# Patient Record
Sex: Male | Born: 1965 | Race: Black or African American | Hispanic: No | Marital: Married | Smoking: Former smoker
Health system: Southern US, Community
[De-identification: ages and names within clinical notes are randomized; demographics above are authoritative.]

## PROBLEM LIST (undated history)

## (undated) DIAGNOSIS — G4700 Insomnia, unspecified: Secondary | ICD-10-CM

## (undated) DIAGNOSIS — R011 Cardiac murmur, unspecified: Secondary | ICD-10-CM

## (undated) DIAGNOSIS — G4733 Obstructive sleep apnea (adult) (pediatric): Secondary | ICD-10-CM

## (undated) DIAGNOSIS — I2089 Other forms of angina pectoris: Secondary | ICD-10-CM

## (undated) DIAGNOSIS — F419 Anxiety disorder, unspecified: Secondary | ICD-10-CM

## (undated) DIAGNOSIS — I25118 Atherosclerotic heart disease of native coronary artery with other forms of angina pectoris: Secondary | ICD-10-CM

## (undated) DIAGNOSIS — F1290 Cannabis use, unspecified, uncomplicated: Secondary | ICD-10-CM

## (undated) DIAGNOSIS — Z7289 Other problems related to lifestyle: Secondary | ICD-10-CM

## (undated) DIAGNOSIS — Z7901 Long term (current) use of anticoagulants: Secondary | ICD-10-CM

## (undated) DIAGNOSIS — E119 Type 2 diabetes mellitus without complications: Secondary | ICD-10-CM

## (undated) DIAGNOSIS — J45909 Unspecified asthma, uncomplicated: Secondary | ICD-10-CM

## (undated) DIAGNOSIS — M1990 Unspecified osteoarthritis, unspecified site: Secondary | ICD-10-CM

## (undated) DIAGNOSIS — G4730 Sleep apnea, unspecified: Secondary | ICD-10-CM

## (undated) DIAGNOSIS — I700 Atherosclerosis of aorta: Secondary | ICD-10-CM

## (undated) DIAGNOSIS — M109 Gout, unspecified: Secondary | ICD-10-CM

## (undated) DIAGNOSIS — T7840XA Allergy, unspecified, initial encounter: Secondary | ICD-10-CM

## (undated) DIAGNOSIS — J439 Emphysema, unspecified: Secondary | ICD-10-CM

## (undated) DIAGNOSIS — M5030 Other cervical disc degeneration, unspecified cervical region: Secondary | ICD-10-CM

## (undated) DIAGNOSIS — I70213 Atherosclerosis of native arteries of extremities with intermittent claudication, bilateral legs: Secondary | ICD-10-CM

## (undated) DIAGNOSIS — Z7902 Long term (current) use of antithrombotics/antiplatelets: Secondary | ICD-10-CM

## (undated) DIAGNOSIS — J302 Other seasonal allergic rhinitis: Secondary | ICD-10-CM

## (undated) DIAGNOSIS — H9319 Tinnitus, unspecified ear: Secondary | ICD-10-CM

## (undated) DIAGNOSIS — Z789 Other specified health status: Secondary | ICD-10-CM

## (undated) DIAGNOSIS — E785 Hyperlipidemia, unspecified: Secondary | ICD-10-CM

## (undated) DIAGNOSIS — I10 Essential (primary) hypertension: Secondary | ICD-10-CM

## (undated) DIAGNOSIS — K219 Gastro-esophageal reflux disease without esophagitis: Secondary | ICD-10-CM

## (undated) HISTORY — DX: Sleep apnea, unspecified: G47.30

## (undated) HISTORY — PX: KNEE SURGERY: SHX244

## (undated) HISTORY — PX: WISDOM TOOTH EXTRACTION: SHX21

## (undated) HISTORY — PX: BACK SURGERY: SHX140

## (undated) HISTORY — PX: CERVICAL DISC SURGERY: SHX588

## (undated) HISTORY — PX: ROTATOR CUFF REPAIR: SHX139

## (undated) HISTORY — DX: Allergy, unspecified, initial encounter: T78.40XA

## (undated) HISTORY — DX: Unspecified asthma, uncomplicated: J45.909

---

## 2007-03-31 DIAGNOSIS — M109 Gout, unspecified: Secondary | ICD-10-CM | POA: Insufficient documentation

## 2007-03-31 DIAGNOSIS — F32A Depression, unspecified: Secondary | ICD-10-CM | POA: Insufficient documentation

## 2008-11-27 DIAGNOSIS — Z9889 Other specified postprocedural states: Secondary | ICD-10-CM

## 2008-11-27 HISTORY — DX: Other specified postprocedural states: Z98.890

## 2008-11-27 HISTORY — PX: LEFT HEART CATH AND CORONARY ANGIOGRAPHY: CATH118249

## 2009-07-30 DIAGNOSIS — J309 Allergic rhinitis, unspecified: Secondary | ICD-10-CM | POA: Insufficient documentation

## 2009-12-17 DIAGNOSIS — F411 Generalized anxiety disorder: Secondary | ICD-10-CM | POA: Insufficient documentation

## 2009-12-23 ENCOUNTER — Encounter: Admission: RE | Admit: 2009-12-23 | Discharge: 2009-12-23 | Payer: Self-pay | Admitting: Sports Medicine

## 2010-01-22 ENCOUNTER — Ambulatory Visit (HOSPITAL_COMMUNITY): Admission: RE | Admit: 2010-01-22 | Discharge: 2010-01-23 | Payer: Self-pay | Admitting: Orthopedic Surgery

## 2010-04-15 DIAGNOSIS — L309 Dermatitis, unspecified: Secondary | ICD-10-CM | POA: Insufficient documentation

## 2010-05-03 ENCOUNTER — Encounter: Admission: RE | Admit: 2010-05-03 | Discharge: 2010-05-03 | Payer: Self-pay | Admitting: Sports Medicine

## 2010-11-05 ENCOUNTER — Encounter: Payer: Self-pay | Admitting: Neurology

## 2010-12-23 LAB — BASIC METABOLIC PANEL
BUN: 6 mg/dL (ref 6–23)
Calcium: 8.9 mg/dL (ref 8.4–10.5)
Creatinine, Ser: 0.87 mg/dL (ref 0.4–1.5)
GFR calc non Af Amer: >60 mL/min (ref >60)
Glucose, Bld: 170 mg/dL — ABNORMAL HIGH (ref 70–99)

## 2010-12-23 LAB — CBC
HCT: 41 % (ref 39.0–52.0)
MCV: 83.7 fL (ref 78.0–100.0)
Platelets: 221 10*3/uL (ref 150–400)
Platelets: 248 10*3/uL (ref 150–400)
RBC: 4.78 MIL/uL (ref 4.22–5.81)
RBC: 4.9 MIL/uL (ref 4.22–5.81)
WBC: 8.4 10*3/uL (ref 4.0–10.5)

## 2010-12-23 LAB — COMPREHENSIVE METABOLIC PANEL
AST: 30 U/L (ref 0–37)
Albumin: 4.3 g/dL (ref 3.5–5.2)
Alkaline Phosphatase: 62 U/L (ref 39–117)
CO2: 26 mEq/L (ref 19–32)
Calcium: 10 mg/dL (ref 8.4–10.5)
Chloride: 107 mEq/L (ref 96–112)
GFR calc Af Amer: >60 mL/min (ref >60)
GFR calc non Af Amer: >60 mL/min (ref >60)
Glucose, Bld: 124 mg/dL — ABNORMAL HIGH (ref 70–99)
Potassium: 5.1 mEq/L (ref 3.5–5.1)
Sodium: 138 mEq/L (ref 135–145)

## 2010-12-23 LAB — GLUCOSE, CAPILLARY
Glucose-Capillary: 142 mg/dL — ABNORMAL HIGH (ref 70–99)
Glucose-Capillary: 170 mg/dL — ABNORMAL HIGH (ref 70–99)
Glucose-Capillary: 172 mg/dL — ABNORMAL HIGH (ref 70–99)

## 2010-12-23 LAB — PROTIME-INR: INR: 0.98 (ref 0.00–1.49)

## 2011-01-22 DIAGNOSIS — E1169 Type 2 diabetes mellitus with other specified complication: Secondary | ICD-10-CM | POA: Insufficient documentation

## 2012-10-05 HISTORY — PX: ANTERIOR CERVICAL DECOMP/DISCECTOMY FUSION: SHX1161

## 2013-03-16 ENCOUNTER — Encounter: Payer: Self-pay | Admitting: *Deleted

## 2013-03-16 ENCOUNTER — Emergency Department (INDEPENDENT_AMBULATORY_CARE_PROVIDER_SITE_OTHER)
Admission: EM | Admit: 2013-03-16 | Discharge: 2013-03-16 | Disposition: A | Discharge status: Home / Self Care | Payer: BC Managed Care – PPO | Source: Home / Self Care | Attending: Family Medicine | Admitting: Family Medicine

## 2013-03-16 DIAGNOSIS — Z716 Tobacco abuse counseling: Secondary | ICD-10-CM

## 2013-03-16 DIAGNOSIS — F17200 Nicotine dependence, unspecified, uncomplicated: Secondary | ICD-10-CM

## 2013-03-16 DIAGNOSIS — J069 Acute upper respiratory infection, unspecified: Secondary | ICD-10-CM

## 2013-03-16 DIAGNOSIS — R062 Wheezing: Secondary | ICD-10-CM

## 2013-03-16 HISTORY — DX: Hyperlipidemia, unspecified: E78.5

## 2013-03-16 HISTORY — DX: Type 2 diabetes mellitus without complications: E11.9

## 2013-03-16 HISTORY — DX: Essential (primary) hypertension: I10

## 2013-03-16 MED ORDER — AZITHROMYCIN 250 MG PO TABS: ORAL_TABLET | ORAL | Status: DC

## 2013-03-16 MED ORDER — METHYLPREDNISOLONE ACETATE 80 MG/ML IJ SUSP
80.0000 mg | Freq: Once | INTRAMUSCULAR | Status: AC
  Administered 2013-03-16: 80 mg via INTRAMUSCULAR

## 2013-03-16 NOTE — ED Notes (Signed)
Sophia reports cough and congestion with HA x last night. Denies fever. Taken generic sinus and tussin OTC.

## 2013-03-16 NOTE — ED Provider Notes (Addendum)
History     CSN: [id number]  Arrival date & time 03/16/13  1728   First MD Initiated Contact with Patient 03/16/13 1729      Chief Complaint  Patient presents with  . URI   HPI  URI Symptoms Onset: 2 days  Description: rhinorrhea, nasal congestion, cough, wheezing  Modifying factors:  1/2 PPD smoker. Baseline diabetic, overall well controlled.   Symptoms Nasal discharge: yes Fever: no Sore throat: yes Cough: yes Wheezing: yes Ear pain: no GI symptoms: no Sick contacts: yes  Red Flags  Stiff neck: no Dyspnea: minimal  Rash: no Swallowing difficulty: no  Sinusitis Risk Factors Headache/face pain: no Double sickening: no tooth pain: no  Allergy Risk Factors Sneezing: no Itchy scratchy throat: no Seasonal symptoms: no  Flu Risk Factors Headache: no muscle aches: no severe fatigue: no   Past Medical History  Diagnosis Date  . Diabetes mellitus without complication   . Hypertension   . Hyperlipidemia     Past Surgical History  Procedure Laterality Date  . Rotator cuff repair    . Back surgery    . Knee surgery Right     Family History  Problem Relation Age of Onset  . Hypertension Mother   . Heart attack Father     History  Substance Use Topics  . Smoking status: Current Every Day Smoker -- 0.50 packs/day    Types: Cigarettes  . Smokeless tobacco: Never Used  . Alcohol Use: Yes     Comment: 6/week      Review of Systems  All other systems reviewed and are negative.    Allergies  Review of patient's allergies indicates no known allergies.  Home Medications   Current Outpatient Rx  Name  Route  Sig  Dispense  Refill  . albuterol (PROVENTIL) (2.5 MG/3ML) 0.083% nebulizer solution   Nebulization   Take 2.5 mg by nebulization every 6 (six) hours as needed for wheezing.         Marland Kitchen ALPRAZolam (XANAX) 0.25 MG tablet   Oral   Take 0.25 mg by mouth at bedtime as needed for sleep.         Marland Kitchen aspirin 81 MG tablet   Oral   Take 81  mg by mouth daily.         . cetirizine (ZYRTEC) 10 MG tablet   Oral   Take 10 mg by mouth daily.         Marland Kitchen glipiZIDE (GLUCOTROL XL) 10 MG 24 hr tablet   Oral   Take 10 mg by mouth daily.         . hydrochlorothiazide (HYDRODIURIL) 25 MG tablet   Oral   Take 25 mg by mouth daily.         Marland Kitchen lisinopril (PRINIVIL,ZESTRIL) 40 MG tablet   Oral   Take 40 mg by mouth daily.         . metFORMIN (GLUCOPHAGE) 500 MG tablet   Oral   Take 500 mg by mouth 2 (two) times daily with a meal.         . rosuvastatin (CRESTOR) 40 MG tablet   Oral   Take 40 mg by mouth daily.         . vitamin C (ASCORBIC ACID) 500 MG tablet   Oral   Take 500 mg by mouth daily.           BP 117/74  Pulse 100  Temp(Src) 99.2 F (37.3 C) (Oral)  Resp 16  Ht 5\' 10"  (1.778 m)  Wt 242 lb (109.77 kg)  BMI 34.72 kg/m2  SpO2 99%  Physical Exam  Constitutional:  Obese    HENT:  Head: Normocephalic and atraumatic.  Right Ear: External ear normal.  Left Ear: External ear normal.  +nasal erythema, rhinorrhea bilaterally, + post oropharyngeal erythema    Eyes: Conjunctivae are normal. Pupils are equal, round, and reactive to light.  Neck: Normal range of motion.  Cardiovascular: Normal rate and regular rhythm.   Pulmonary/Chest: Effort normal. He has wheezes.  Abdominal: Soft.  Musculoskeletal: Normal range of motion.  Neurological: He is alert.  Skin: Skin is warm.    ED Course  Procedures (including critical care time)  Labs Reviewed - No data to display No results found.   1. URI (upper respiratory infection)   2. Wheezing   3. Tobacco abuse counseling       MDM  Suspect undelrying viral illness with ? Secondary obstructive lung disease exacerbation.  Depomedrol 80mg  IM x1 given wheezing. Watch sugars with this medication.  Discussed infectious and resp red flags. Also discussed smoking cessation.  Zpak if sxs worsen as pt is going to [place] in next 2-3 days.  Follow  up as needed.      The patient and/or caregiver has been counseled thoroughly with regard to treatment plan and/or medications prescribed including dosage, schedule, interactions, rationale for use, and possible side effects and they verbalize understanding. Diagnoses and expected course of recovery discussed and will return if not improved as expected or if the condition worsens. Patient and/or caregiver verbalized understanding.             Doree Albee, MD 03/16/13 1753  Doree Albee, MD 03/16/13 1754

## 2014-07-17 DIAGNOSIS — R0683 Snoring: Secondary | ICD-10-CM | POA: Insufficient documentation

## 2014-07-17 DIAGNOSIS — G4710 Hypersomnia, unspecified: Secondary | ICD-10-CM

## 2014-07-17 DIAGNOSIS — G4733 Obstructive sleep apnea (adult) (pediatric): Secondary | ICD-10-CM | POA: Insufficient documentation

## 2014-07-17 HISTORY — DX: Hypersomnia, unspecified: G47.10

## 2016-07-17 ENCOUNTER — Encounter: Payer: Self-pay | Admitting: Nurse Practitioner

## 2017-11-24 DIAGNOSIS — M1A00X0 Idiopathic chronic gout, unspecified site, without tophus (tophi): Secondary | ICD-10-CM | POA: Insufficient documentation

## 2018-02-21 DIAGNOSIS — Z7689 Persons encountering health services in other specified circumstances: Secondary | ICD-10-CM | POA: Insufficient documentation

## 2018-06-17 ENCOUNTER — Other Ambulatory Visit: Payer: Self-pay

## 2018-06-17 ENCOUNTER — Encounter: Payer: Self-pay | Admitting: Emergency Medicine

## 2018-06-17 ENCOUNTER — Ambulatory Visit
Admission: EM | Admit: 2018-06-17 | Discharge: 2018-06-17 | Disposition: A | Discharge status: Home / Self Care | Payer: 59 | Attending: Family Medicine | Admitting: Family Medicine

## 2018-06-17 DIAGNOSIS — Z79899 Other long term (current) drug therapy: Secondary | ICD-10-CM | POA: Diagnosis not present

## 2018-06-17 DIAGNOSIS — R142 Eructation: Secondary | ICD-10-CM | POA: Diagnosis not present

## 2018-06-17 DIAGNOSIS — Z87891 Personal history of nicotine dependence: Secondary | ICD-10-CM | POA: Insufficient documentation

## 2018-06-17 DIAGNOSIS — I10 Essential (primary) hypertension: Secondary | ICD-10-CM | POA: Diagnosis not present

## 2018-06-17 DIAGNOSIS — E119 Type 2 diabetes mellitus without complications: Secondary | ICD-10-CM | POA: Insufficient documentation

## 2018-06-17 DIAGNOSIS — E785 Hyperlipidemia, unspecified: Secondary | ICD-10-CM | POA: Diagnosis not present

## 2018-06-17 DIAGNOSIS — Z8249 Family history of ischemic heart disease and other diseases of the circulatory system: Secondary | ICD-10-CM | POA: Insufficient documentation

## 2018-06-17 DIAGNOSIS — Z7984 Long term (current) use of oral hypoglycemic drugs: Secondary | ICD-10-CM | POA: Diagnosis not present

## 2018-06-17 DIAGNOSIS — Z7982 Long term (current) use of aspirin: Secondary | ICD-10-CM | POA: Insufficient documentation

## 2018-06-17 DIAGNOSIS — K219 Gastro-esophageal reflux disease without esophagitis: Secondary | ICD-10-CM | POA: Insufficient documentation

## 2018-06-17 DIAGNOSIS — R0789 Other chest pain: Secondary | ICD-10-CM | POA: Diagnosis not present

## 2018-06-17 HISTORY — DX: Cardiac murmur, unspecified: R01.1

## 2018-06-17 LAB — CBC WITH DIFFERENTIAL/PLATELET
Basophils Absolute: 0.1 10*3/uL (ref 0–0.1)
Basophils Relative: 1 %
EOS ABS: 0.3 10*3/uL (ref 0–0.7)
EOS PCT: 4 %
HCT: 45.4 % (ref 40.0–52.0)
Hemoglobin: 14.9 g/dL (ref 13.0–18.0)
LYMPHS ABS: 3.9 10*3/uL — AB (ref 1.0–3.6)
LYMPHS PCT: 55 %
MCH: 28.2 pg (ref 26.0–34.0)
MCHC: 32.8 g/dL (ref 32.0–36.0)
MCV: 86 fL (ref 80.0–100.0)
Monocytes Absolute: 0.5 10*3/uL (ref 0.2–1.0)
Monocytes Relative: 7 %
Neutro Abs: 2.5 10*3/uL (ref 1.4–6.5)
Neutrophils Relative %: 35 %
PLATELETS: 251 10*3/uL (ref 150–440)
RBC: 5.27 MIL/uL (ref 4.40–5.90)
RDW: 13.4 % (ref 11.5–14.5)
WBC: 7.2 10*3/uL (ref 3.8–10.6)

## 2018-06-17 LAB — COMPREHENSIVE METABOLIC PANEL
ALK PHOS: 59 U/L (ref 38–126)
ALT: 38 U/L (ref 0–44)
ANION GAP: 12 (ref 5–15)
AST: 31 U/L (ref 15–41)
Albumin: 4.2 g/dL (ref 3.5–5.0)
BUN: 14 mg/dL (ref 6–20)
CHLORIDE: 106 mmol/L (ref 98–111)
CO2: 21 mmol/L — AB (ref 22–32)
Calcium: 9.4 mg/dL (ref 8.9–10.3)
Creatinine, Ser: 0.92 mg/dL (ref 0.61–1.24)
GFR calc Af Amer: >60 mL/min (ref >60)
Glucose, Bld: 144 mg/dL — ABNORMAL HIGH (ref 70–99)
Potassium: 4.3 mmol/L (ref 3.5–5.1)
SODIUM: 139 mmol/L (ref 135–145)
TOTAL PROTEIN: 7.1 g/dL (ref 6.5–8.1)
Total Bilirubin: 0.6 mg/dL (ref 0.3–1.2)

## 2018-06-17 LAB — TROPONIN I

## 2018-06-17 MED ORDER — PANTOPRAZOLE SODIUM 40 MG PO TBEC: 40.0000 mg | DELAYED_RELEASE_TABLET | Freq: Every day | ORAL | 1 refills | Status: DC

## 2018-06-17 NOTE — ED Triage Notes (Signed)
Patient in today c/o indigestion, heavy type of feeling in his chest x 1 week. Patient states it is worse in the evenings and night when he lies down to sleep. Patient states it does get better after passing gas. Patient has used Catering managerAlka Seltzer and he does feel better. Patient's wife gave him a GI Cocktail which patient states worked [employer]. He celebrated his birthday yesterday and drank beer and ate after 8pm and symptoms were worse last night.

## 2018-06-17 NOTE — Discharge Instructions (Signed)
Medication as prescribed.  If persists, consider seeing cardiology for stress test.  Take care  Dr. Adriana Simasook

## 2018-06-17 NOTE — ED Provider Notes (Signed)
[facility]    CSN: [id number] Arrival date & time: 06/17/18  1354  History   Chief Complaint Chief Complaint  Patient presents with  . Gastroesophageal Reflux   HPI  52 year old male presents with the above complaint.  1 week history of heartburn, belching.  Weight worse in the evenings.  Has been worse at night.  He has been using Alka-Seltzer with improvement but no resolution.  He has also used GI cocktail.  Heartburn is more severe than usual.  He has had some associated diaphoresis.  No shortness of breath.  No exertional component.  He is recently started smoking.  Additionally, he has recently started a new medication (Trulicity).  Pain described as a burning sensation.  Located in the center of the sternum.  No radiation.  No other associated symptoms.  No other complaints.   PMH, Surgical Hx, Family Hx, Social History reviewed and updated as below.  Past Medical History:  Diagnosis Date  . Diabetes mellitus without complication (HCC)   . Heart murmur   . Hyperlipidemia   . Hypertension    Past Surgical History:  Procedure Laterality Date  . BACK SURGERY     cervical  . KNEE SURGERY Right   . ROTATOR CUFF REPAIR    . WISDOM TOOTH EXTRACTION     Home Medications    Prior to Admission medications   Medication Sig Start Date End Date Taking? Authorizing Provider  ALPRAZolam Prudy Feeler(XANAX) 0.25 MG tablet Take 0.25 mg by mouth at bedtime as needed for sleep.   Yes [provider]  aspirin 81 MG tablet Take 81 mg by mouth daily.   Yes [provider]  Canagliflozin-metFORMIN HCl (INVOKAMET PO) Take 2 capsules by mouth daily.   Yes [provider]  cetirizine (ZYRTEC) 10 MG tablet Take 10 mg by mouth daily.   Yes [provider]  glipiZIDE (GLUCOTROL XL) 10 MG 24 hr tablet Take 10 mg by mouth daily.   Yes [provider]  hydrochlorothiazide (HYDRODIURIL) 25 MG tablet Take 25 mg by mouth daily.   Yes [provider]  lisinopril (PRINIVIL,ZESTRIL) 40 MG tablet Take 40 mg by mouth daily.   Yes [provider]  Multiple Vitamin (MULTIVITAMIN) tablet Take 1 tablet by mouth daily.   Yes [provider]  rosuvastatin (CRESTOR) 40 MG tablet Take 40 mg by mouth daily.   Yes [provider]  pantoprazole (PROTONIX) 40 MG tablet Take 1 tablet (40 mg total) by mouth daily. 06/17/18   Tommie Samsook, Damontre Millea G, DO   Family History Family History  Problem Relation Age of Onset  . Heart attack Father   . Hypertension Mother    Social History Social History   Tobacco Use  . Smoking status: Former Smoker    Packs/day: 0.50    Types: Cigarettes    Last attempt to quit: 06/11/2018    Years since quitting: 0.0  . Smokeless tobacco: Never Used  Substance Use Topics  . Alcohol use: Yes    Comment: 6/week  . Drug use: No   Allergies   Patient has no known allergies.  Review of Systems Review of Systems  Constitutional: Negative.   Cardiovascular: Positive for chest pain.  Gastrointestinal:       GERD.   Physical Exam Triage Vital Signs ED Triage Vitals  Enc Vitals Group     BP 06/17/18 1414 99/80     Pulse Rate 06/17/18 1414 71     Resp 06/17/18 1414  16     Temp 06/17/18 1414 97.7 F (36.5 C)     Temp Source 06/17/18 1414 Oral     SpO2 06/17/18 1414 100 %     Weight 06/17/18 1415 240 lb (108.9 kg)     Height 06/17/18 1415 5\' 10"  (1.778 m)     Head Circumference --      Peak Flow --      Pain Score 06/17/18 1414 0     Pain Loc --      Pain Edu? --      Excl. in GC? --    Updated Vital Signs BP 99/80 (BP Location: Left Arm)   Pulse 71   Temp 97.7 F (36.5 C) (Oral)   Resp 16   Ht 5\' 10"  (1.778 m)   Wt 108.9 kg   SpO2 100%   BMI 34.44 kg/m   Visual Acuity Right Eye Distance:   Left Eye Distance:   Bilateral Distance:    Right Eye Near:   Left Eye Near:    Bilateral Near:     Physical Exam  Constitutional: He is oriented to person, place, and time. He appears  well-developed. No distress.  Cardiovascular: Normal rate and regular rhythm.  Pulmonary/Chest: Effort normal and breath sounds normal. He has no wheezes. He has no rales.  Abdominal: Soft. He exhibits no distension. There is no tenderness.  Neurological: He is alert and oriented to person, place, and time.  Psychiatric: He has a normal mood and affect. His behavior is normal.  Nursing note and vitals reviewed.  UC Treatments / Results  Labs (all labs ordered are listed, but only abnormal results are displayed) Labs Reviewed  CBC WITH DIFFERENTIAL/PLATELET - Abnormal; Notable for the following components:      Result Value   Lymphs Abs 3.9 (*)    All other components within normal limits  COMPREHENSIVE METABOLIC PANEL - Abnormal; Notable for the following components:   CO2 21 (*)    Glucose, Bld 144 (*)    All other components within normal limits  TROPONIN I    EKG Interpreted patient: Normal sinus rhythm with rate of 70.  Normal axis.  Normal intervals.  No ST-T wave changes.  Normal EKG.  Radiology No results found.  Procedures Procedures (including critical care time)  Medications Ordered in UC Medications - No data to display  Initial Impression / Assessment and Plan / UC Course  I have reviewed the triage vital signs and the nursing notes.  Pertinent labs & imaging results that were available during my care of the patient were reviewed by me and considered in my medical decision making (see chart for details).    52 year old male presents with GERD.  This is likely exacerbated by Trulicity as well as dietary indiscretion.  Labs and EKG unremarkable.  Placing on Protonix.   Final Clinical Impressions(s) / UC Diagnoses   Final diagnoses:  Gastroesophageal reflux disease without esophagitis     Discharge Instructions     Medication as prescribed.  If persists, consider seeing cardiology for stress test.  Take care  Dr. Adriana Simas    ED Prescriptions     Medication Sig Dispense Auth. Provider   pantoprazole (PROTONIX) 40 MG tablet Take 1 tablet (40 mg total) by mouth daily. 30 tablet Tommie Sams, DO     Controlled Substance Prescriptions [place] Controlled Substance Registry consulted? Not Applicable   Tommie Sams, DO 06/17/18 1608

## 2018-12-19 ENCOUNTER — Ambulatory Visit: Payer: 59 | Admitting: Podiatry

## 2018-12-19 ENCOUNTER — Encounter: Payer: Self-pay | Admitting: Podiatry

## 2018-12-19 ENCOUNTER — Other Ambulatory Visit: Payer: Self-pay

## 2018-12-19 VITALS — BP 121/74 | HR 82

## 2018-12-19 DIAGNOSIS — M79675 Pain in left toe(s): Secondary | ICD-10-CM | POA: Diagnosis not present

## 2018-12-19 DIAGNOSIS — B351 Tinea unguium: Secondary | ICD-10-CM | POA: Diagnosis not present

## 2018-12-19 DIAGNOSIS — M79674 Pain in right toe(s): Secondary | ICD-10-CM

## 2018-12-19 DIAGNOSIS — E1159 Type 2 diabetes mellitus with other circulatory complications: Secondary | ICD-10-CM | POA: Diagnosis not present

## 2018-12-19 NOTE — Progress Notes (Signed)
This patient presents to the office with chief complaint of long thick nails and diabetic feet.  This patient  says there  is  no pain and discomfort in his  feet.  This patient says there are long thick painful nails. Previous surgery for removal of left great toenail.  These nails are painful walking and wearing shoes.  Patient has no history of infection or drainage from both feet.  Patient is unable to  self treat his own nails . This patient presents  to the office today for treatment of the  long nails and a foot evaluation due to history of  Diabetes. Previous history of gout noted.  General Appearance  Alert, conversant and in no acute stress.  Vascular  Dorsalis pedis  are palpable  bilaterally. Posterior tibial pulses are weakly palpable bilateral. Capillary return is within normal limits  bilaterally. Temperature is within normal limits  bilaterally.  Neurologic  Senn-Weinstein monofilament wire test within normal limits  bilaterally. Muscle power within normal limits bilaterally.  Nails Thick disfigured discolored nails with subungual debris  from hallux to fifth toes bilaterally. No evidence of bacterial infection or drainage bilaterally.  Orthopedic  No limitations of motion of motion feet .  No crepitus or effusions noted.  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  Skin  normotropic skin with no porokeratosis noted bilaterally.  No signs of infections or ulcers noted.  Pinch callus right hallux   Onychomycosis  Diabetes with angiopathy  IE  Debride nails x 10.  A diabetic foot exam was performed and there is no evidence of  neurologic pathology.  Vascular pathology noted.  RTC 1 year.Helane Gunther DPM

## 2019-04-11 ENCOUNTER — Other Ambulatory Visit: Payer: Self-pay | Admitting: *Deleted

## 2019-04-11 DIAGNOSIS — Z20822 Contact with and (suspected) exposure to covid-19: Secondary | ICD-10-CM

## 2019-04-17 LAB — NOVEL CORONAVIRUS, NAA: SARS-CoV-2, NAA: NOT DETECTED

## 2019-04-24 ENCOUNTER — Other Ambulatory Visit: Payer: Self-pay | Admitting: *Deleted

## 2019-04-24 DIAGNOSIS — Z20822 Contact with and (suspected) exposure to covid-19: Secondary | ICD-10-CM

## 2019-11-23 ENCOUNTER — Ambulatory Visit: Payer: Self-pay

## 2019-11-27 ENCOUNTER — Ambulatory Visit: Payer: 59 | Attending: Family

## 2019-11-27 DIAGNOSIS — Z23 Encounter for immunization: Secondary | ICD-10-CM

## 2019-11-27 NOTE — Progress Notes (Signed)
Covid-19 Vaccination Clinic  Name:  Kevin Huerta    MRN: [id number] DOB: [birth year]  11/27/2019  Kevin Huerta was observed post Covid-19 immunization for 15 minutes without incidence. He was provided with Vaccine Information Sheet and instruction to access the V-Safe system.   Kevin Huerta was instructed to call 911 with any severe reactions post vaccine: Marland Kitchen Difficulty breathing  . Swelling of your face and throat  . A fast heartbeat  . A bad rash all over your body  . Dizziness and weakness    Immunizations Administered    Name Date Dose VIS Date Route   Moderna COVID-19 Vaccine 11/27/2019  2:07 PM 0.5 mL 09/05/2019 Intramuscular   Manufacturer: Moderna   Lot: [id number]   NDC: 13086-578-46

## 2019-12-29 LAB — HM DIABETES EYE EXAM

## 2020-01-02 ENCOUNTER — Ambulatory Visit: Payer: 59 | Attending: Family

## 2020-01-02 DIAGNOSIS — Z23 Encounter for immunization: Secondary | ICD-10-CM

## 2020-01-02 NOTE — Progress Notes (Signed)
Covid-19 Vaccination Clinic  Name:  Kevin Huerta    MRN: [id number] DOB: 12/27/1965  01/02/2020  Kevin Huerta was observed post Covid-19 immunization for 15 minutes without incident. He was provided with Vaccine Information Sheet and instruction to access the V-Safe system.   Kevin Huerta was instructed to call 911 with any severe reactions post vaccine: Marland Kitchen Difficulty breathing  . Swelling of face and throat  . A fast heartbeat  . A bad rash all over body  . Dizziness and weakness   Immunizations Administered    Name Date Dose VIS Date Route   Moderna COVID-19 Vaccine 01/02/2020  2:12 PM 0.5 mL 09/05/2019 Intramuscular   Manufacturer: Moderna   Lot: [id number]   NDC: 14782-956-21

## 2020-01-29 ENCOUNTER — Ambulatory Visit: Payer: 59 | Admitting: Podiatry

## 2020-01-29 ENCOUNTER — Other Ambulatory Visit: Payer: Self-pay

## 2020-01-29 ENCOUNTER — Encounter: Payer: Self-pay | Admitting: *Deleted

## 2020-01-29 ENCOUNTER — Encounter: Payer: Self-pay | Admitting: Podiatry

## 2020-01-29 ENCOUNTER — Other Ambulatory Visit: Payer: Self-pay | Admitting: *Deleted

## 2020-01-29 VITALS — Temp 98.4°F

## 2020-01-29 DIAGNOSIS — S99922A Unspecified injury of left foot, initial encounter: Secondary | ICD-10-CM

## 2020-01-29 DIAGNOSIS — M129 Arthropathy, unspecified: Secondary | ICD-10-CM | POA: Insufficient documentation

## 2020-01-29 DIAGNOSIS — M25532 Pain in left wrist: Secondary | ICD-10-CM | POA: Insufficient documentation

## 2020-01-29 DIAGNOSIS — G8929 Other chronic pain: Secondary | ICD-10-CM | POA: Insufficient documentation

## 2020-01-29 MED ORDER — MELOXICAM 7.5 MG PO TABS: 7.5000 mg | ORAL_TABLET | Freq: Every day | ORAL | 0 refills | Status: DC

## 2020-01-29 NOTE — Progress Notes (Signed)
This patient presents the office with chief complaint of a sore painful throbbing left foot which has been improving since he first injured it 5 weeks ago.  He says he is still is concerned about the presence of throbbing pain during sleep and presents the office for an evaluation.  He also has a rash on the outside anklebone of his right foot which has not completely resolved.  He says it developed 1 week ago but there is still evidence of healing/rash  on the right ankle.  This patient is a diabetic and he also presents to the office for his annual diabetic foot exam.  Vascular  Dorsalis pedis  are palpable  B/L. Posterior tibial pulses are weakly palpable.   Capillary return  WNL.  Temperature gradient is  WNL.  Skin turgor  WNL  Sensorium  Senn Weinstein monofilament wire  WNL. Normal tactile sensation.  Nail Exam  Patient has normal nails with no evidence of bacterial or fungal infection.  Orthopedic  Exam  Muscle tone and muscle strength  WNL.  No limitations of motion feet  B/L.  No crepitus or joint effusion noted.  Foot type is unremarkable and digits show no abnormalities.  HAV 1st MPJ  B/L.  IPJ hallux  B/L. Palpable pain noted 1st and 2nd  MCJ left foot.  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  Skin  No open lesions.  Normal skin texture and turgor. Pinch callus  B/L Healing skin lesion right malleoli.   Arthritis left foot.  Skin lesion right ankle.  Diabetes with vascular pathology.  No evidence of neurologic discomfort.  Prescribe cortaid for application to skin lesion.  Prescribe Mobic to be taken po.  RTC prn  Helane Gunther DPM

## 2020-02-05 ENCOUNTER — Telehealth: Payer: Self-pay | Admitting: Family Medicine

## 2020-02-05 NOTE — Telephone Encounter (Signed)
Pt called to schedule a New Patient appointment  Scheduled  him for 02/22/20 @3pm  When telling him that he needed to arrive 15 minutes before his appointment to fill out New patient paperwork and for to get his insurance information in the chart. He said that he wouldn't come 15 minutes early and we needed to mail everything to him. I told him at this time we are not sending out new patient paperwork because of Covid and he kept telling me that I was interrupting him and when was I going to let him talk throughout our conversation. I spoke to [place] and she said that he could come in and give [place] his insurance information and fill out the new patient paperwork in office prior to the appointment day and that he had to arrive no later than 3pm on 5/20/21or we would have to reschedule. Pt was given information and he understood

## 2020-02-22 ENCOUNTER — Encounter: Payer: Self-pay | Admitting: Nurse Practitioner

## 2020-02-22 ENCOUNTER — Ambulatory Visit (INDEPENDENT_AMBULATORY_CARE_PROVIDER_SITE_OTHER): Payer: 59

## 2020-02-22 ENCOUNTER — Other Ambulatory Visit: Payer: Self-pay

## 2020-02-22 ENCOUNTER — Ambulatory Visit: Payer: 59 | Admitting: Nurse Practitioner

## 2020-02-22 ENCOUNTER — Other Ambulatory Visit: Payer: Self-pay | Admitting: Podiatry

## 2020-02-22 VITALS — BP 121/76 | HR 82 | Temp 97.4°F | Ht 70.0 in | Wt 246.0 lb

## 2020-02-22 DIAGNOSIS — I10 Essential (primary) hypertension: Secondary | ICD-10-CM | POA: Diagnosis not present

## 2020-02-22 DIAGNOSIS — E1169 Type 2 diabetes mellitus with other specified complication: Secondary | ICD-10-CM

## 2020-02-22 DIAGNOSIS — M129 Arthropathy, unspecified: Secondary | ICD-10-CM

## 2020-02-22 DIAGNOSIS — Z7689 Persons encountering health services in other specified circumstances: Secondary | ICD-10-CM | POA: Diagnosis not present

## 2020-02-22 DIAGNOSIS — J309 Allergic rhinitis, unspecified: Secondary | ICD-10-CM

## 2020-02-22 DIAGNOSIS — E785 Hyperlipidemia, unspecified: Secondary | ICD-10-CM

## 2020-02-22 DIAGNOSIS — G4733 Obstructive sleep apnea (adult) (pediatric): Secondary | ICD-10-CM

## 2020-02-22 DIAGNOSIS — M25532 Pain in left wrist: Secondary | ICD-10-CM | POA: Diagnosis not present

## 2020-02-22 NOTE — Progress Notes (Addendum)
New Patient Office Visit  Subjective:  Patient ID: Kevin Huerta, male    DOB: [birth year]  Age: 54 y.o. MRN: [id number]  CC:  Chief Complaint  Patient presents with  . New Patient (Initial Visit)    establish care    HPI Kevin Huerta is a 54 yo male who presents to establish care with a new primary care provider.  He  moved to [place] 2 years ago and is finding it too difficult to commute to his longtime family physician in [place]. He has 2 concerns today: 1. wrist pain and 2. he would like to get his blood pressure cuff calibrated.     Left wrist pain: No injury or trauma. Pain onset >6 weeks- more at night-aches, also when he flexes his wrist and pushes himself out of chair. It hurts at the  thumb joint area. No swelling or gout in that join that he is aware. No erythema or warmth. He is a daily typist - Scientist, product/process development. No history of carpal tunnel disease. He is followed by [facility] for gout.   WUJ:[id number] on lisinopril 40 mg daily. He tolerates this well without cough or angioedema. He does have chronic hyperkalemia with normal renal function. No salt substitutes, no sports drinks, Gatorade only if he get cramps in legs- maybe monthly. He reports  his past providers could not tell him why he runs higher than normal potassium.  He denies any chest pain, palpitations, dizziness, lightheadedness, or edema.  BP Readings from Last 3 Encounters:  02/22/20 121/76  12/19/18 121/74  06/17/18 99/80    T2DM: Dx in 1996. Several medicines used in the past with exception of insulin. He presents on Invokamet [id number]  takes 2 pills once a day once a day. He is also on Glipizide 5 mg daily. No hypoglycemia symptoms. He does not check his blood sugar. He stopped eating healthy last year. A1c 9.8. He got back on his healthy eating and A1c has improved to 8.1 on 01/17/2020.    HTN: Dx in 2015 lisinopril 40 mg Cardiology Dr. Lady Gary following.  Foot pain-arthritis : He saw Dr. Stacie Acres podiatrist  for exam and gave him Meloxicam for acute foot pain. GOUT: Dx 30 years ago and reports stable on Allopurinol followed by Dr. Renard Matter in [facility] and takes as needed colchicine 0.6 mg. Uric acid 4.0 on 06/20/2019.  Anxiety: Dx in 2015 - Off Lexapro x 9 mos  stopped it because of ED side effects. He sees a psychiatrist,  Dr. Shelly Coss and a behavioral therapist, Dr. Thornell Sartorius for anxiety/panic attacks. He gets those when on airport tarmac, traffic jams, with claustrophilia.  He takes  Xanax once a month.  HLD: Dx in  2015 and is stable on Lipitor 40 mg. He has no muscle aches. Labs 01/24/2020: Chol 136, trigly 115, HDL, 37.7, LDL 75.  Obesity: BMI 35.30  Allergic rhinitis: Zyrtec working well.  GERD: Takes omeprazole 40 mg as needed-  not taking   Asthma: Dx in childhood and into adulthood. Using Prorair as needed. Last few times, he used it for anxiety and it made him feel better-SOB-anxiety. No wheezing. Wearing masks are a struggle.  Fatty liver; liver BX x 20 years ago. No current concerns.  Sleep apnea: Dx years ago and mild and never treated. Wakes up at night x today to void once and watch TV. Not snoring.  Immunizations: Covid Moderna 11/27/19 and 01/02/2020 Diet:working on it  Exercise:no- regular Colonoscopy:done Vision:UTD Dentist: UTD  No results  found for: HGBA1C Past Medical History:  Diagnosis Date  . Asthma   . Diabetes mellitus without complication (HCC)   . Heart murmur   . Hyperlipidemia   . Hypertension     Past Surgical History:  Procedure Laterality Date  . BACK SURGERY     cervical  . KNEE SURGERY Right   . ROTATOR CUFF REPAIR    . WISDOM TOOTH EXTRACTION      Family History  Problem Relation Age of Onset  . Heart attack Father   . Alcohol abuse Father   . Drug abuse Father   . Early death Father   . Heart disease Father   . Hypertension Mother     Social History   Socioeconomic History  . Marital status: Married    Spouse name: Not on  file  . Number of children: Not on file  . Years of education: Not on file  . Highest education level: Not on file  Occupational History  . Occupation: IT   Tobacco Use  . Smoking status: Former Smoker    Packs/day: 0.50    Years: 20.00    Pack years: 10.00    Types: Cigarettes    Quit date: 06/11/2018    Years since quitting: 1.7  . Smokeless tobacco: Former Neurosurgeon    Types: Snuff    Quit date: 1996  Substance and Sexual Activity  . Alcohol use: Yes    Comment: beer up to 6 week and none recently  . Drug use: No  . Sexual activity: Yes    Partners: Female  Other Topics Concern  . Not on file  Social History Narrative   Married and lives with daughter    Social Determinants of Health   Financial Resource Strain:   . Difficulty of Paying Living Expenses:   Food Insecurity:   . Worried About Programme researcher, broadcasting/film/video in the Last Year:   . Barista in the Last Year:   Transportation Needs:   . Freight forwarder (Medical):   Marland Kitchen Lack of Transportation (Non-Medical):   Physical Activity:   . Days of Exercise per Week:   . Minutes of Exercise per Session:   Stress:   . Feeling of Stress :   Social Connections:   . Frequency of Communication with Friends and Family:   . Frequency of Social Gatherings with Friends and Family:   . Attends Religious Services:   . Active Member of Clubs or Organizations:   . Attends Banker Meetings:   Marland Kitchen Marital Status:   Intimate Partner Violence:   . Fear of Current or Ex-Partner:   . Emotionally Abused:   Marland Kitchen Physically Abused:   . Sexually Abused:     ROS Review of Systems  Constitutional: Negative for chills and fever.  HENT: Negative for congestion.   Eyes: Negative.   Respiratory: Negative for cough and shortness of breath.   Cardiovascular: Negative for chest pain and leg swelling.  Gastrointestinal: Negative for abdominal pain.  Genitourinary: Negative for difficulty urinating.  Musculoskeletal:       See  HPI  Skin: Negative for rash.  Allergic/Immunologic: Negative for environmental allergies.  Neurological: Negative for dizziness and headaches.  Hematological: Negative for adenopathy. Does not bruise/bleed easily.  Psychiatric/Behavioral:       See HPI. GAD-&: 4. No SI/HI.     Objective:   Today's Vitals: BP 121/76 (BP Location: Left Arm, Patient Position: Sitting, Cuff Size: Normal)   Pulse  82   Temp (!) 97.4 F (36.3 C) (Skin)   Ht 5\' 10"  (1.778 m)   Wt 246 lb (111.6 kg)   SpO2 97%   BMI 35.30 kg/m   Physical Exam Vitals reviewed.  Constitutional:      Appearance: Normal appearance.  HENT:     Head: Normocephalic and atraumatic.  Eyes:     Pupils: Pupils are equal, round, and reactive to light.  Cardiovascular:     Rate and Rhythm: Normal rate and regular rhythm.     Pulses: Normal pulses.     Heart sounds: Normal heart sounds.  Pulmonary:     Effort: Pulmonary effort is normal.     Breath sounds: Normal breath sounds.  Abdominal:     Palpations: Abdomen is soft.     Tenderness: There is no abdominal tenderness.  Musculoskeletal:        General: Normal range of motion.     Cervical back: Normal range of motion and neck supple.     Comments: Left wrist normal in appearance, slight tender snuff box. No swelling or deformity. No gout.   Skin:    General: Skin is warm and dry.  Neurological:     General: No focal deficit present.     Mental Status: He is alert and oriented to person, place, and time.  Psychiatric:        Mood and Affect: Mood normal.        Behavior: Behavior normal.        Thought Content: Thought content normal.        Judgment: Judgment normal.    CLINICAL DATA:  LEFT wrist pain for 6 weeks. No known trauma. Tenderness at the snuffbox.  EXAM: LEFT WRIST - 2 VIEW  COMPARISON:  None.  FINDINGS: There is no evidence of fracture or dislocation. There is no evidence of arthropathy or other focal bone abnormality. Soft tissues are  unremarkable.  IMPRESSION: Negative.   Electronically Signed   By: Norva Pavlov M.D.   On: 02/23/2020 08:23 Assessment & Plan:   Problem List Items Addressed This Visit      Respiratory   OSA (obstructive sleep apnea)     Other   Establishing care with new doctor, encounter for - Primary   Left wrist pain   Relevant Orders   DG Wrist 2 Views Left (Completed)   PR WHO COCK-UP NONMOLDE PRE OTS      Outpatient Encounter Medications as of 02/22/2020  Medication Sig  . allopurinol (ZYLOPRIM) 100 MG tablet 2 tabs daily, 90 days  . ALPRAZolam (XANAX) 0.5 MG tablet Take by mouth.  Marland Kitchen aspirin 81 MG tablet Take 81 mg by mouth daily.  Marland Kitchen atorvastatin (LIPITOR) 40 MG tablet TAKE 1 TABLET DAILY  . Blood Glucose Monitoring Suppl (ONETOUCH VERIO FLEX SYSTEM) w/Device KIT by Does not apply route.  . Canagliflozin-metFORMIN HCl ER (INVOKAMET XR) [phone] MG TB24 Take by mouth.  . cetirizine (ZYRTEC) 10 MG tablet Take 10 mg by mouth daily.  . colchicine 0.6 MG tablet Take 2 tablets (1.2mg ) by mouth at first sign of gout flare followed by 1 tablet (0.6mg ) after 1 hour. (Max 1.8mg  within 1 hour)  . glipiZIDE (GLUCOTROL XL) 5 MG 24 hr tablet Take by mouth.  Marland Kitchen ketoconazole (NIZORAL) 2 % cream APPLY TOPICALLY TWICE A DAY AS NEEDED  . lisinopril (ZESTRIL) 40 MG tablet Take by mouth.  . Multiple Vitamin (MULTI-VITAMIN) tablet Take by mouth.  Marland Kitchen omeprazole (PRILOSEC) 40 MG  capsule once daily TAKE 1 CAPSULE TWICE A DAY  . OneTouch Delica Lancets 33G MISC by Does not apply route.  Letta Pate VERIO test strip   . PROAIR HFA 108 (90 Base) MCG/ACT inhaler   . [DISCONTINUED] escitalopram (LEXAPRO) 10 MG tablet Take by mouth.  . [DISCONTINUED] Canagliflozin-metFORMIN HCl ER (INVOKAMET XR) [phone] MG TB24 TAKE 2 TABLETS ONCE DAILY  . [DISCONTINUED] Dulaglutide 1.5 MG/0.5ML SOPN Inject into the skin.  . [DISCONTINUED] hydrochlorothiazide (HYDRODIURIL) 25 MG tablet Take 25 mg by mouth daily.  .  [DISCONTINUED] ibuprofen (ADVIL) 600 MG tablet Take 600 mg by mouth every 6 (six) hours as needed.  . [DISCONTINUED] meloxicam (MOBIC) 7.5 MG tablet TAKE 1 TABLET BY MOUTH EVERY DAY  . [DISCONTINUED] Multiple Vitamins-Minerals (MULTIVITAMIN ADULTS PO) Take by mouth.  . [DISCONTINUED] oseltamivir (TAMIFLU) 75 MG capsule TAKE ONE CAPSULE (75 MG DOSE) BY MOUTH DAILY FOR 10 DAYS.  . [DISCONTINUED] OZEMPIC, 1 MG/DOSE, 2 MG/1.5ML SOPN Inject 1 mg into the skin once a week.  . [DISCONTINUED] pantoprazole (PROTONIX) 40 MG tablet Take 1 tablet (40 mg total) by mouth daily.  . [DISCONTINUED] rosuvastatin (CRESTOR) 40 MG tablet Take 40 mg by mouth daily.   No facility-administered encounter medications on file as of 02/22/2020.  Left wrist X-ray- will call you with result.  Consider wrist splint at night. Check at drugstore, actually we had one here to give to him today. He can wear that at night to keep from flexing his wrist when he sleeps.   Talk to Dr. Renard Matter about your wrist as well.   I will place a referral to PULMONARY for sleep apnea testing.   Follow-up: Return in about 3 months (around 05/24/2020).  This visit occurred during the SARS-CoV-2 public health emergency.  Safety protocols were in place, including screening questions prior to the visit, additional usage of staff PPE, and extensive cleaning of exam room while observing appropriate contact time as indicated for disinfecting solutions.   Amedeo Kinsman, NP

## 2020-02-22 NOTE — Telephone Encounter (Signed)
Please refill.

## 2020-02-22 NOTE — Patient Instructions (Addendum)
It was nice to meet you today.   Left wrist X-ray- will call you with result.  Consider wrist splint at night. Check at drugstore.   Talk to Dr. Meda Coffee about your wrist as well.   I will place a referral to PULMONARY for sleep apnea testing.   Wrist Pain, Adult There are many things that can cause wrist pain. Some common causes include:  An injury to the wrist area, such as a sprain, strain, or fracture.  Overuse of the joint.  A condition that causes increased pressure on a nerve in the wrist (carpal tunnel syndrome).  Wear and tear of the joints that occurs with aging (osteoarthritis).  A variety of other types of arthritis. Sometimes, the cause of wrist pain is not known. Often, the pain goes away when you follow instructions from your health care provider for relieving pain at home, such as resting or icing the wrist. If your wrist pain continues, it is important to tell your health care provider. Follow these instructions at home:  Rest the wrist area for at least 48 hours or as long as told by your health care provider.  If a splint or elastic bandage has been applied, use it as told by your health care provider. ? Remove the splint or bandage only as told by your health care provider. ? Loosen the splint or bandage if your fingers tingle, become numb, or turn cold or blue.  If directed, apply ice to the injured area. ? If you have a removable splint or elastic bandage, remove it as told by your health care provider. ? Put ice in a plastic bag. ? Place a towel between your skin and the bag or between your splint or bandage and the bag. ? Leave the ice on for 20 minutes, 2-3 times a day.   Keep your arm raised (elevated) above the level of your heart while you are sitting or lying down.  Take over-the-counter and prescription medicines only as told by your health care provider.  Keep all follow-up visits as told by your health care provider. This is important. Contact a  health care provider if:  You have a sudden sharp pain in the wrist, hand, or arm that is different or new.  The swelling or bruising on your wrist or hand gets worse.  Your skin becomes red, gets a rash, or has open sores.  Your pain does not get better or it gets worse. Get help right away if:  You lose feeling in your fingers or hand.  Your fingers turn white, very red, or cold and blue.  You cannot move your fingers.  You have a fever or chills. This information is not intended to replace advice given to you by your health care provider. Make sure you discuss any questions you have with your health care provider. Document Revised: 09/03/2017 Document Reviewed: 04/09/2016 Elsevier Patient Education  [place].  Wrist Pain, Adult There are many things that can cause wrist pain. Some common causes include:  An injury to the wrist area, such as a sprain, strain, or fracture.  Overuse of the joint.  A condition that causes increased pressure on a nerve in the wrist (carpal tunnel syndrome).  Wear and tear of the joints that occurs with aging (osteoarthritis).  A variety of other types of arthritis. Sometimes, the cause of wrist pain is not known. Often, the pain goes away when you follow instructions from your health care provider for relieving pain  at home, such as resting or icing the wrist. If your wrist pain continues, it is important to tell your health care provider. Follow these instructions at home:  Rest the wrist area for at least 48 hours or as long as told by your health care provider.  If a splint or elastic bandage has been applied, use it as told by your health care provider. ? Remove the splint or bandage only as told by your health care provider. ? Loosen the splint or bandage if your fingers tingle, become numb, or turn cold or blue.  If directed, apply ice to the injured area. ? If you have a removable splint or elastic bandage, remove it as told  by your health care provider. ? Put ice in a plastic bag. ? Place a towel between your skin and the bag or between your splint or bandage and the bag. ? Leave the ice on for 20 minutes, 2-3 times a day.   Keep your arm raised (elevated) above the level of your heart while you are sitting or lying down.  Take over-the-counter and prescription medicines only as told by your health care provider.  Keep all follow-up visits as told by your health care provider. This is important. Contact a health care provider if:  You have a sudden sharp pain in the wrist, hand, or arm that is different or new.  The swelling or bruising on your wrist or hand gets worse.  Your skin becomes red, gets a rash, or has open sores.  Your pain does not get better or it gets worse. Get help right away if:  You lose feeling in your fingers or hand.  Your fingers turn white, very red, or cold and blue.  You cannot move your fingers.  You have a fever or chills. This information is not intended to replace advice given to you by your health care provider. Make sure you discuss any questions you have with your health care provider. Document Revised: 09/03/2017 Document Reviewed: 04/09/2016 Elsevier Patient Education  2020 [employer].

## 2020-02-22 NOTE — Telephone Encounter (Signed)
Refill this med? 

## 2020-02-27 ENCOUNTER — Telehealth: Payer: Self-pay | Admitting: Podiatry

## 2020-02-27 NOTE — Telephone Encounter (Signed)
The patient completed ROI  For  Dr. Valentina Lucks to be faxed [place] and for [facility], [address] [place], [place] [address]. Request was processed and faxed and mailed

## 2020-02-28 ENCOUNTER — Telehealth: Payer: Self-pay | Admitting: *Deleted

## 2020-02-28 MED ORDER — MELOXICAM 7.5 MG PO TABS: 7.5000 mg | ORAL_TABLET | Freq: Every day | ORAL | 0 refills | Status: DC

## 2020-03-01 NOTE — Telephone Encounter (Signed)
Mobiic?

## 2020-03-13 ENCOUNTER — Telehealth: Payer: Self-pay | Admitting: Nurse Practitioner

## 2020-03-13 DIAGNOSIS — E119 Type 2 diabetes mellitus without complications: Secondary | ICD-10-CM

## 2020-03-13 DIAGNOSIS — E875 Hyperkalemia: Secondary | ICD-10-CM

## 2020-03-13 NOTE — Telephone Encounter (Signed)
LMTCB

## 2020-03-13 NOTE — Telephone Encounter (Signed)
Please call for an update on his wrist is doing. Is he wearing the splint at night?   Also, I would like to recheck his Bmet. If he would come into the lab for fasting blood work next week when it is convenient. Thank you.

## 2020-03-18 NOTE — Telephone Encounter (Signed)
Patient is better then when he came in to see [place]; still wearing the brace at night. Still hurts from time to time. Patient is scheduled for 03/21/20 at 8:30am for lab

## 2020-03-21 ENCOUNTER — Other Ambulatory Visit: Payer: Self-pay

## 2020-03-21 ENCOUNTER — Other Ambulatory Visit (INDEPENDENT_AMBULATORY_CARE_PROVIDER_SITE_OTHER): Payer: 59

## 2020-03-21 DIAGNOSIS — E875 Hyperkalemia: Secondary | ICD-10-CM | POA: Diagnosis not present

## 2020-03-21 DIAGNOSIS — E119 Type 2 diabetes mellitus without complications: Secondary | ICD-10-CM

## 2020-03-21 LAB — BASIC METABOLIC PANEL
BUN: 11 mg/dL (ref 6–23)
CO2: 29 mEq/L (ref 19–32)
Calcium: 9.5 mg/dL (ref 8.4–10.5)
Chloride: 103 mEq/L (ref 96–112)
Creatinine, Ser: 0.94 mg/dL (ref 0.40–1.50)
GFR: 101.29 mL/min (ref >60.00)
Glucose, Bld: 190 mg/dL — ABNORMAL HIGH (ref 70–99)
Potassium: 4.8 mEq/L (ref 3.5–5.1)
Sodium: 142 mEq/L (ref 135–145)

## 2020-03-25 ENCOUNTER — Other Ambulatory Visit: Payer: Self-pay

## 2020-05-22 ENCOUNTER — Other Ambulatory Visit: Payer: Self-pay

## 2020-05-24 ENCOUNTER — Encounter: Payer: Self-pay | Admitting: Nurse Practitioner

## 2020-05-24 ENCOUNTER — Other Ambulatory Visit: Payer: Self-pay

## 2020-05-24 ENCOUNTER — Ambulatory Visit: Payer: 59 | Admitting: Nurse Practitioner

## 2020-05-24 VITALS — BP 140/78 | HR 94 | Temp 98.6°F | Ht 70.0 in | Wt 241.0 lb

## 2020-05-24 DIAGNOSIS — G4733 Obstructive sleep apnea (adult) (pediatric): Secondary | ICD-10-CM | POA: Diagnosis not present

## 2020-05-24 DIAGNOSIS — I10 Essential (primary) hypertension: Secondary | ICD-10-CM

## 2020-05-24 DIAGNOSIS — Z23 Encounter for immunization: Secondary | ICD-10-CM | POA: Insufficient documentation

## 2020-05-24 DIAGNOSIS — E785 Hyperlipidemia, unspecified: Secondary | ICD-10-CM | POA: Diagnosis not present

## 2020-05-24 DIAGNOSIS — K625 Hemorrhage of anus and rectum: Secondary | ICD-10-CM

## 2020-05-24 DIAGNOSIS — Z125 Encounter for screening for malignant neoplasm of prostate: Secondary | ICD-10-CM

## 2020-05-24 DIAGNOSIS — E1159 Type 2 diabetes mellitus with other circulatory complications: Secondary | ICD-10-CM | POA: Diagnosis not present

## 2020-05-24 DIAGNOSIS — I152 Hypertension secondary to endocrine disorders: Secondary | ICD-10-CM

## 2020-05-24 DIAGNOSIS — E119 Type 2 diabetes mellitus without complications: Secondary | ICD-10-CM | POA: Diagnosis not present

## 2020-05-24 DIAGNOSIS — E875 Hyperkalemia: Secondary | ICD-10-CM

## 2020-05-24 DIAGNOSIS — M1000 Idiopathic gout, unspecified site: Secondary | ICD-10-CM

## 2020-05-24 DIAGNOSIS — L309 Dermatitis, unspecified: Secondary | ICD-10-CM

## 2020-05-24 HISTORY — DX: Hemorrhage of anus and rectum: K62.5

## 2020-05-24 LAB — COMPREHENSIVE METABOLIC PANEL
ALT: 36 U/L (ref 0–53)
AST: 21 U/L (ref 0–37)
Albumin: 4.8 g/dL (ref 3.5–5.2)
Alkaline Phosphatase: 87 U/L (ref 39–117)
BUN: 13 mg/dL (ref 6–23)
CO2: 30 mEq/L (ref 19–32)
Calcium: 10.5 mg/dL (ref 8.4–10.5)
Chloride: 103 mEq/L (ref 96–112)
Creatinine, Ser: 1.16 mg/dL (ref 0.40–1.50)
GFR: 79.41 mL/min (ref >60.00)
Glucose, Bld: 181 mg/dL — ABNORMAL HIGH (ref 70–99)
Potassium: 5.4 mEq/L — ABNORMAL HIGH (ref 3.5–5.1)
Sodium: 143 mEq/L (ref 135–145)
Total Bilirubin: 0.3 mg/dL (ref 0.2–1.2)
Total Protein: 7.3 g/dL (ref 6.0–8.3)

## 2020-05-24 LAB — CK: Total CK: 150 U/L (ref 7–232)

## 2020-05-24 LAB — B12 AND FOLATE PANEL
Folate: 18.9 ng/mL (ref >5.9)
Vitamin B-12: 853 pg/mL (ref 211–911)

## 2020-05-24 LAB — VITAMIN D 25 HYDROXY (VIT D DEFICIENCY, FRACTURES): VITD: 23.17 ng/mL — ABNORMAL LOW (ref 30.00–100.00)

## 2020-05-24 LAB — PSA: PSA: 0.43 ng/mL (ref 0.10–4.00)

## 2020-05-24 MED ORDER — ATORVASTATIN CALCIUM 40 MG PO TABS: 40.0000 mg | ORAL_TABLET | Freq: Every day | ORAL | 1 refills | Status: DC

## 2020-05-24 MED ORDER — LISINOPRIL 40 MG PO TABS: 40.0000 mg | ORAL_TABLET | Freq: Every day | ORAL | 1 refills | Status: DC

## 2020-05-24 NOTE — Patient Instructions (Addendum)
Please call your GI provider about the blood you saw in your stool a few weeks ago.   Please work on the diabetes.   Labs today.   OK colchicine and cholesterol medication. I will check for your muscle aches.  I placed a referral in for sleep apnea study.    Sleep Apnea Sleep apnea is a condition in which breathing pauses or becomes shallow during sleep. Episodes of sleep apnea usually last 10 seconds or longer, and they may occur as many as 20 times an hour. Sleep apnea disrupts your sleep and keeps your body from getting the rest that it needs. This condition can increase your risk of certain health problems, including:  Heart attack.  Stroke.  Obesity.  Diabetes.  Heart failure.  Irregular heartbeat. What are the causes? There are three kinds of sleep apnea:  Obstructive sleep apnea. This kind is caused by a blocked or collapsed airway.  Central sleep apnea. This kind happens when the part of the brain that controls breathing does not send the correct signals to the muscles that control breathing.  Mixed sleep apnea. This is a combination of obstructive and central sleep apnea. The most common cause of this condition is a collapsed or blocked airway. An airway can collapse or become blocked if:  Your throat muscles are abnormally relaxed.  Your tongue and tonsils are larger than normal.  You are overweight.  Your airway is smaller than normal. What increases the risk? You are more likely to develop this condition if you:  Are overweight.  Smoke.  Have a smaller than normal airway.  Are elderly.  Are male.  Drink alcohol.  Take sedatives or tranquilizers.  Have a family history of sleep apnea. What are the signs or symptoms? Symptoms of this condition include:  Trouble staying asleep.  Daytime sleepiness and tiredness.  Irritability.  Loud snoring.  Morning headaches.  Trouble concentrating.  Forgetfulness.  Decreased interest in  sex.  Unexplained sleepiness.  Mood swings.  Personality changes.  Feelings of depression.  Waking up often during the night to urinate.  Dry mouth.  Sore throat. How is this diagnosed? This condition may be diagnosed with:  A medical history.  A physical exam.  A series of tests that are done while you are sleeping (sleep study). These tests are usually done in a sleep lab, but they may also be done at home. How is this treated? Treatment for this condition aims to restore normal breathing and to ease symptoms during sleep. It may involve managing health issues that can affect breathing, such as high blood pressure or obesity. Treatment may include:  Sleeping on your side.  Using a decongestant if you have nasal congestion.  Avoiding the use of depressants, including alcohol, sedatives, and narcotics.  Losing weight if you are overweight.  Making changes to your diet.  Quitting smoking.  Using a device to open your airway while you sleep, such as: ? An oral appliance. This is a custom-made mouthpiece that shifts your lower jaw forward. ? A continuous positive airway pressure (CPAP) device. This device blows air through a mask when you breathe out (exhale). ? A nasal expiratory positive airway pressure (EPAP) device. This device has valves that you put into each nostril. ? A bi-level positive airway pressure (BPAP) device. This device blows air through a mask when you breathe in (inhale) and breathe out (exhale).  Having surgery if other treatments do not work. During surgery, excess tissue is removed to  create a wider airway. It is important to get treatment for sleep apnea. Without treatment, this condition can lead to:  High blood pressure.  Coronary artery disease.  In men, an inability to achieve or maintain an erection (impotence).  Reduced thinking abilities. Follow these instructions at home: Lifestyle  Make any lifestyle changes that your health care  provider recommends.  Eat a healthy, well-balanced diet.  Take steps to lose weight if you are overweight.  Avoid using depressants, including alcohol, sedatives, and narcotics.  Do not use any products that contain nicotine or tobacco, such as cigarettes, e-cigarettes, and chewing tobacco. If you need help quitting, ask your health care provider. General instructions  Take over-the-counter and prescription medicines only as told by your health care provider.  If you were given a device to open your airway while you sleep, use it only as told by your health care provider.  If you are having surgery, make sure to tell your health care provider you have sleep apnea. You may need to bring your device with you.  Keep all follow-up visits as told by your health care provider. This is important. Contact a health care provider if:  The device that you received to open your airway during sleep is uncomfortable or does not seem to be working.  Your symptoms do not improve.  Your symptoms get worse. Get help right away if:  You develop: ? Chest pain. ? Shortness of breath. ? Discomfort in your back, arms, or stomach.  You have: ? Trouble speaking. ? Weakness on one side of your body. ? Drooping in your face. These symptoms may represent a serious problem that is an emergency. Do not wait to see if the symptoms will go away. Get medical help right away. Call your local emergency services (911 in the [place] Do not drive yourself to the hospital. Summary  Sleep apnea is a condition in which breathing pauses or becomes shallow during sleep.  The most common cause is a collapsed or blocked airway.  The goal of treatment is to restore normal breathing and to ease symptoms during sleep. This information is not intended to replace advice given to you by your health care provider. Make sure you discuss any questions you have with your health care provider. Document Revised: 03/08/2019  Document Reviewed: 05/17/2018 Elsevier Patient Education  2020 [employer].

## 2020-05-24 NOTE — Progress Notes (Signed)
Established Patient Office Visit  Subjective:  Patient ID: Kevin Huerta, male    DOB: 1966/03/09  Age: 54 y.o. MRN: [id number]  CC:  Chief Complaint  Patient presents with  . Follow-up    HPI Kevin Huerta presents for 3 month f/up  HTN: maintained  on lisinopril 40 mg daily. No CP/SOB/DOE.   BP Readings from Last 3 Encounters:  05/24/20 140/78  02/22/20 121/76  12/19/18 121/74   T2DM: A1c 8.0. Neg micro albumin.  Followed by Endo- problems with Ozempic and he was educated on the medication management when he misses days.Bike riding 3 days per week.    HLD: On atorvastatin 40 mg daily. Lipids 01/18/2020: LDL 75 at goal   No results found for: CHOL, HDL, LDLCALC, LDLDIRECT, TRIG, CHOLHDL Non smoker for 2 months  Gout: Dr. Renard Matter follows uric acid level, not had an attack for 2 years. Not taking  Allopurinol as causes muscle aches and worse lower back ache.   Anxiety: Alprazolam  0.5 mg  x 1 per week. New start Wellbutrin XL 150 mg onset 2 weeks ago and is working better. Less Xanax use. Dr. Shelly Coss- Psychiatrist in [place]. Lexapro caused ED. Was placed on trazodone for sleep. May need sleep study.  History of BIS- 2 weeks ago-first day he passed a lot of blood and it lasted 4 days but decreased each day. No further problems.He is followed by GI -Dr. Noe Gens for  refills PPI BID GERD  Past Medical History:  Diagnosis Date  . Asthma   . Diabetes mellitus without complication (HCC)   . Heart murmur   . Hyperlipidemia   . Hypertension     Past Surgical History:  Procedure Laterality Date  . BACK SURGERY     cervical  . KNEE SURGERY Right   . ROTATOR CUFF REPAIR    . WISDOM TOOTH EXTRACTION      Family History  Problem Relation Age of Onset  . Heart attack Father   . Alcohol abuse Father   . Drug abuse Father   . Early death Father   . Heart disease Father   . Hypertension Mother     Social History   Socioeconomic History  . Marital status:  Married    Spouse name: Not on file  . Number of children: Not on file  . Years of education: Not on file  . Highest education level: Not on file  Occupational History  . Occupation: IT   Tobacco Use  . Smoking status: Former Smoker    Packs/day: 0.50    Years: 20.00    Pack years: 10.00    Types: Cigarettes    Quit date: 06/11/2018    Years since quitting: 1.9  . Smokeless tobacco: Former Neurosurgeon    Types: Snuff    Quit date: Equities trader  . Vaping Use: Some days  . Devices: x3 per day to quit smoking nicotine   Substance and Sexual Activity  . Alcohol use: Yes    Comment: beer up to 6 week and none recently  . Drug use: No  . Sexual activity: Yes    Partners: Female  Other Topics Concern  . Not on file  Social History Narrative   Married and lives with daughter    Social Determinants of Health   Financial Resource Strain:   . Difficulty of Paying Living Expenses: Not on file  Food Insecurity:   . Worried About Programme researcher, broadcasting/film/video in the Last  Year: Not on file  . Ran Out of Food in the Last Year: Not on file  Transportation Needs:   . Lack of Transportation (Medical): Not on file  . Lack of Transportation (Non-Medical): Not on file  Physical Activity:   . Days of Exercise per Week: Not on file  . Minutes of Exercise per Session: Not on file  Stress:   . Feeling of Stress : Not on file  Social Connections:   . Frequency of Communication with Friends and Family: Not on file  . Frequency of Social Gatherings with Friends and Family: Not on file  . Attends Religious Services: Not on file  . Active Member of Clubs or Organizations: Not on file  . Attends Banker Meetings: Not on file  . Marital Status: Not on file  Intimate Partner Violence:   . Fear of Current or Ex-Partner: Not on file  . Emotionally Abused: Not on file  . Physically Abused: Not on file  . Sexually Abused: Not on file    Outpatient Medications Prior to Visit  Medication Sig  Dispense Refill  . allopurinol (ZYLOPRIM) 100 MG tablet 2 tabs daily, 90 days    . ALPRAZolam (XANAX) 0.5 MG tablet Take by mouth.    Marland Kitchen aspirin 81 MG tablet Take 81 mg by mouth daily.    . Blood Glucose Monitoring Suppl (ONETOUCH VERIO FLEX SYSTEM) w/Device KIT by Does not apply route.    Marland Kitchen buPROPion (WELLBUTRIN XL) 150 MG 24 hr tablet Take by mouth.    . Canagliflozin-metFORMIN HCl ER (INVOKAMET XR) [phone] MG TB24 Take by mouth.    . cetirizine (ZYRTEC) 10 MG tablet Take 10 mg by mouth daily.    . colchicine 0.6 MG tablet Take 2 tablets (1.2mg ) by mouth at first sign of gout flare followed by 1 tablet (0.6mg ) after 1 hour. (Max 1.8mg  within 1 hour)    . glipiZIDE (GLUCOTROL XL) 5 MG 24 hr tablet Take by mouth.    Marland Kitchen ketoconazole (NIZORAL) 2 % cream APPLY TOPICALLY TWICE A DAY AS NEEDED    . meloxicam (MOBIC) 7.5 MG tablet Take 1 tablet (7.5 mg total) by mouth daily. 30 tablet 0  . Multiple Vitamin (MULTI-VITAMIN) tablet Take by mouth.    Marland Kitchen omeprazole (PRILOSEC) 40 MG capsule once daily TAKE 1 CAPSULE TWICE A DAY    . OneTouch Delica Lancets 33G MISC by Does not apply route.    Letta Pate VERIO test strip     . OZEMPIC, 1 MG/DOSE, 4 MG/3ML SOPN Inject 3 mLs into the skin once a week.    Marland Kitchen PROAIR HFA 108 (90 Base) MCG/ACT inhaler     . traZODone (DESYREL) 50 MG tablet Take by mouth.    Marland Kitchen atorvastatin (LIPITOR) 40 MG tablet TAKE 1 TABLET DAILY    . lisinopril (ZESTRIL) 40 MG tablet Take by mouth.     No facility-administered medications prior to visit.    No Known Allergies  Review of Systems Positives noted in HPI and otherwise negative    Objective:    Physical Exam Vitals reviewed.  Constitutional:      Appearance: Normal appearance.  Eyes:     Conjunctiva/sclera: Conjunctivae normal.     Pupils: Pupils are equal, round, and reactive to light.  Cardiovascular:     Rate and Rhythm: Normal rate and regular rhythm.     Pulses: Normal pulses.     Heart sounds: Normal heart  sounds.  Pulmonary:  Effort: Pulmonary effort is normal.     Breath sounds: Normal breath sounds.  Abdominal:     Palpations: Abdomen is soft.     Tenderness: There is no abdominal tenderness.  Musculoskeletal:        General: Normal range of motion.     Cervical back: Neck supple.  Skin:    General: Skin is warm and dry.  Neurological:     General: No focal deficit present.     Mental Status: He is alert and oriented to person, place, and time.  Psychiatric:        Mood and Affect: Mood normal.        Behavior: Behavior normal.     BP 140/78 (BP Location: Left Arm, Patient Position: Sitting, Cuff Size: Normal)   Pulse 94   Temp 98.6 F (37 C) (Oral)   Ht 5\' 10"  (1.778 m)   Wt 241 lb (109.3 kg)   SpO2 97%   BMI 34.58 kg/m  Wt Readings from Last 3 Encounters:  05/24/20 241 lb (109.3 kg)  02/22/20 246 lb (111.6 kg)  06/17/18 240 lb (108.9 kg)   Pulse Readings from Last 3 Encounters:  05/24/20 94  02/22/20 82  12/19/18 82    BP Readings from Last 3 Encounters:  05/24/20 140/78  02/22/20 121/76  12/19/18 121/74    No results found for: CHOL, HDL, LDLCALC, LDLDIRECT, TRIG, CHOLHDL    Health Maintenance Due  Topic Date Due  . HEMOGLOBIN A1C  Never done  . Hepatitis C Screening  Never done  . HIV Screening  Never done    There are no preventive care reminders to display for this patient.  No results found for: TSH Lab Results  Component Value Date   WBC 7.2 06/17/2018   HGB 14.9 06/17/2018   HCT 45.4 06/17/2018   MCV 86.0 06/17/2018   PLT 251 06/17/2018   Lab Results  Component Value Date   NA 142 03/21/2020   K 4.8 03/21/2020   CO2 29 03/21/2020   GLUCOSE 190 (H) 03/21/2020   BUN 11 03/21/2020   CREATININE 0.94 03/21/2020   BILITOT 0.6 06/17/2018   ALKPHOS 59 06/17/2018   AST 31 06/17/2018   ALT 38 06/17/2018   PROT 7.1 06/17/2018   ALBUMIN 4.2 06/17/2018   CALCIUM 9.5 03/21/2020   ANIONGAP 12 06/17/2018   GFR 101.29 03/21/2020   No  results found for: CHOL No results found for: HDL No results found for: LDLCALC No results found for: TRIG No results found for: CHOLHDL No results found for: OZDG6Y    Assessment & Plan:   Problem List Items Addressed This Visit      Cardiovascular and Mediastinum   Hypertension associated with diabetes (HCC)   Relevant Medications   atorvastatin (LIPITOR) 40 MG tablet   lisinopril (ZESTRIL) 40 MG tablet   Other Relevant Orders   Comp Met (CMET)     Respiratory   OSA (obstructive sleep apnea) - Primary   Relevant Orders   Ambulatory referral to Pulmonology     Digestive   BRBPR (bright red blood per rectum)     Endocrine   Type 2 diabetes mellitus without complication, without long-term current use of insulin (HCC)   Relevant Medications   atorvastatin (LIPITOR) 40 MG tablet   lisinopril (ZESTRIL) 40 MG tablet   Other Relevant Orders   Vitamin D (25 hydroxy)   B12 and Folate Panel     Musculoskeletal and Integument   Eczema  Other   Gout   Hyperlipidemia LDL goal <100   Relevant Medications   atorvastatin (LIPITOR) 40 MG tablet   lisinopril (ZESTRIL) 40 MG tablet   Other Relevant Orders   CK   Prostate cancer screening   Relevant Orders   PSA    Other Visit Diagnoses    Need for immunization against influenza       Relevant Orders   Flu Vaccine QUAD 36+ mos IM (Completed)      Meds ordered this encounter  Medications  . atorvastatin (LIPITOR) 40 MG tablet    Sig: Take 1 tablet (40 mg total) by mouth daily.    Dispense:  90 tablet    Refill:  1    Order Specific Question:   Supervising Provider    Answer:   Dale Haubstadt [id number]  . lisinopril (ZESTRIL) 40 MG tablet    Sig: Take 1 tablet (40 mg total) by mouth daily.    Dispense:  90 tablet    Refill:  1    Order Specific Question:   Supervising Provider    Answer:   Dale [place] [[id number]]   Please call your GI provider about the blood you saw in your stool a few weeks ago.   Please  work on the diabetes.   Labs today.   OK to take prn colchicine and cholesterol medication. I will check for your muscle aches.  I placed a referral in for sleep apnea study.   Follow-up: Return in about 3 months (around 08/24/2020).   This visit occurred during the SARS-CoV-2 public health emergency.  Safety protocols were in place, including screening questions prior to the visit, additional usage of staff PPE, and extensive cleaning of exam room while observing appropriate contact time as indicated for disinfecting solutions.   Amedeo Kinsman, NP

## 2020-05-28 ENCOUNTER — Telehealth: Payer: Self-pay

## 2020-05-28 NOTE — Telephone Encounter (Signed)
error 

## 2020-06-24 ENCOUNTER — Telehealth: Payer: Self-pay | Admitting: Nurse Practitioner

## 2020-06-24 NOTE — Telephone Encounter (Signed)
Left patient a VM about below and left pulm number for him in case he does not have it anymore.

## 2020-06-24 NOTE — Telephone Encounter (Signed)
Pt was called dated on 05/28/2020:  LVM for pt to call back and schedule sleep consult.//TM  Pt would need to check vm to call Pulmonology

## 2020-06-24 NOTE — Telephone Encounter (Signed)
Patient called in wanted to know about his referral for sleep study

## 2020-06-24 NOTE — Telephone Encounter (Signed)
Patient has not heard back about his referral to pulmonology; can you resend the referral to them?

## 2020-06-28 ENCOUNTER — Other Ambulatory Visit: Payer: 59

## 2020-07-16 ENCOUNTER — Encounter: Payer: Self-pay | Admitting: Pulmonary Disease

## 2020-07-16 ENCOUNTER — Ambulatory Visit: Payer: 59 | Admitting: Pulmonary Disease

## 2020-07-16 ENCOUNTER — Other Ambulatory Visit: Payer: Self-pay

## 2020-07-16 VITALS — BP 122/68 | HR 96 | Ht 70.0 in | Wt 233.6 lb

## 2020-07-16 DIAGNOSIS — G4733 Obstructive sleep apnea (adult) (pediatric): Secondary | ICD-10-CM

## 2020-07-16 NOTE — Progress Notes (Signed)
Kevin Huerta    [id number]    Apr 25, 1966  Primary Care Physician:Mills, Ranae Plumber, NP  Referring Physician: Theadore Nan, NP [address] [place],  [place] [address]  Chief complaint:   Patient being seen for sleep problems  HPI:  Past history of mild obstructive sleep apnea about 20 years ago His weight is about 40 pounds below what it was back then Was having problems sleeping Started on trazodone which is helping his hours of sleep  He used to snore but no longer snoring significantly since he quit smoking  Usually goes to bed between 10 PM and 11 PM Takes him about 10 minutes to fall asleep with medications 4-5 awakenings, this is better since starting trazodone  Final awakening time about 7:15 in the morning  Weight continues to improve  Denies any significant dryness of his mouth in the mornings Denies morning headaches Occasional sweats  No family history of obstructive sleep apnea  Reformed smoker   Outpatient Encounter Medications as of 07/16/2020  Medication Sig  . allopurinol (ZYLOPRIM) 100 MG tablet 2 tabs daily, 90 days  . ALPRAZolam (XANAX) 0.5 MG tablet Take by mouth.  Marland Kitchen aspirin 81 MG tablet Take 81 mg by mouth daily.  Marland Kitchen atorvastatin (LIPITOR) 40 MG tablet Take 1 tablet (40 mg total) by mouth daily.  . Blood Glucose Monitoring Suppl (ONETOUCH VERIO FLEX SYSTEM) w/Device KIT by Does not apply route.  Marland Kitchen buPROPion (WELLBUTRIN XL) 150 MG 24 hr tablet Take by mouth.  . Canagliflozin-metFORMIN HCl ER (INVOKAMET XR) [phone] MG TB24 Take by mouth.  . cetirizine (ZYRTEC) 10 MG tablet Take 10 mg by mouth daily.  . colchicine 0.6 MG tablet Take 2 tablets (1.2mg ) by mouth at first sign of gout flare followed by 1 tablet (0.6mg ) after 1 hour. (Max 1.8mg  within 1 hour)  . glipiZIDE (GLUCOTROL XL) 5 MG 24 hr tablet Take by mouth.  Marland Kitchen ketoconazole (NIZORAL) 2 % cream APPLY TOPICALLY TWICE A DAY AS NEEDED  . lisinopril (ZESTRIL) 40  MG tablet Take 1 tablet (40 mg total) by mouth daily.  . Melatonin 10 MG CAPS Take by mouth.  . meloxicam (MOBIC) 7.5 MG tablet Take 1 tablet (7.5 mg total) by mouth daily.  . Multiple Vitamin (MULTI-VITAMIN) tablet Take by mouth.  Marland Kitchen omeprazole (PRILOSEC) 40 MG capsule once daily TAKE 1 CAPSULE TWICE A DAY  . OneTouch Delica Lancets 33G MISC by Does not apply route.  Letta Pate VERIO test strip   . OZEMPIC, 1 MG/DOSE, 4 MG/3ML SOPN Inject 3 mLs into the skin once a week.  Marland Kitchen PROAIR HFA 108 (90 Base) MCG/ACT inhaler   . traZODone (DESYREL) 50 MG tablet Take by mouth.   No facility-administered encounter medications on file as of 07/16/2020.    Allergies as of 07/16/2020  . (No Known Allergies)    Past Medical History:  Diagnosis Date  . Asthma   . Diabetes mellitus without complication (HCC)   . Heart murmur   . Hyperlipidemia   . Hypertension     Past Surgical History:  Procedure Laterality Date  . BACK SURGERY     cervical  . KNEE SURGERY Right   . ROTATOR CUFF REPAIR    . WISDOM TOOTH EXTRACTION      Family History  Problem Relation Age of Onset  . Heart attack Father   . Alcohol abuse Father   . Drug abuse Father   . Early  death Father   . Heart disease Father   . Hypertension Mother     Social History   Socioeconomic History  . Marital status: Married    Spouse name: Not on file  . Number of children: Not on file  . Years of education: Not on file  . Highest education level: Not on file  Occupational History  . Occupation: IT   Tobacco Use  . Smoking status: Former Smoker    Packs/day: 0.50    Years: 20.00    Pack years: 10.00    Types: Cigarettes    Quit date: 06/11/2018    Years since quitting: 2.0  . Smokeless tobacco: Former Neurosurgeon    Types: Snuff    Quit date: Equities trader  . Vaping Use: Some days  . Devices: x3 per day to quit smoking nicotine   Substance and Sexual Activity  . Alcohol use: Yes    Comment: beer up to 6 week and none  recently  . Drug use: No  . Sexual activity: Yes    Partners: Female  Other Topics Concern  . Not on file  Social History Narrative   Married and lives with daughter    Social Determinants of Health   Financial Resource Strain:   . Difficulty of Paying Living Expenses: Not on file  Food Insecurity:   . Worried About Programme researcher, broadcasting/film/video in the Last Year: Not on file  . Ran Out of Food in the Last Year: Not on file  Transportation Needs:   . Lack of Transportation (Medical): Not on file  . Lack of Transportation (Non-Medical): Not on file  Physical Activity:   . Days of Exercise per Week: Not on file  . Minutes of Exercise per Session: Not on file  Stress:   . Feeling of Stress : Not on file  Social Connections:   . Frequency of Communication with Friends and Family: Not on file  . Frequency of Social Gatherings with Friends and Family: Not on file  . Attends Religious Services: Not on file  . Active Member of Clubs or Organizations: Not on file  . Attends Banker Meetings: Not on file  . Marital Status: Not on file  Intimate Partner Violence:   . Fear of Current or Ex-Partner: Not on file  . Emotionally Abused: Not on file  . Physically Abused: Not on file  . Sexually Abused: Not on file    Review of Systems  Respiratory: Positive for apnea.   Psychiatric/Behavioral: Positive for sleep disturbance.    Vitals:   07/16/20 1620  BP: 122/68  Pulse: 96  SpO2: 97%     Physical Exam Constitutional:      Appearance: He is obese.  HENT:     Head: Normocephalic and atraumatic.     Mouth/Throat:     Mouth: Mucous membranes are moist.     Comments: Mallampati 2, crowded oropharynx Eyes:     General:        Right eye: No discharge.        Left eye: No discharge.  Cardiovascular:     Rate and Rhythm: Normal rate and regular rhythm.     Heart sounds: No murmur heard.  No friction rub.  Pulmonary:     Effort: Pulmonary effort is normal. No respiratory  distress.     Breath sounds: No stridor. No wheezing or rhonchi.  Musculoskeletal:     Cervical back: No rigidity or tenderness.  Neurological:  Mental Status: He is alert.  Psychiatric:        Mood and Affect: Mood normal.    Results of the Epworth flowsheet 07/16/2020  Sitting and reading 2  Watching TV 2  Sitting, inactive in a public place (e.g. a theatre or a meeting) 2  As a passenger in a car for an hour without a break 0  Lying down to rest in the afternoon when circumstances permit 1  Sitting and talking to someone 1  Sitting quietly after a lunch without alcohol 1  In a car, while stopped for a few minutes in traffic 0  Total score 9   Assessment:  Excessive daytime sleepiness  Snoring  Past history of mild obstructive sleep apnea  Pathophysiology of sleep disordered breathing discussed with the patient Treatment options for sleep disordered breathing discussed with the patient  Plan/Recommendations: Schedule patient for home sleep study  Risk of nontreatment sleep disordered breathing discussed with the patient  I will see him back in about 3 months Encouraged to call with any significant concerns  Virl Diamond MD [facility] 07/16/2020, 4:27 PM  CC: Theadore Nan, NP

## 2020-07-16 NOTE — Patient Instructions (Signed)
Moderate probability of obstructive sleep apnea Past history of mild obstructive sleep apnea  We will schedule him for home sleep study Update your results contribute  Treatment options were discussed  Follow-up in 3 months  Call with significant concerns  Continue medications to help you get an adequate number of hours of sleep Sleep Apnea Sleep apnea affects breathing during sleep. It causes breathing to stop for a short time or to become shallow. It can also increase the risk of:  Heart attack.  Stroke.  Being very overweight (obese).  Diabetes.  Heart failure.  Irregular heartbeat. The goal of treatment is to help you breathe normally again. What are the causes? There are three kinds of sleep apnea:  Obstructive sleep apnea. This is caused by a blocked or collapsed airway.  Central sleep apnea. This happens when the brain does not send the right signals to the muscles that control breathing.  Mixed sleep apnea. This is a combination of obstructive and central sleep apnea. The most common cause of this condition is a collapsed or blocked airway. This can happen if:  Your throat muscles are too relaxed.  Your tongue and tonsils are too large.  You are overweight.  Your airway is too small. What increases the risk?  Being overweight.  Smoking.  Having a small airway.  Being older.  Being male.  Drinking alcohol.  Taking medicines to calm yourself (sedatives or tranquilizers).  Having family members with the condition. What are the signs or symptoms?  Trouble staying asleep.  Being sleepy or tired during the day.  Getting angry a lot.  Loud snoring.  Headaches in the morning.  Not being able to focus your mind (concentrate).  Forgetting things.  Less interest in sex.  Mood swings.  Personality changes.  Feelings of sadness (depression).  Waking up a lot during the night to pee (urinate).  Dry mouth.  Sore throat. How is this  diagnosed?  Your medical history.  A physical exam.  A test that is done when you are sleeping (sleep study). The test is most often done in a sleep lab but may also be done at home. How is this treated?   Sleeping on your side.  Using a medicine to get rid of mucus in your nose (decongestant).  Avoiding the use of alcohol, medicines to help you relax, or certain pain medicines (narcotics).  Losing weight, if needed.  Changing your diet.  Not smoking.  Using a machine to open your airway while you sleep, such as: ? An oral appliance. This is a mouthpiece that shifts your lower jaw forward. ? A CPAP device. This device blows air through a mask when you breathe out (exhale). ? An EPAP device. This has valves that you put in each nostril. ? A BPAP device. This device blows air through a mask when you breathe in (inhale) and breathe out.  Having surgery if other treatments do not work. It is important to get treatment for sleep apnea. Without treatment, it can lead to:  High blood pressure.  Coronary artery disease.  In men, not being able to have an erection (impotence).  Reduced thinking ability. Follow these instructions at home: Lifestyle  Make changes that your doctor recommends.  Eat a healthy diet.  Lose weight if needed.  Avoid alcohol, medicines to help you relax, and some pain medicines.  Do not use any products that contain nicotine or tobacco, such as cigarettes, e-cigarettes, and chewing tobacco. If you need help  quitting, ask your doctor. General instructions  Take over-the-counter and prescription medicines only as told by your doctor.  If you were given a machine to use while you sleep, use it only as told by your doctor.  If you are having surgery, make sure to tell your doctor you have sleep apnea. You may need to bring your device with you.  Keep all follow-up visits as told by your doctor. This is important. Contact a doctor if:  The  machine that you were given to use during sleep bothers you or does not seem to be working.  You do not get better.  You get worse. Get help right away if:  Your chest hurts.  You have trouble breathing in enough air.  You have an uncomfortable feeling in your back, arms, or stomach.  You have trouble talking.  One side of your body feels weak.  A part of your face is hanging down. These symptoms may be an emergency. Do not wait to see if the symptoms will go away. Get medical help right away. Call your local emergency services (911 in the [place] Do not drive yourself to the hospital. Summary  This condition affects breathing during sleep.  The most common cause is a collapsed or blocked airway.  The goal of treatment is to help you breathe normally while you sleep. This information is not intended to replace advice given to you by your health care provider. Make sure you discuss any questions you have with your health care provider. Document Revised: 07/08/2018 Document Reviewed: 05/17/2018 Elsevier Patient Education  2020 [employer].

## 2020-08-22 ENCOUNTER — Ambulatory Visit: Payer: 59

## 2020-08-22 ENCOUNTER — Other Ambulatory Visit: Payer: Self-pay

## 2020-08-22 DIAGNOSIS — G4733 Obstructive sleep apnea (adult) (pediatric): Secondary | ICD-10-CM

## 2020-08-27 ENCOUNTER — Other Ambulatory Visit: Payer: Self-pay

## 2020-08-27 DIAGNOSIS — G4733 Obstructive sleep apnea (adult) (pediatric): Secondary | ICD-10-CM | POA: Diagnosis not present

## 2020-08-28 ENCOUNTER — Telehealth: Payer: Self-pay | Admitting: Pulmonary Disease

## 2020-08-28 NOTE — Telephone Encounter (Signed)
Called and spoke with pt letting him know the results of HST and recommendations per AO. Pt stated he would like to schedule visit to further discuss results and tx options. televisit has been scheduled for pt with [facility] 11/29. Nothing further needed.

## 2020-08-28 NOTE — Telephone Encounter (Signed)
Call patient  Sleep study result  Date of study: 08/22/2020  Impression: Mild obstructive sleep apnea No significant oxygen desaturations  Recommendation: Options for treatment of mild sleep disordered breathing will include CPAP therapy or an oral device  If CPAP therapy is chosen as an option of treatment, auto titrating CPAP with pressure settings of 5-15 will be appropriate-DME referral  An oral device will entail referral to dentist to fashion a device  Follow-up in office 4 to 6 weeks following initiation of treatment

## 2020-09-02 ENCOUNTER — Encounter: Payer: Self-pay | Admitting: Primary Care

## 2020-09-02 ENCOUNTER — Ambulatory Visit (INDEPENDENT_AMBULATORY_CARE_PROVIDER_SITE_OTHER): Payer: 59 | Admitting: Primary Care

## 2020-09-02 ENCOUNTER — Other Ambulatory Visit: Payer: Self-pay

## 2020-09-02 DIAGNOSIS — G4733 Obstructive sleep apnea (adult) (pediatric): Secondary | ICD-10-CM

## 2020-09-02 NOTE — Progress Notes (Signed)
Virtual Visit via Telephone Note  I connected with Kevin Huerta on 09/02/20 at 11:00 AM EST by telephone and verified that I am speaking with the correct person using two identifiers.  Location: Patient: Home Provider: Office    I discussed the limitations, risks, security and privacy concerns of performing an evaluation and management service by telephone and the availability of in person appointments. I also discussed with the patient that there may be a patient responsible charge related to this service. The patient expressed understanding and agreed to proceed.   History of Present Illness: 54 year old male, former smoker quit in 2019 (10-pack-year history).  Past medical history significant for asthma, OSA, hypersomnia, allergic rhinitis, hypertension, type 2 diabetes.  Patient of Dr. Wynona Neat, seen for initial sleep consult on 07/16/20.   Previous [facility] pulmonary encounter: 07/16/20- Dr. Wynona Neat  Past history of mild obstructive sleep apnea about 20 years ago His weight is about 40 pounds below what it was back then Was having problems sleeping Started on trazodone which is helping his hours of sleep  He used to snore but no longer snoring significantly since he quit smoking  Usually goes to bed between 10 PM and 11 PM Takes him about 10 minutes to fall asleep with medications 4-5 awakenings, this is better since starting trazodone  Final awakening time about 7:15 in the morning  Weight continues to improve  Denies any significant dryness of his mouth in the mornings Denies morning headaches Occasional sweats  No family history of obstructive sleep apnea   09/02/2020- Interim hx  Patient contacted today to review sleep study results. HST showed mild OSA, AHI 7.9, SpO2 low 82% (baseline 94%). He has lost 40-60lbs since his original sleep study. He has worked hard to maintain a heathy lifestyle. He quit smoking in 2019. Over the last year he has had some trouble sleeping.  Wakes up 4-5 times at night, he is taking trazodone which has helped some. Takes him on average 10 mins to fall asleep. He is up to date with influenza vaccine and received covid 19 booster in Nov 2021.    Observations/Objective:  - Able to speak in full sentences; no overt shortness of breath, wheezing or cough   Assessment and Plan:  Mild OSA - HST 08/22/20 showed mild OSA, AHI 7.9, SpO2 low 82% (baseline 94%) - Treatment options include weight loss, side sleeping position, oral appliance, CPAP or referral to ENT for possible surgical options. Patient is interested in pursuing oral appliance. We will refer him to either Dr. Irene Limbo or Dr. Charlton Amor.  - Discussed risk of untreated sleep apnea including cardiovascular disease, cardiac arrhthymias, pulmonary hypertension, MI, diabetes, increased daytime/motor vehicle accidents   - Advised patient not to drive if experiencing excessive daytime fatigue or somnolence. Avoid taking sedating medication or drinking alcohol in excess prior to bedtime as these can worse underlying obstructive sleep apnea     Follow Up Instructions:   - FU in 6 months with Dr. Wynona Neat  I discussed the assessment and treatment plan with the patient. The patient was provided an opportunity to ask questions and all were answered. The patient agreed with the plan and demonstrated an understanding of the instructions.   The patient was advised to call back or seek an in-person evaluation if the symptoms worsen or if the condition fails to improve as anticipated.  I provided 22 minutes of non-face-to-face time during this encounter.   Glenford Bayley, NP

## 2020-09-02 NOTE — Patient Instructions (Addendum)
Home sleep study showed mild obstructive sleep apnea; AHI 7.9, SpO2 low 82% (baseline 94%)  Treatment options include weight loss, side sleeping position, oral appliance, CPAP or referral to ENT for possible surgical options  Discussed risk of untreated sleep apnea including cardiovascular disease, cardiac arrhthymias, pulmonary hypertension, MI, diabetes, increased daytime/motor vehicle accidents    Do not drive if you have any daytime fatigue or somnolence. Avoid taking sedating medication or drinking alcohol in excess prior to bedtime as these can worse underlying obstructive sleep apnea    Refer: Dr. Irene Limbo or Dr. Charlton Amor  Re: oral appliance evaluation for OSA   Follow-up: 6 months with Dr. Wynona Neat    Sleep Apnea Sleep apnea affects breathing during sleep. It causes breathing to stop for a short time or to become shallow. It can also increase the risk of:  Heart attack.  Stroke.  Being very overweight (obese).  Diabetes.  Heart failure.  Irregular heartbeat. The goal of treatment is to help you breathe normally again. What are the causes? There are three kinds of sleep apnea:  Obstructive sleep apnea. This is caused by a blocked or collapsed airway.  Central sleep apnea. This happens when the brain does not send the right signals to the muscles that control breathing.  Mixed sleep apnea. This is a combination of obstructive and central sleep apnea. The most common cause of this condition is a collapsed or blocked airway. This can happen if:  Your throat muscles are too relaxed.  Your tongue and tonsils are too large.  You are overweight.  Your airway is too small. What increases the risk?  Being overweight.  Smoking.  Having a small airway.  Being older.  Being male.  Drinking alcohol.  Taking medicines to calm yourself (sedatives or tranquilizers).  Having family members with the condition. What are the signs or symptoms?  Trouble  staying asleep.  Being sleepy or tired during the day.  Getting angry a lot.  Loud snoring.  Headaches in the morning.  Not being able to focus your mind (concentrate).  Forgetting things.  Less interest in sex.  Mood swings.  Personality changes.  Feelings of sadness (depression).  Waking up a lot during the night to pee (urinate).  Dry mouth.  Sore throat. How is this diagnosed?  Your medical history.  A physical exam.  A test that is done when you are sleeping (sleep study). The test is most often done in a sleep lab but may also be done at home. How is this treated?   Sleeping on your side.  Using a medicine to get rid of mucus in your nose (decongestant).  Avoiding the use of alcohol, medicines to help you relax, or certain pain medicines (narcotics).  Losing weight, if needed.  Changing your diet.  Not smoking.  Using a machine to open your airway while you sleep, such as: ? An oral appliance. This is a mouthpiece that shifts your lower jaw forward. ? A CPAP device. This device blows air through a mask when you breathe out (exhale). ? An EPAP device. This has valves that you put in each nostril. ? A BPAP device. This device blows air through a mask when you breathe in (inhale) and breathe out.  Having surgery if other treatments do not work. It is important to get treatment for sleep apnea. Without treatment, it can lead to:  High blood pressure.  Coronary artery disease.  In men, not being able to have an  erection (impotence).  Reduced thinking ability. Follow these instructions at home: Lifestyle  Make changes that your doctor recommends.  Eat a healthy diet.  Lose weight if needed.  Avoid alcohol, medicines to help you relax, and some pain medicines.  Do not use any products that contain nicotine or tobacco, such as cigarettes, e-cigarettes, and chewing tobacco. If you need help quitting, ask your doctor. General  instructions  Take over-the-counter and prescription medicines only as told by your doctor.  If you were given a machine to use while you sleep, use it only as told by your doctor.  If you are having surgery, make sure to tell your doctor you have sleep apnea. You may need to bring your device with you.  Keep all follow-up visits as told by your doctor. This is important. Contact a doctor if:  The machine that you were given to use during sleep bothers you or does not seem to be working.  You do not get better.  You get worse. Get help right away if:  Your chest hurts.  You have trouble breathing in enough air.  You have an uncomfortable feeling in your back, arms, or stomach.  You have trouble talking.  One side of your body feels weak.  A part of your face is hanging down. These symptoms may be an emergency. Do not wait to see if the symptoms will go away. Get medical help right away. Call your local emergency services (911 in the [place] Do not drive yourself to the hospital. Summary  This condition affects breathing during sleep.  The most common cause is a collapsed or blocked airway.  The goal of treatment is to help you breathe normally while you sleep. This information is not intended to replace advice given to you by your health care provider. Make sure you discuss any questions you have with your health care provider. Document Revised: 07/08/2018 Document Reviewed: 05/17/2018 Elsevier Patient Education  2020 [employer].

## 2020-09-03 ENCOUNTER — Encounter: Payer: Self-pay | Admitting: Nurse Practitioner

## 2020-09-03 ENCOUNTER — Ambulatory Visit: Payer: 59 | Admitting: Nurse Practitioner

## 2020-09-03 VITALS — BP 118/62 | HR 86 | Temp 97.9°F | Ht 70.0 in | Wt 232.0 lb

## 2020-09-03 DIAGNOSIS — E119 Type 2 diabetes mellitus without complications: Secondary | ICD-10-CM | POA: Diagnosis not present

## 2020-09-03 DIAGNOSIS — I152 Hypertension secondary to endocrine disorders: Secondary | ICD-10-CM

## 2020-09-03 DIAGNOSIS — R7989 Other specified abnormal findings of blood chemistry: Secondary | ICD-10-CM | POA: Diagnosis not present

## 2020-09-03 DIAGNOSIS — Z114 Encounter for screening for human immunodeficiency virus [HIV]: Secondary | ICD-10-CM

## 2020-09-03 DIAGNOSIS — E785 Hyperlipidemia, unspecified: Secondary | ICD-10-CM | POA: Diagnosis not present

## 2020-09-03 DIAGNOSIS — E1159 Type 2 diabetes mellitus with other circulatory complications: Secondary | ICD-10-CM | POA: Diagnosis not present

## 2020-09-03 DIAGNOSIS — Z1159 Encounter for screening for other viral diseases: Secondary | ICD-10-CM

## 2020-09-03 DIAGNOSIS — K760 Fatty (change of) liver, not elsewhere classified: Secondary | ICD-10-CM

## 2020-09-03 DIAGNOSIS — E538 Deficiency of other specified B group vitamins: Secondary | ICD-10-CM

## 2020-09-03 HISTORY — DX: Fatty (change of) liver, not elsewhere classified: K76.0

## 2020-09-03 LAB — VITAMIN D 25 HYDROXY (VIT D DEFICIENCY, FRACTURES): VITD: 34.82 ng/mL (ref 30.00–100.00)

## 2020-09-03 LAB — LIPID PANEL
Cholesterol: 173 mg/dL (ref 0–200)
HDL: 41.5 mg/dL (ref >39.00)
LDL Cholesterol: 106 mg/dL — ABNORMAL HIGH (ref 0–99)
NonHDL: 131.59
Total CHOL/HDL Ratio: 4
Triglycerides: 127 mg/dL (ref 0.0–149.0)
VLDL: 25.4 mg/dL (ref 0.0–40.0)

## 2020-09-03 LAB — COMPREHENSIVE METABOLIC PANEL
ALT: 42 U/L (ref 0–53)
AST: 24 U/L (ref 0–37)
Albumin: 4.8 g/dL (ref 3.5–5.2)
Alkaline Phosphatase: 75 U/L (ref 39–117)
BUN: 11 mg/dL (ref 6–23)
CO2: 31 mEq/L (ref 19–32)
Calcium: 10.2 mg/dL (ref 8.4–10.5)
Chloride: 103 mEq/L (ref 96–112)
Creatinine, Ser: 1.02 mg/dL (ref 0.40–1.50)
GFR: 83.49 mL/min (ref >60.00)
Glucose, Bld: 139 mg/dL — ABNORMAL HIGH (ref 70–99)
Potassium: 4.8 mEq/L (ref 3.5–5.1)
Sodium: 144 mEq/L (ref 135–145)
Total Bilirubin: 0.2 mg/dL (ref 0.2–1.2)
Total Protein: 7.3 g/dL (ref 6.0–8.3)

## 2020-09-03 LAB — B12 AND FOLATE PANEL
Folate: >23.6 ng/mL (ref >5.9)
Vitamin B-12: 782 pg/mL (ref 211–911)

## 2020-09-03 MED ORDER — ATORVASTATIN CALCIUM 40 MG PO TABS: 40.0000 mg | ORAL_TABLET | Freq: Every day | ORAL | 1 refills | Status: DC

## 2020-09-03 MED ORDER — LISINOPRIL 40 MG PO TABS: 40.0000 mg | ORAL_TABLET | Freq: Every day | ORAL | 1 refills | Status: DC

## 2020-09-03 NOTE — Addendum Note (Signed)
Addended by: Warden Fillers on: 09/03/2020 04:07 PM   Modules accepted: Orders

## 2020-09-03 NOTE — Patient Instructions (Signed)
Please go to the lab today.   I have placed a referral in to [facility]- chronic care management.   No medication changes today.   Consider switching your lisinopril to losartan in the future to avoid allergic reaction some providers recommend this- others do not.   Continue to follow up with your specialists.

## 2020-09-03 NOTE — Progress Notes (Signed)
Established Patient Office Visit  Subjective:  Patient ID: Kevin Huerta, male    DOB: 1965-10-25  Age: 54 y.o. MRN: [id number]  CC:  Chief Complaint  Patient presents with  . Follow-up    HPI Kevin Huerta is a 54 year old patient who presents for routine follow-up visit.  He reports he is doing well and has no specific complaints today.  He does needs refills on lisinopril and rosuvastatin.  HTN: lisinopril 40 mg and has noted no angioedema or skin rash problems.  He does not check his blood pressures at home.  In the office, he seems to be well controlled with recent visits.  He has had no chest pain, shortness of breath, edema, palpitations dizziness or lightheadedness.  BP Readings from Last 3 Encounters:  09/03/20 118/62  07/16/20 122/68  05/24/20 140/78   Type 2 diabetes mellitus: He is followed by Dr. Thresa Ross at [facility].  He is maintained on Canagliflozin-Metformin HCI ER [phone] mg daily, Ozempic Ozempic 1 mg weekly and glipizide 10 mg daily-recently increased.  He is getting labs done at [facility] clinic and labs dated 08/21/2020 reviewed: Hemoglobin A1c 7.8, B met normal with exception of glucose 150, vitamin D 34, PTH PTH 25  HLD: atorvastatin 40 mg daily tolerating well without myalgias. He is  trying to follow a healthier diet.  GOUT: Followed by Dr.  Allena Katz at Rheumatology at [facility] with uric acid 4.2 on allopurinol 200 mg daily.  He is also on colchicine 0.6 mg twice daily as needed.  Gout is quiet at this time.  He gets the gout in his right wrist, thumb, and toes.    Anxiety: Followed by Psychiatry and takes Xanax 0.5 mg maybe once a week, and Wellbutrin XL 150 mg daily  BRBPR: He underwent a colonoscopy 08/12/2020 that showed no abnormalities, and was requested 10-year surveillance.  He also had a CT of the abdomen done that revealed hepatic steatosis and borderline height hepatomegaly, no pancreatic lesion, no acute findings in the  abdomen,  OSA: PULM consult revealed mild sleep apnea.  This is the same finding from 20 years ago.  He has been sent to an orthodontist to work on a mouth  Appliance in place of CPAP.  Vitamin D deficiency: Due for recheck.  Immunizations: reviewed and UTD. Had Covid Booster Nov 2021.  Diet:Healthy Exercise: x1 per week- advised Colonoscopy: Just done this month- repeat in 10 years Vision: DUE now- advised- sched for [facility] Dental: UTD crown on Friday PSA: 0.43 05/24/20  Past Medical History:  Diagnosis Date  . Asthma   . Diabetes mellitus without complication (HCC)   . Fatty liver 09/03/2020  . Heart murmur   . Hyperlipidemia   . Hypertension     Past Surgical History:  Procedure Laterality Date  . BACK SURGERY     cervical  . KNEE SURGERY Right   . ROTATOR CUFF REPAIR    . WISDOM TOOTH EXTRACTION      Family History  Problem Relation Age of Onset  . Heart attack Father   . Alcohol abuse Father   . Drug abuse Father   . Early death Father   . Heart disease Father   . Hypertension Mother     Social History   Socioeconomic History  . Marital status: Married    Spouse name: Not on file  . Number of children: Not on file  . Years of education: Not on file  . Highest education level: Not on  file  Occupational History  . Occupation: IT   Tobacco Use  . Smoking status: Former Smoker    Packs/day: 0.50    Years: 20.00    Pack years: 10.00    Types: Cigarettes    Quit date: 06/11/2018    Years since quitting: 2.2  . Smokeless tobacco: Former Neurosurgeon    Types: Snuff    Quit date: Equities trader  . Vaping Use: Some days  . Devices: x3 per day to quit smoking nicotine   Substance and Sexual Activity  . Alcohol use: Yes    Comment: beer up to 6 week and none recently  . Drug use: No  . Sexual activity: Yes    Partners: Female  Other Topics Concern  . Not on file  Social History Narrative   Married and lives with daughter    Social Determinants of Health    Financial Resource Strain:   . Difficulty of Paying Living Expenses: Not on file  Food Insecurity:   . Worried About Programme researcher, broadcasting/film/video in the Last Year: Not on file  . Ran Out of Food in the Last Year: Not on file  Transportation Needs:   . Lack of Transportation (Medical): Not on file  . Lack of Transportation (Non-Medical): Not on file  Physical Activity:   . Days of Exercise per Week: Not on file  . Minutes of Exercise per Session: Not on file  Stress:   . Feeling of Stress : Not on file  Social Connections:   . Frequency of Communication with Friends and Family: Not on file  . Frequency of Social Gatherings with Friends and Family: Not on file  . Attends Religious Services: Not on file  . Active Member of Clubs or Organizations: Not on file  . Attends Banker Meetings: Not on file  . Marital Status: Not on file  Intimate Partner Violence:   . Fear of Current or Ex-Partner: Not on file  . Emotionally Abused: Not on file  . Physically Abused: Not on file  . Sexually Abused: Not on file    Outpatient Medications Prior to Visit  Medication Sig Dispense Refill  . allopurinol (ZYLOPRIM) 100 MG tablet 2 tabs daily, 90 days    . ALPRAZolam (XANAX) 0.5 MG tablet Take by mouth.    Marland Kitchen aspirin 81 MG tablet Take 81 mg by mouth daily.    . Blood Glucose Monitoring Suppl (ONETOUCH VERIO FLEX SYSTEM) w/Device KIT by Does not apply route.    Marland Kitchen buPROPion (WELLBUTRIN XL) 150 MG 24 hr tablet Take by mouth.    . Canagliflozin-metFORMIN HCl ER (INVOKAMET XR) [phone] MG TB24 Take by mouth.    . cetirizine (ZYRTEC) 10 MG tablet Take 10 mg by mouth daily.    . colchicine 0.6 MG tablet Take 2 tablets (1.2mg ) by mouth at first sign of gout flare followed by 1 tablet (0.6mg ) after 1 hour. (Max 1.8mg  within 1 hour)    . glipiZIDE (GLUCOTROL XL) 5 MG 24 hr tablet Take 10 mg by mouth.    Marland Kitchen ketoconazole (NIZORAL) 2 % cream APPLY TOPICALLY TWICE A DAY AS NEEDED    . Melatonin 10 MG CAPS  Take by mouth.    . meloxicam (MOBIC) 7.5 MG tablet Take 1 tablet (7.5 mg total) by mouth daily. 30 tablet 0  . Multiple Vitamin (MULTI-VITAMIN) tablet Take by mouth.    Marland Kitchen omeprazole (PRILOSEC) 40 MG capsule once daily TAKE 1 CAPSULE TWICE A  DAY    . OneTouch Delica Lancets 33G MISC by Does not apply route.    Letta Pate VERIO test strip     . OZEMPIC, 1 MG/DOSE, 4 MG/3ML SOPN Inject 3 mLs into the skin once a week.    Marland Kitchen PROAIR HFA 108 (90 Base) MCG/ACT inhaler     . traZODone (DESYREL) 50 MG tablet Take by mouth.    Marland Kitchen atorvastatin (LIPITOR) 40 MG tablet Take 1 tablet (40 mg total) by mouth daily. 90 tablet 1  . lisinopril (ZESTRIL) 40 MG tablet Take 1 tablet (40 mg total) by mouth daily. 90 tablet 1   No facility-administered medications prior to visit.    No Known Allergies  Review of Systems  Constitutional: Negative.   HENT: Negative.   Eyes: Negative.   Respiratory: Negative.   Cardiovascular: Negative.   Gastrointestinal: Negative.   Endocrine: Negative.   Genitourinary: Negative.   Musculoskeletal: Negative.   Skin: Negative.   Neurological: Negative.   Hematological: Negative.   Psychiatric/Behavioral:       Reports stable depression anxiety.  No SI or HI.  Followed by psychiatry and now getting every 60-month appointments.      Objective:    Physical Exam Vitals reviewed.  Constitutional:      Appearance: He is obese.  HENT:     Head: Normocephalic and atraumatic.  Cardiovascular:     Rate and Rhythm: Normal rate and regular rhythm.     Pulses: Normal pulses.     Heart sounds: Normal heart sounds.  Pulmonary:     Effort: Pulmonary effort is normal.     Breath sounds: Normal breath sounds.  Abdominal:     General: Abdomen is flat.     Palpations: Abdomen is soft.     Tenderness: There is no abdominal tenderness.  Musculoskeletal:        General: Normal range of motion.     Cervical back: Normal range of motion and neck supple.  Skin:    General: Skin  is warm and dry.  Neurological:     General: No focal deficit present.     Mental Status: He is alert and oriented to person, place, and time.  Psychiatric:        Mood and Affect: Mood normal.        Behavior: Behavior normal.     BP 118/62 (BP Location: Left Arm, Patient Position: Sitting, Cuff Size: Normal)   Pulse 86   Temp 97.9 F (36.6 C) (Oral)   Ht 5\' 10"  (1.778 m)   Wt 232 lb (105.2 kg)   SpO2 97%   BMI 33.29 kg/m  Wt Readings from Last 3 Encounters:  09/03/20 232 lb (105.2 kg)  07/16/20 233 lb 9.6 oz (106 kg)  05/24/20 241 lb (109.3 kg)   Pulse Readings from Last 3 Encounters:  09/03/20 86  07/16/20 96  05/24/20 94    BP Readings from Last 3 Encounters:  09/03/20 118/62  07/16/20 122/68  05/24/20 140/78    No results found for: CHOL, HDL, LDLCALC, LDLDIRECT, TRIG, CHOLHDL  Lab Results  Component Value Date   WBC 7.2 06/17/2018   HGB 14.9 06/17/2018   HCT 45.4 06/17/2018   MCV 86.0 06/17/2018   PLT 251 06/17/2018     Health Maintenance Due  Topic Date Due  . Hepatitis C Screening  Never done    There are no preventive care reminders to display for this patient.  No results found for: TSH  Assessment & Plan:   Problem List Items Addressed This Visit      Cardiovascular and Mediastinum   Hypertension associated with diabetes (HCC) - Primary   Relevant Medications   atorvastatin (LIPITOR) 40 MG tablet   lisinopril (ZESTRIL) 40 MG tablet   Other Relevant Orders   Referral to Chronic Care Management Services   Comprehensive metabolic panel   Microalbumin / creatinine urine ratio   CBC with Differential/Platelet     Digestive   Fatty liver     Endocrine   Type 2 diabetes mellitus without complication, without long-term current use of insulin (HCC)   Relevant Medications   atorvastatin (LIPITOR) 40 MG tablet   lisinopril (ZESTRIL) 40 MG tablet   Other Relevant Orders   Referral to Chronic Care Management Services     Other    Hyperlipidemia LDL goal <100   Relevant Medications   atorvastatin (LIPITOR) 40 MG tablet   lisinopril (ZESTRIL) 40 MG tablet   Other Relevant Orders   Referral to Chronic Care Management Services   Lipid panel    Other Visit Diagnoses    Encounter for HCV screening test for low risk patient       Relevant Orders   Hepatitis C antibody   Screening for HIV (human immunodeficiency virus)       Relevant Orders   HIV Antibody (routine testing w rflx)   Low vitamin D level       Relevant Orders   VITAMIN D 25 Hydroxy (Vit-D Deficiency, Fractures)   Low serum vitamin B12       Relevant Orders   B12 and Folate Panel      Meds ordered this encounter  Medications  . atorvastatin (LIPITOR) 40 MG tablet    Sig: Take 1 tablet (40 mg total) by mouth daily.    Dispense:  90 tablet    Refill:  1    Order Specific Question:   Supervising Provider    Answer:   Dale [place] [id number]  . lisinopril (ZESTRIL) 40 MG tablet    Sig: Take 1 tablet (40 mg total) by mouth daily.    Dispense:  90 tablet    Refill:  1    Order Specific Question:   Supervising Provider    Answer:   Dale [place] [[id number]]    Follow-up: Return in about 3 months (around 12/02/2020).   Please go to the lab today. Doing well overall. No specific complaints. Healthy diet and exercise discussed.   I have placed a referral in to [facility]- chronic care management.   No medication changes today.   Consider switching your lisinopril to losartan in the future to avoid allergic reaction some providers recommend this- others do not.   Continue to follow up with your specialists.   Amedeo Kinsman, NP   This visit occurred during the SARS-CoV-2 public health emergency.  Safety protocols were in place, including screening questions prior to the visit, additional usage of staff PPE, and extensive cleaning of exam room while observing appropriate contact time as indicated for disinfecting solutions.

## 2020-09-04 ENCOUNTER — Other Ambulatory Visit (INDEPENDENT_AMBULATORY_CARE_PROVIDER_SITE_OTHER): Payer: 59

## 2020-09-04 ENCOUNTER — Telehealth: Payer: Self-pay

## 2020-09-04 ENCOUNTER — Other Ambulatory Visit: Payer: Self-pay

## 2020-09-04 DIAGNOSIS — E1159 Type 2 diabetes mellitus with other circulatory complications: Secondary | ICD-10-CM

## 2020-09-04 DIAGNOSIS — I152 Hypertension secondary to endocrine disorders: Secondary | ICD-10-CM

## 2020-09-04 LAB — CBC WITH DIFFERENTIAL/PLATELET
Basophils Absolute: 0.1 10*3/uL (ref 0.0–0.1)
Basophils Relative: 1.1 % (ref 0.0–3.0)
Eosinophils Absolute: 0.3 10*3/uL (ref 0.0–0.7)
Eosinophils Relative: 4.7 % (ref 0.0–5.0)
HCT: 42.9 % (ref 39.0–52.0)
Hemoglobin: 13.9 g/dL (ref 13.0–17.0)
Lymphocytes Relative: 50.6 % — ABNORMAL HIGH (ref 12.0–46.0)
Lymphs Abs: 3.1 10*3/uL (ref 0.7–4.0)
MCHC: 32.3 g/dL (ref 30.0–36.0)
MCV: 84.9 fl (ref 78.0–100.0)
Monocytes Absolute: 0.4 10*3/uL (ref 0.1–1.0)
Monocytes Relative: 7.2 % (ref 3.0–12.0)
Neutro Abs: 2.2 10*3/uL (ref 1.4–7.7)
Neutrophils Relative %: 36.4 % — ABNORMAL LOW (ref 43.0–77.0)
Platelets: 335 10*3/uL (ref 150.0–400.0)
RBC: 5.05 Mil/uL (ref 4.22–5.81)
RDW: 14.6 % (ref 11.5–15.5)
WBC: 6 10*3/uL (ref 4.0–10.5)

## 2020-09-04 LAB — HIV ANTIBODY (ROUTINE TESTING W REFLEX): HIV 1&2 Ab, 4th Generation: NONREACTIVE

## 2020-09-04 LAB — HEPATITIS C ANTIBODY
Hepatitis C Ab: NONREACTIVE
SIGNAL TO CUT-OFF: 0.01 (ref <1.00)

## 2020-09-04 LAB — MICROALBUMIN / CREATININE URINE RATIO
Creatinine,U: 49.8 mg/dL
Microalb Creat Ratio: 1.4 mg/g (ref 0.0–30.0)
Microalb, Ur: <0.7 mg/dL (ref 0.0–1.9)

## 2020-09-04 NOTE — Chronic Care Management (AMB) (Signed)
Care Management   Outreach Note  09/04/2020 Name: Kevin Huerta MRN: [id number] DOB: 02/15/1966  Referred by: Theadore Nan, NP Reason for referral : Care Coordination (Outreach to schedule referral for Pharm D )   An unsuccessful telephone outreach was attempted today. The patient was referred to the case management team for assistance with care management and care coordination.   Follow Up Plan: A HIPAA compliant phone message was left for the patient providing contact information and requesting a return call.  The care management team will reach out to the patient again over the next 5 days.  If patient returns call to provider office, please advise to call Embedded Care Management Care Guide Penne Lash  at [phone]  Penne Lash, RMA Care Guide, Embedded Care Coordination [facility]  [place], [place] [address] Direct Dial: [phone] [email or web address]

## 2020-09-04 NOTE — Progress Notes (Signed)
Your screening test for  HIV was negative .  This  screen is now recommended by the [facility] for Disease Control) for all adults at least once (more often if not in a monogamous relationship.)   Please confirm that he saw the other comments  by Anell Barr,   Duncan Dull, MD   (for Fransico Setters)

## 2020-09-16 NOTE — Chronic Care Management (AMB) (Signed)
Care Management   Note  09/16/2020 Name: Kevin Huerta MRN: [id number] DOB: 1966-07-09  [name] is a 54 y.o. year old male who is a primary care patient of Theadore Nan, NP. I reached out to Duard Larsen by phone today in response to a referral sent by Mr. Dwyane Pruette health plan.    [name] was given information about care management services today including:  1. Care management services include personalized support from designated clinical staff supervised by his physician, including individualized plan of care and coordination with other care providers 2. 24/7 contact phone numbers for assistance for urgent and routine care needs. 3. The patient may stop care management services at any time by phone call to the office staff.  Patient agreed to services and verbal consent obtained.   Follow up plan: Telephone appointment with care management team member scheduled for:10/08/2019  Penne Lash, RMA Care Guide, Embedded Care Coordination [facility]  [place], [place] [address] Direct Dial: [phone] [email or web address]

## 2020-10-07 ENCOUNTER — Ambulatory Visit: Payer: 59 | Admitting: Pharmacist

## 2020-10-07 DIAGNOSIS — E119 Type 2 diabetes mellitus without complications: Secondary | ICD-10-CM

## 2020-10-07 DIAGNOSIS — E785 Hyperlipidemia, unspecified: Secondary | ICD-10-CM

## 2020-10-07 DIAGNOSIS — M1A00X0 Idiopathic chronic gout, unspecified site, without tophus (tophi): Secondary | ICD-10-CM

## 2020-10-07 LAB — HM DIABETES EYE EXAM

## 2020-10-07 MED ORDER — FREESTYLE LIBRE 2 SENSOR MISC: 11 refills | Status: DC

## 2020-10-07 NOTE — Patient Instructions (Signed)
Visit Information  Patient Care Plan: Medication Management    Problem Identified: Diabetes, Hyperlipidemia, HTN     Long-Range Goal: Disease Progression Prevention   This Visit's Progress: On track  Priority: High  Note:   Current Barriers:  . Unable to independently monitor therapeutic efficacy . Unable to achieve control of diabetes   Pharmacist Clinical Goal(s):  Marland Kitchen Over the next 90 days, patient will achieve adherence to monitoring guidelines and medication adherence to achieve therapeutic efficacy through collaboration with PharmD and provider.   Interventions: . 1:1 collaboration with covering provider, Marikay Alar, MD regarding development and update of comprehensive plan of care as evidenced by provider attestation and co-signature . Inter-disciplinary care team collaboration (see longitudinal plan of care) . Comprehensive medication review performed; medication list updated in electronic medical record  Diabetes: . Uncontrolled; current treatment: Ozempic 1 mg weekly, Invokamet XR 150/1000 mg 2 tabs QAM, glipizide XL 10 mg QAM - reports he has been taking 10 mg BID since last appt w/ Dr. Gershon Crane  . Current glucose readings: not rechecking. He notes he just isn't motivated to check his sugars. Referred to me by Fransico Setters to discuss eligibility for CGM/ . Denies hypoglycemic symptoms . Reviewed microvascular and macrovascular complications of diabetes. Reviewed goal A1c, goal fasting, and goal 2 hour post prandial glucose . Discussed benefit of CGM. Patient interested in seeing if insurance will cover. Script sent.   Hypertension: . Controlled; current treatment: lisinopril 40 mg daily   . Recommend to continue current regimen at this time. Could consider change to losartan for uric acid lowering benefit in the future  Hyperlipidemia: . Uncontrolled; current treatment: atorvastatin 40 mg daily   . Recommend increasing atorvastatin to 80 mg daily. Will defer adjustment  at this time as he establishes with a new PCP next wekel.   Gout: . Controlled per Care Everywhere lab work (uric acid 6.2) current regimen: allopurinol 200 mg daily, colchicine PRN gout flare. He notes that he just recently finished a course of colchicine d/t flare . Continue current regimen at this time.   Depression/Anxiety: . Appropriately managed per patient; current treatment: bupropion XL 150 mg daily, trazodone 50 mg QPM PRN sleep, alprazolam 0.5 mg PRN - patient reports use ~ 3 times weekly . Continue current regimen and collaboration with psychiatry   Patient Goals/Self-Care Activities . Over the next 90 days, patient will:  - take medications as prescribed check glucose TID using CGM, document, and provide at future appointments  Follow Up Plan: Telephone follow up appointment with care management team member scheduled for: 1 day to f/u on coverage       Mr. Hruska was given information about Chronic Care Management services today including:  1. CCM service includes personalized support from designated clinical staff supervised by his physician, including individualized plan of care and coordination with other care providers 2. 24/7 contact phone numbers for assistance for urgent and routine care needs. 3. The patient may stop CCM services at any time (effective at the end of the month) by phone call to the office staff.  Patient agreed to services and verbal consent obtained.   The patient verbalized understanding of instructions, educational materials, and care plan provided today and declined offer to receive copy of patient instructions, educational materials, and care plan.   Plan: Telephone follow up appointment with care management team member scheduled for: 1 day  Catie Feliz Beam, PharmD, [place], CPP Clinical Pharmacist [facility] [employer] [facility] [phone]

## 2020-10-07 NOTE — Addendum Note (Signed)
Addended by: Lourena Simmonds on: 10/07/2020 02:15 PM   Modules accepted: Orders

## 2020-10-07 NOTE — Chronic Care Management (AMB) (Signed)
Care Management   Pharmacy Note  10/07/2020 Name: Kevin Huerta MRN: [id number] DOB: 1966-03-06  Kevin Huerta is a 55 y.o. year old male who is a primary care patient of Theadore Nan, NP, though collaborating with covering provider Marikay Alar, MD. The Care Management/Care Coordination team team was consulted for assistance with care management and care coordination needs.    Engaged with patient by telephone for initial visit in response to provider referral for pharmacy case management and/or care coordination services.   Consent to Services:  Patient was given the following information about care management and care coordination services today, agreed to services, and gave verbal consent: 1.care management/care coordination services include personalized support from designated clinical staff supervised by their physician, including individualized plan of care and coordination with other care providers 2. 24/7 contact phone numbers for assistance for urgent and routine care needs. 3. The patient may stop care management/care coordination services at any time by phone call to the office staff.  Review of patient status, including review of consultants reports, laboratory and other test data, was performed as part of comprehensive evaluation and provision of chronic care management services.   SDOH (Social Determinants of Health) assessments and interventions performed:  SDOH Interventions   Flowsheet Row Most Recent Value  SDOH Interventions   Financial Strain Interventions Intervention Not Indicated       Objective:  Lab Results  Component Value Date   CREATININE 1.02 09/03/2020   CREATININE 1.16 05/24/2020   CREATININE 0.94 03/21/2020   Last A1c 7.8% (08/21/20) [facility]     Component Value Date/Time   CHOL 173 09/03/2020 1154   TRIG 127.0 09/03/2020 1154   HDL 41.50 09/03/2020 1154   CHOLHDL 4 09/03/2020 1154   VLDL 25.4 09/03/2020 1154   LDLCALC 106 (H)  09/03/2020 1154     Clinical ASCVD: No  The 10-year ASCVD risk score Denman George DC Jr., et al., 2013) is: 16.8%   Values used to calculate the score:     Age: 41 years     Sex: Male     Is Non-Hispanic African American: Yes     Diabetic: Yes     Tobacco smoker: No     Systolic Blood Pressure: 118 mmHg     Is BP treated: Yes     HDL Cholesterol: 41.5 mg/dL     Total Cholesterol: 173 mg/dL    Last uric acid 4.2 (07/02/20) [facility]  BP Readings from Last 3 Encounters:  09/03/20 118/62  07/16/20 122/68  05/24/20 140/78    Care Plan  No Known Allergies  Medications Reviewed Today    Reviewed by Lourena Simmonds, RPH-CPP (Pharmacist) on 10/07/20 at 1112  Med List Status: <None>  Medication Order Taking? Sig Documenting Provider Last Dose Status Informant  allopurinol (ZYLOPRIM) 100 MG tablet [id number] Yes Take 200 mg by mouth daily. [provider] Taking Active   ALPRAZolam Prudy Feeler) 0.5 MG tablet [id number] Yes Take 0.5 mg by mouth as needed. [provider] Taking Active   aspirin 81 MG tablet [id number] Yes Take 81 mg by mouth daily. [provider] Taking Active   atorvastatin (LIPITOR) 40 MG tablet [id number] Yes Take 1 tablet (40 mg total) by mouth daily. Theadore Nan, NP Taking Active   Blood Glucose Monitoring Suppl (ONETOUCH VERIO FLEX SYSTEM) w/Device KIT [id number] No by Does not apply route.  Patient not taking: Reported on 10/07/2020   [provider] Not Taking Active   buPROPion [facility]  XL) 150 MG 24 hr tablet [id number] Yes Take 150 mg by mouth daily. [provider] Taking Active   Canagliflozin-metFORMIN HCl ER [phone] MG TB24 [id number] Yes Take 2 tablets by mouth daily. [provider] Taking Active   cetirizine (ZYRTEC) 10 MG tablet [id number] Yes Take 10 mg by mouth daily. [provider] Taking Active   colchicine 0.6 MG tablet [id number] Yes Take by mouth daily. [provider] Taking  Active   glipiZIDE (GLUCOTROL XL) 10 MG 24 hr tablet [id number] Yes Take 10 mg by mouth. [provider] Taking Active            Med Note Feliz Beam, Jori Moll Oct 07, 2020 11:09 AM) 10 mg BID  ketoconazole (NIZORAL) 2 % cream [id number]  APPLY TOPICALLY TWICE A DAY AS NEEDED [provider]  Active   lisinopril (ZESTRIL) 40 MG tablet [id number] Yes Take 1 tablet (40 mg total) by mouth daily. Theadore Nan, NP Taking Active   Melatonin 10 MG CAPS [id number] Yes Take 10 mg by mouth every evening. [provider] Taking Active   meloxicam (MOBIC) 7.5 MG tablet [id number] No Take 1 tablet (7.5 mg total) by mouth daily.  Patient not taking: Reported on 10/07/2020   Helane Gunther, DPM Not Taking Active   Multiple Vitamin (MULTI-VITAMIN) tablet [id number] Yes Take by mouth. [provider] Taking Active   omeprazole (PRILOSEC) 40 MG capsule [id number] Yes Take 40 mg by mouth in the morning and at bedtime. [provider] Taking Active   OneTouch Delica Lancets 33G MISC [id number] Yes by Does not apply route. [provider] Taking Active   [facility] VERIO test strip [id number] Yes  [provider] Taking Active   OZEMPIC, 1 MG/DOSE, 4 MG/3ML SOPN [id number] Yes Inject 1 mg into the skin once a week. [provider] Taking Active   PROAIR HFA [phone] Base) MCG/ACT inhaler [id number] Yes as needed. [provider] Taking Active   traZODone (DESYREL) 50 MG tablet [id number] Yes Take 50 mg by mouth at bedtime. [provider] Taking Active           Patient Active Problem List   Diagnosis Date Noted  . Fatty liver 09/03/2020  . Prostate cancer screening 05/24/2020  . BRBPR (bright red blood per rectum) 05/24/2020  . Need for immunization against influenza 05/24/2020  . Left wrist pain 01/29/2020  . 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 Establishing care with new doctor, encounter for 02/21/2018  . 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 Hypersomnia 07/17/2014  . OSA (obstructive sleep apnea) 07/17/2014  . Snoring 07/17/2014  . Cervical radiculopathy 01/22/2011  . Hypertension associated with diabetes (HCC) 01/22/2011  . Type 2 diabetes mellitus without complication, without long-term current use of insulin (HCC) 01/22/2011  . Eczema 04/15/2010  . Hyperlipidemia LDL goal <100 03/13/2010  . Anxiety 12/17/2009  . Allergic rhinitis 07/30/2009  . Asthma 07/29/2009  . Depression 03/31/2007  . Gout 03/31/2007  . Herpes simplex 03/31/2007    Conditions to be addressed/monitored per PCP order: HLD and DMII  Patient Care Plan: Medication Management    Problem Identified: Diabetes, Hyperlipidemia, HTN     Long-Range Goal: Disease Progression Prevention   This Visit's Progress: On track  Priority: High  Note:   Current Barriers:  . Unable to independently monitor therapeutic efficacy . Unable to achieve control of diabetes   Pharmacist Clinical Goal(s):  Marland Kitchen Over the next 90 days, patient will  achieve adherence to monitoring guidelines and medication adherence to achieve therapeutic efficacy through collaboration with PharmD and provider.   Interventions: . 1:1 collaboration with covering provider, Marikay Alar, MD regarding development and update of comprehensive plan of care as evidenced by provider attestation and co-signature . Inter-disciplinary care team collaboration (see longitudinal plan of care) . Comprehensive medication review performed; medication list updated in electronic medical record  Diabetes: . Uncontrolled; current treatment: Ozempic 1 mg weekly, Invokamet XR 150/1000 mg 2 tabs QAM, glipizide XL 10 mg QAM - reports he has been taking 10 mg BID since last appt w/ Dr. Gershon Crane  . Current glucose readings: not rechecking. He notes he just isn't motivated to check his sugars. Referred to me by Fransico Setters to discuss eligibility for CGM/ . Denies hypoglycemic  symptoms . Reviewed microvascular and macrovascular complications of diabetes. Reviewed goal A1c, goal fasting, and goal 2 hour post prandial glucose . Discussed benefit of CGM. Patient interested in seeing if insurance will cover. Script sent.   Hypertension: . Controlled; current treatment: lisinopril 40 mg daily   . Recommend to continue current regimen at this time. Could consider change to losartan for uric acid lowering benefit in the future  Hyperlipidemia: . Uncontrolled; current treatment: atorvastatin 40 mg daily   . Recommend increasing atorvastatin to 80 mg daily. Will defer adjustment at this time as he establishes with a new PCP next wekel.   Gout: . Controlled per Care Everywhere lab work (uric acid 6.2) current regimen: allopurinol 200 mg daily, colchicine PRN gout flare. He notes that he just recently finished a course of colchicine d/t flare . Continue current regimen at this time.   Depression/Anxiety: . Appropriately managed per patient; current treatment: bupropion XL 150 mg daily, trazodone 50 mg QPM PRN sleep, alprazolam 0.5 mg PRN - patient reports use ~ 3 times weekly . Continue current regimen and collaboration with psychiatry   Patient Goals/Self-Care Activities . Over the next 90 days, patient will:  - take medications as prescribed check glucose TID using CGM, document, and provide at future appointments  Follow Up Plan: Telephone follow up appointment with care management team member scheduled for: 1 day to f/u on coverage       Medication Assistance: None required. Patient affirms current coverage meets needs.   Plan: Telephone follow up appointment with care management team member scheduled for: 1 day  Catie Feliz Beam, PharmD, [place], CPP Clinical Pharmacist [facility] [employer] [phone]

## 2020-10-07 NOTE — Chronic Care Management (AMB) (Signed)
ADDENDUM: Patient's plan covered Libre 2 sensors, copay is $60/1 month supply at [facility].   Contacted patient. He asked if they would be any cheaper at [facility]. At his request, sent order to [facility] as well, and he will follow up on cost. He will plan on trying this for a little while to help aid w/ glycemic management  Will forward note to patient's next PCP who he establishes with next week.   Catie Feliz Beam, PharmD, [place], CPP Clinical Pharmacist [facility] [employer] [phone]

## 2020-10-08 ENCOUNTER — Other Ambulatory Visit: Payer: 59

## 2020-10-15 ENCOUNTER — Ambulatory Visit: Payer: 59 | Admitting: Family Medicine

## 2020-10-15 ENCOUNTER — Encounter: Payer: Self-pay | Admitting: Family Medicine

## 2020-10-15 ENCOUNTER — Other Ambulatory Visit: Payer: Self-pay

## 2020-10-15 VITALS — BP 120/70 | HR 90 | Temp 97.9°F | Ht 70.0 in | Wt 235.5 lb

## 2020-10-15 DIAGNOSIS — F411 Generalized anxiety disorder: Secondary | ICD-10-CM

## 2020-10-15 DIAGNOSIS — E1159 Type 2 diabetes mellitus with other circulatory complications: Secondary | ICD-10-CM

## 2020-10-15 DIAGNOSIS — G4733 Obstructive sleep apnea (adult) (pediatric): Secondary | ICD-10-CM

## 2020-10-15 DIAGNOSIS — E119 Type 2 diabetes mellitus without complications: Secondary | ICD-10-CM | POA: Diagnosis not present

## 2020-10-15 DIAGNOSIS — E785 Hyperlipidemia, unspecified: Secondary | ICD-10-CM

## 2020-10-15 DIAGNOSIS — E1169 Type 2 diabetes mellitus with other specified complication: Secondary | ICD-10-CM

## 2020-10-15 DIAGNOSIS — I152 Hypertension secondary to endocrine disorders: Secondary | ICD-10-CM

## 2020-10-15 DIAGNOSIS — J4520 Mild intermittent asthma, uncomplicated: Secondary | ICD-10-CM

## 2020-10-15 DIAGNOSIS — G4700 Insomnia, unspecified: Secondary | ICD-10-CM | POA: Insufficient documentation

## 2020-10-15 DIAGNOSIS — Z87891 Personal history of nicotine dependence: Secondary | ICD-10-CM | POA: Insufficient documentation

## 2020-10-15 MED ORDER — FREESTYLE LIBRE 2 SENSOR MISC: 11 refills | Status: DC

## 2020-10-15 MED ORDER — ATORVASTATIN CALCIUM 80 MG PO TABS: 80.0000 mg | ORAL_TABLET | Freq: Every day | ORAL | 3 refills | Status: DC

## 2020-10-15 NOTE — Assessment & Plan Note (Signed)
Follows with Dr. Gershon Crane. Cont ozempic 1 mg (notes he occasionally forgets to take this), glipizide, and canagliflozin-metformin [id number] mg daily

## 2020-10-15 NOTE — Assessment & Plan Note (Addendum)
Sees Dr. Christell Constant who prescribes xanax and wellbutrin. Cont therapy and psych support. PHQ-9 elevated partially 2/2 to sleep concerns.

## 2020-10-15 NOTE — Assessment & Plan Note (Signed)
Cont albuterol prn. Cont zyrtec 10 mg

## 2020-10-15 NOTE — Assessment & Plan Note (Signed)
BP at goal. Con lisinopril 40 mg

## 2020-10-15 NOTE — Assessment & Plan Note (Signed)
LDL not at goal. Advised increasing atorvastatin to 80 mg

## 2020-10-15 NOTE — Patient Instructions (Signed)
Diabetes Mellitus and Nutrition, Adult When you have diabetes, or diabetes mellitus, it is very important to have healthy eating habits because your blood sugar (glucose) levels are greatly affected by what you eat and drink. Eating healthy foods in the right amounts, at about the same times every day, can help you:  Control your blood glucose.  Lower your risk of heart disease.  Improve your blood pressure.  Reach or maintain a healthy weight. What can affect my meal plan? Every person with diabetes is different, and each person has different needs for a meal plan. Your health care provider may recommend that you work with a dietitian to make a meal plan that is best for you. Your meal plan may vary depending on factors such as:  The calories you need.  The medicines you take.  Your weight.  Your blood glucose, blood pressure, and cholesterol levels.  Your activity level.  Other health conditions you have, such as heart or kidney disease. How do carbohydrates affect me? Carbohydrates, also called carbs, affect your blood glucose level more than any other type of food. Eating carbs naturally raises the amount of glucose in your blood. Carb counting is a method for keeping track of how many carbs you eat. Counting carbs is important to keep your blood glucose at a healthy level, especially if you use insulin or take certain oral diabetes medicines. It is important to know how many carbs you can safely have in each meal. This is different for every person. Your dietitian can help you calculate how many carbs you should have at each meal and for each snack. How does alcohol affect me? Alcohol can cause a sudden decrease in blood glucose (hypoglycemia), especially if you use insulin or take certain oral diabetes medicines. Hypoglycemia can be a life-threatening condition. Symptoms of hypoglycemia, such as sleepiness, dizziness, and confusion, are similar to symptoms of having too much  alcohol.  Do not drink alcohol if: ? Your health care provider tells you not to drink. ? You are pregnant, may be pregnant, or are planning to become pregnant.  If you drink alcohol: ? Do not drink on an empty stomach. ? Limit how much you use to:  0-1 drink a day for women.  0-2 drinks a day for men. ? Be aware of how much alcohol is in your drink. In the [place] one drink equals one 12 oz bottle of beer (355 mL), one 5 oz glass of wine (148 mL), or one 1 oz glass of hard liquor (44 mL). ? Keep yourself hydrated with water, diet soda, or unsweetened iced tea.  Keep in mind that regular soda, juice, and other mixers may contain a lot of sugar and must be counted as carbs. What are tips for following this plan? Reading food labels  Start by checking the serving size on the "Nutrition Facts" label of packaged foods and drinks. The amount of calories, carbs, fats, and other nutrients listed on the label is based on one serving of the item. Many items contain more than one serving per package.  Check the total grams (g) of carbs in one serving. You can calculate the number of servings of carbs in one serving by dividing the total carbs by 15. For example, if a food has 30 g of total carbs per serving, it would be equal to 2 servings of carbs.  Check the number of grams (g) of saturated fats and trans fats in one serving. Choose foods that have   a low amount or none of these fats.  Check the number of milligrams (mg) of salt (sodium) in one serving. Most people should limit total sodium intake to less than 2,300 mg per day.  Always check the nutrition information of foods labeled as "low-fat" or "nonfat." These foods may be higher in added sugar or refined carbs and should be avoided.  Talk to your dietitian to identify your daily goals for nutrients listed on the label. Shopping  Avoid buying canned, pre-made, or processed foods. These foods tend to be high in fat, sodium, and added  sugar.  Shop around the outside edge of the grocery store. This is where you will most often find fresh fruits and vegetables, bulk grains, fresh meats, and fresh dairy. Cooking  Use low-heat cooking methods, such as baking, instead of high-heat cooking methods like deep frying.  Cook using healthy oils, such as olive, canola, or sunflower oil.  Avoid cooking with butter, cream, or high-fat meats. Meal planning  Eat meals and snacks regularly, preferably at the same times every day. Avoid going long periods of time without eating.  Eat foods that are high in fiber, such as fresh fruits, vegetables, beans, and whole grains. Talk with your dietitian about how many servings of carbs you can eat at each meal.  Eat 4-6 oz (112-168 g) of lean protein each day, such as lean meat, chicken, fish, eggs, or tofu. One ounce (oz) of lean protein is equal to: ? 1 oz (28 g) of meat, chicken, or fish. ? 1 egg. ?  cup (62 g) of tofu.  Eat some foods each day that contain healthy fats, such as avocado, nuts, seeds, and fish.   What foods should I eat? Fruits Berries. Apples. Oranges. Peaches. Apricots. Plums. Grapes. Mango. Papaya. Pomegranate. Kiwi. Cherries. Vegetables Lettuce. Spinach. Leafy greens, including kale, chard, collard greens, and mustard greens. Beets. Cauliflower. Cabbage. Broccoli. Carrots. Green beans. Tomatoes. Peppers. Onions. Cucumbers. Brussels sprouts. Grains Whole grains, such as whole-wheat or whole-grain bread, crackers, tortillas, cereal, and pasta. Unsweetened oatmeal. Quinoa. Brown or wild rice. Meats and other proteins Seafood. Poultry without skin. Lean cuts of poultry and beef. Tofu. Nuts. Seeds. Dairy Low-fat or fat-free dairy products such as milk, yogurt, and cheese. The items listed above may not be a complete list of foods and beverages you can eat. Contact a dietitian for more information. What foods should I avoid? Fruits Fruits canned with  syrup. Vegetables Canned vegetables. Frozen vegetables with butter or cream sauce. Grains Refined white flour and flour products such as bread, pasta, snack foods, and cereals. Avoid all processed foods. Meats and other proteins Fatty cuts of meat. Poultry with skin. Breaded or fried meats. Processed meat. Avoid saturated fats. Dairy Full-fat yogurt, cheese, or milk. Beverages Sweetened drinks, such as soda or iced tea. The items listed above may not be a complete list of foods and beverages you should avoid. Contact a dietitian for more information. Questions to ask a health care provider  Do I need to meet with a diabetes educator?  Do I need to meet with a dietitian?  What number can I call if I have questions?  When are the best times to check my blood glucose? Where to find more information:  American Diabetes Association: diabetes.org  Academy of Nutrition and Dietetics: www.eatright.org  National Institute of Diabetes and Digestive and Kidney Diseases: www.niddk.nih.gov  Association of Diabetes Care and Education Specialists: www.diabeteseducator.org Summary  It is important to have healthy eating   habits because your blood sugar (glucose) levels are greatly affected by what you eat and drink.  A healthy meal plan will help you control your blood glucose and maintain a healthy lifestyle.  Your health care provider may recommend that you work with a dietitian to make a meal plan that is best for you.  Keep in mind that carbohydrates (carbs) and alcohol have immediate effects on your blood glucose levels. It is important to count carbs and to use alcohol carefully. This information is not intended to replace advice given to you by your health care provider. Make sure you discuss any questions you have with your health care provider. Document Revised: 08/29/2019 Document Reviewed: 08/29/2019 Elsevier Patient Education  2021 [employer].  

## 2020-10-15 NOTE — Progress Notes (Signed)
Subjective:     Kevin Huerta is a 55 y.o. male presenting for Establish Care     HPI  # HLD - taking medication daily - willing to increase dose  Lab Results  Component Value Date   CHOL 173 09/03/2020   HDL 41.50 09/03/2020   LDLCALC 106 (H) 09/03/2020   TRIG 127.0 09/03/2020   CHOLHDL 4 09/03/2020   #insomnia - taking trazodone w/ some improvement - waiting for his sleep apnea oral device to see if symptoms improve - does forget to take this occasionally  Review of Systems   Social History   Tobacco Use  Smoking Status Former Smoker  . Packs/day: 1.00  . Years: 25.00  . Pack years: 25.00  . Types: Cigarettes  . Quit date: 06/11/2018  . Years since quitting: 2.3  Smokeless Tobacco Former Neurosurgeon  . Types: Snuff  . Quit date: 1996        Objective:    BP Readings from Last 3 Encounters:  10/15/20 120/70  09/03/20 118/62  07/16/20 122/68   Wt Readings from Last 3 Encounters:  10/15/20 235 lb 8 oz (106.8 kg)  09/03/20 232 lb (105.2 kg)  07/16/20 233 lb 9.6 oz (106 kg)    BP 120/70   Pulse 90   Temp 97.9 F (36.6 C) (Temporal)   Ht 5\' 10"  (1.778 m)   Wt 235 lb 8 oz (106.8 kg)   SpO2 98%   BMI 33.79 kg/m    Physical Exam Constitutional:      Appearance: Normal appearance. He is not ill-appearing or diaphoretic.  HENT:     Right Ear: External ear normal.     Left Ear: External ear normal.  Eyes:     General: No scleral icterus.    Extraocular Movements: Extraocular movements intact.     Conjunctiva/sclera: Conjunctivae normal.  Cardiovascular:     Rate and Rhythm: Normal rate and regular rhythm.     Heart sounds: No murmur heard.   Pulmonary:     Effort: Pulmonary effort is normal. No respiratory distress.     Breath sounds: Normal breath sounds. No wheezing.  Musculoskeletal:     Cervical back: Neck supple.  Skin:    General: Skin is warm and dry.  Neurological:     Mental Status: He is alert. Mental status is at baseline.   Psychiatric:        Mood and Affect: Mood normal.      Flowsheet Row Office Visit from 10/15/2020 in [facility]  PHQ-9 Total Score 9          Assessment & Plan:   Problem List Items Addressed This Visit      Cardiovascular and Mediastinum   Hypertension associated with diabetes (HCC)    BP at goal. Con lisinopril 40 mg      Relevant Medications   atorvastatin (LIPITOR) 80 MG tablet     Respiratory   Asthma    Cont albuterol prn. Cont zyrtec 10 mg      OSA (obstructive sleep apnea)    Does not use CPAP. Is going to start using an oral device to help with symptoms        Endocrine   Type 2 diabetes mellitus without complication, without long-term current use of insulin (HCC)    Follows with Dr. Savannah. Cont ozempic 1 mg (notes he occasionally forgets to take this), glipizide, and canagliflozin-metformin [phone] mg daily      Relevant Medications  Continuous Blood Gluc Sensor (FREESTYLE LIBRE 2 SENSOR) MISC   atorvastatin (LIPITOR) 80 MG tablet   Hyperlipidemia associated with type 2 diabetes mellitus (HCC) - Primary    LDL not at goal. Advised increasing atorvastatin to 80 mg      Relevant Medications   atorvastatin (LIPITOR) 80 MG tablet     Other   Generalized anxiety disorder    Sees Dr. Christell Constant who prescribes xanax and wellbutrin. Cont therapy and psych support. PHQ-9 elevated partially 2/2 to sleep concerns.       History of tobacco use    20 yr pack hx. Interested in cancer screening. Referral placed.       Relevant Orders   Ambulatory Referral for Lung Cancer Scre   Insomnia    Encouraged using trazodone which is effective. He is also working to restart sleep apnea treatment.           Return in about 6 months (around 04/14/2021) for physical .  Lynnda Child, MD  This visit occurred during the SARS-CoV-2 public health emergency.  Safety protocols were in place, including screening questions prior to the visit,  additional usage of staff PPE, and extensive cleaning of exam room while observing appropriate contact time as indicated for disinfecting solutions.

## 2020-10-15 NOTE — Assessment & Plan Note (Signed)
Does not use CPAP. Is going to start using an oral device to help with symptoms

## 2020-10-15 NOTE — Assessment & Plan Note (Signed)
Encouraged using trazodone which is effective. He is also working to restart sleep apnea treatment.

## 2020-10-15 NOTE — Assessment & Plan Note (Signed)
20 yr pack hx. Interested in cancer screening. Referral placed.

## 2020-10-28 ENCOUNTER — Encounter: Payer: Self-pay | Admitting: *Deleted

## 2020-10-28 ENCOUNTER — Telehealth: Payer: Self-pay | Admitting: *Deleted

## 2020-10-28 DIAGNOSIS — Z122 Encounter for screening for malignant neoplasm of respiratory organs: Secondary | ICD-10-CM

## 2020-10-28 DIAGNOSIS — Z87891 Personal history of nicotine dependence: Secondary | ICD-10-CM

## 2020-10-28 NOTE — Telephone Encounter (Signed)
Received referral for initial lung cancer screening scan. Contacted patient and obtained smoking history,(former, quit 06/11/18, 48 pack year) as well as answering questions related to screening process. Patient denies signs of lung cancer such as weight loss or hemoptysis. Patient denies comorbidity that would prevent curative treatment if lung cancer were found. Patient is scheduled for shared decision making visit and CT scan on 11/05/20 at 215pm.

## 2020-11-05 ENCOUNTER — Other Ambulatory Visit: Payer: Self-pay

## 2020-11-05 ENCOUNTER — Ambulatory Visit
Admission: RE | Admit: 2020-11-05 | Discharge: 2020-11-05 | Disposition: A | Discharge status: Home / Self Care | Payer: 59 | Source: Ambulatory Visit | Attending: Nurse Practitioner | Admitting: Nurse Practitioner

## 2020-11-05 ENCOUNTER — Inpatient Hospital Stay: Payer: 59 | Attending: Nurse Practitioner | Admitting: Nurse Practitioner

## 2020-11-05 DIAGNOSIS — Z87891 Personal history of nicotine dependence: Secondary | ICD-10-CM

## 2020-11-05 DIAGNOSIS — Z122 Encounter for screening for malignant neoplasm of respiratory organs: Secondary | ICD-10-CM | POA: Insufficient documentation

## 2020-11-05 NOTE — Progress Notes (Signed)
Virtual Visit via Video Enabled Telemedicine Note   I connected with Kevin Huerta on 11/05/20 at 2;15 PM EST by video enabled telemedicine visit and verified that I am speaking with the correct person using two identifiers.   I discussed the limitations, risks, security and privacy concerns of performing an evaluation and management service by telemedicine and the availability of in-person appointments. I also discussed with the patient that there may be a patient responsible charge related to this service. The patient expressed understanding and agreed to proceed.   Other persons participating in the visit and their role in the encounter: Burgess Estelle, RN- checking in patient & navigation  Patient's location: [place]  Provider's location: Clinic  Chief Complaint: Low Dose CT Screening  Patient agreed to evaluation by telemedicine to discuss shared decision making for consideration of low dose CT lung cancer screening.    In accordance with CMS guidelines, patient has met eligibility criteria including age, absence of signs or symptoms of lung cancer.  Social History   Tobacco Use  . Smoking status: Former Smoker    Packs/day: 1.50    Years: 32.00    Pack years: 48.00    Types: Cigarettes    Quit date: 06/11/2018    Years since quitting: 2.4  . Smokeless tobacco: Former Systems developer    Types: Snuff    Quit date: 1996  Substance Use Topics  . Alcohol use: Yes    Comment: cut back, 6 pack per month     A shared decision-making session was conducted prior to the performance of CT scan. This includes one or more decision aids, includes benefits and harms of screening, follow-up diagnostic testing, over-diagnosis, false positive rate, and total radiation exposure.   Counseling on the importance of adherence to annual lung cancer LDCT screening, impact of co-morbidities, and ability or willingness to undergo diagnosis and treatment is imperative for compliance of the program.    Counseling on the importance of continued smoking cessation for former smokers; the importance of smoking cessation for current smokers, and information about tobacco cessation interventions have been given to patient including [place] and [facility] [place] programs.   Written order for lung cancer screening with LDCT has been given to the patient and any and all questions have been answered to the best of my abilities.    Yearly follow up will be coordinated by Burgess Estelle, Thoracic Navigator.  I discussed the assessment and treatment plan with the patient. The patient was provided an opportunity to ask questions and all were answered. The patient agreed with the plan and demonstrated an understanding of the instructions.   The patient was advised to call back or seek an in-person evaluation if the symptoms worsen or if the condition fails to improve as anticipated.   I provided 15 minutes of face-to-face video visit time dedicated to the care of this patient on the date of this encounter to include pre-visit review of smoking history, face-to-face time with the patient, and post visit ordering of testing/documentation.   Beckey Rutter, DNP, AGNP-C [place] [facility] [phone] (clinic)

## 2020-11-07 ENCOUNTER — Encounter: Payer: Self-pay | Admitting: *Deleted

## 2020-11-07 ENCOUNTER — Encounter: Payer: Self-pay | Admitting: Family Medicine

## 2020-11-07 DIAGNOSIS — I2510 Atherosclerotic heart disease of native coronary artery without angina pectoris: Secondary | ICD-10-CM | POA: Insufficient documentation

## 2020-11-07 DIAGNOSIS — I700 Atherosclerosis of aorta: Secondary | ICD-10-CM | POA: Insufficient documentation

## 2020-11-07 DIAGNOSIS — J439 Emphysema, unspecified: Secondary | ICD-10-CM | POA: Insufficient documentation

## 2021-01-16 ENCOUNTER — Telehealth: Payer: Self-pay | Admitting: *Deleted

## 2021-01-16 ENCOUNTER — Telehealth: Payer: Self-pay | Admitting: Family Medicine

## 2021-01-16 MED ORDER — LISINOPRIL 40 MG PO TABS: 40.0000 mg | ORAL_TABLET | Freq: Every day | ORAL | 1 refills | Status: DC

## 2021-01-16 NOTE — Telephone Encounter (Signed)
  LAST APPOINTMENT DATE: 10/15/2020   NEXT APPOINTMENT DATE:@7 /19/2022  MEDICATION: Lisinopril  [facility]  Let patient know to contact pharmacy at the end of the day to make sure medication is ready.  Please notify patient to allow 48-72 hours to process  Encourage patient to contact the pharmacy for refills or they can request refills through [facility]  CLINICAL FILLS OUT ALL BELOW:   LAST REFILL:  QTY:  REFILL DATE:    OTHER COMMENTS:    Okay for refill?  Please advise

## 2021-01-16 NOTE — Telephone Encounter (Signed)
Left VM (DPR) for pt, relaying Dr. Elmyra Ricks message and suggestions.

## 2021-01-16 NOTE — Telephone Encounter (Signed)
Magnesium has not been checked.   If cramping associated with prednisone would reassess and see if this improves after stopping.   In general I recommend good hydration, regular stretching and can consider magnesium supplement for cramps.   Would recommend office visit if not improving

## 2021-01-16 NOTE — Telephone Encounter (Signed)
Pt left VM at Triage. He was given a prednisone taper from his endo doc for possible gout flare up see their phone note on 01/09/21. Pt said recently he has been having more and more muscle cramps and is wondering if it's the prednisone or not. His wife mentioned that sometimes you can have a low magnesium level and that can cause muscle cramps. Pt wanted to know if he should get his magnesium level checked or if he has had it checked recently, and if PCP could advise him on what to do to help with the cramps

## 2021-01-30 ENCOUNTER — Other Ambulatory Visit: Payer: Self-pay

## 2021-01-30 ENCOUNTER — Ambulatory Visit: Payer: 59 | Admitting: Podiatry

## 2021-01-30 ENCOUNTER — Encounter: Payer: Self-pay | Admitting: Podiatry

## 2021-01-30 DIAGNOSIS — B351 Tinea unguium: Secondary | ICD-10-CM | POA: Diagnosis not present

## 2021-01-30 DIAGNOSIS — M79674 Pain in right toe(s): Secondary | ICD-10-CM | POA: Diagnosis not present

## 2021-01-30 DIAGNOSIS — E119 Type 2 diabetes mellitus without complications: Secondary | ICD-10-CM | POA: Diagnosis not present

## 2021-01-30 DIAGNOSIS — M79675 Pain in left toe(s): Secondary | ICD-10-CM

## 2021-01-30 NOTE — Progress Notes (Signed)
This patient returns to my office for at risk foot care.  This patient requires this care by a professional since this patient will be at risk due to having  diabetes.  This patient is unable to cut nails himself since the patient cannot reach his nails.These nails are painful walking and wearing shoes.  This patient presents for at risk foot care today.  General Appearance  Alert, conversant and in no acute stress.  Vascular  Dorsalis pedis and posterior tibial  pulses are palpable  bilaterally.  Capillary return is within normal limits  bilaterally. Temperature is within normal limits  bilaterally.  Neurologic  Senn-Weinstein monofilament wire test within normal limits  bilaterally. Muscle power within normal limits bilaterally.  Nails Thick disfigured discolored nails with subungual debris  from hallux to fifth toes bilaterally. No evidence of bacterial infection or drainage bilaterally.  Orthopedic  No limitations of motion  feet .  No crepitus or effusions noted.  No bony pathology or digital deformities noted.  Skin  normotropic skin with no porokeratosis noted bilaterally.  No signs of infections or ulcers noted.     Onychomycosis  Pain in right toes  Pain in left toes  Consent was obtained for treatment procedures.   Mechanical debridement of nails 1-5  bilaterally performed with a nail nipper.  Filed with dremel without incident.    Return office visit    4  months                  Told patient to return for periodic foot care and evaluation due to potential at risk complications.   Worthington Cruzan DPM   

## 2021-02-04 ENCOUNTER — Encounter: Payer: Self-pay | Admitting: Family Medicine

## 2021-02-26 ENCOUNTER — Ambulatory Visit: Payer: 59 | Admitting: Family Medicine

## 2021-02-26 ENCOUNTER — Other Ambulatory Visit: Payer: Self-pay

## 2021-02-26 VITALS — BP 110/60 | HR 88 | Temp 98.2°F | Ht 70.0 in | Wt 219.0 lb

## 2021-02-26 DIAGNOSIS — R197 Diarrhea, unspecified: Secondary | ICD-10-CM | POA: Insufficient documentation

## 2021-02-26 DIAGNOSIS — R252 Cramp and spasm: Secondary | ICD-10-CM

## 2021-02-26 DIAGNOSIS — M25531 Pain in right wrist: Secondary | ICD-10-CM | POA: Diagnosis not present

## 2021-02-26 HISTORY — DX: Diarrhea, unspecified: R19.7

## 2021-02-26 HISTORY — DX: Cramp and spasm: R25.2

## 2021-02-26 LAB — FERRITIN: Ferritin: 55 ng/mL (ref 22.0–322.0)

## 2021-02-26 LAB — COMPREHENSIVE METABOLIC PANEL
ALT: 27 U/L (ref 0–53)
AST: 18 U/L (ref 0–37)
Albumin: 4.6 g/dL (ref 3.5–5.2)
Alkaline Phosphatase: 71 U/L (ref 39–117)
BUN: 17 mg/dL (ref 6–23)
CO2: 28 mEq/L (ref 19–32)
Calcium: 10.3 mg/dL (ref 8.4–10.5)
Chloride: 104 mEq/L (ref 96–112)
Creatinine, Ser: 1.03 mg/dL (ref 0.40–1.50)
GFR: 82.24 mL/min (ref >60.00)
Glucose, Bld: 170 mg/dL — ABNORMAL HIGH (ref 70–99)
Potassium: 4.7 mEq/L (ref 3.5–5.1)
Sodium: 140 mEq/L (ref 135–145)
Total Bilirubin: 0.3 mg/dL (ref 0.2–1.2)
Total Protein: 7.3 g/dL (ref 6.0–8.3)

## 2021-02-26 LAB — CBC
HCT: 43.6 % (ref 39.0–52.0)
Hemoglobin: 14 g/dL (ref 13.0–17.0)
MCHC: 32 g/dL (ref 30.0–36.0)
MCV: 84.7 fl (ref 78.0–100.0)
Platelets: 291 10*3/uL (ref 150.0–400.0)
RBC: 5.15 Mil/uL (ref 4.22–5.81)
RDW: 14.6 % (ref 11.5–15.5)
WBC: 5.2 10*3/uL (ref 4.0–10.5)

## 2021-02-26 NOTE — Assessment & Plan Note (Signed)
Follows with rheumatology and initially treated with steroids for gout. Etiology unclear with persistence of pain. He notes improvement with recent brace use - advised continued treatment and if not better to return to rheum or see Dr. Patsy Lager for re-evaluation and possible steroid injection

## 2021-02-26 NOTE — Progress Notes (Signed)
Subjective:     Kevin Huerta is a 55 y.o. male presenting for Wrist Pain (X 2 months ), Diarrhea (X 2 days ), and leg cramps (At night, even with taking magnesium )     HPI  #gouty arthritis - saw Dr. Allena Katz with rheumatology - was on steroids and symptoms were not getting better - symptoms have not improved after 2 rounds of steroids - wondering if this is not gout - feels like a tendon is too tight and difficulty bending the fingers - is considering cortisone  - tried a brace w/ improvement   # leg cramps - does not take magnesium daily - called on 4/14 w/ leg cramps and this was suggested - has been taking it last few days - taking with morning medication - hydration - 32 oz water x 2 most days - does not have a stretching routine - has used this for cramping -   #Diarrhea - last night had 3-4 BM overnight   Review of Systems  02/05/2021: Rheumatology - on prednisone, colchicine, and allupurinol for wrist pain  Social History   Tobacco Use  Smoking Status Former Smoker  . Packs/day: 1.50  . Years: 32.00  . Pack years: 48.00  . Types: Cigarettes  . Quit date: 06/11/2018  . Years since quitting: 2.7  Smokeless Tobacco Former Neurosurgeon  . Types: Snuff  . Quit date: 1996        Objective:    BP Readings from Last 3 Encounters:  02/26/21 110/60  10/15/20 120/70  09/03/20 118/62   Wt Readings from Last 3 Encounters:  02/26/21 219 lb (99.3 kg)  11/05/20 235 lb (106.6 kg)  10/15/20 235 lb 8 oz (106.8 kg)    BP 110/60   Pulse 88   Temp 98.2 F (36.8 C) (Temporal)   Ht 5\' 10"  (1.778 m)   Wt 219 lb (99.3 kg)   SpO2 95%   BMI 31.42 kg/m    Physical Exam Constitutional:      Appearance: Normal appearance. He is not ill-appearing or diaphoretic.  HENT:     Right Ear: External ear normal.     Left Ear: External ear normal.  Eyes:     General: No scleral icterus.    Extraocular Movements: Extraocular movements intact.     Conjunctiva/sclera:  Conjunctivae normal.  Cardiovascular:     Rate and Rhythm: Normal rate and regular rhythm.     Heart sounds: No murmur heard.   Pulmonary:     Effort: Pulmonary effort is normal. No respiratory distress.     Breath sounds: Normal breath sounds. No wheezing.  Abdominal:     General: Abdomen is flat. Bowel sounds are normal. There is no distension.     Palpations: Abdomen is soft.     Tenderness: There is no abdominal tenderness. There is no guarding or rebound.  Musculoskeletal:     Cervical back: Neck supple.     Comments: Right Wrist Inspection: no swelling, erythema Palpation: some ttp along the ulnar wrist and into the fifth metacarpal.  ROM: normal Strength: normal though some pain with resisted opposition with the 5th digit and thumb.  Legs w/o ttp or swelling  Skin:    General: Skin is warm and dry.  Neurological:     Mental Status: He is alert. Mental status is at baseline.  Psychiatric:        Mood and Affect: Mood normal.           Assessment &  Plan:   Problem List Items Addressed This Visit      Other   Leg cramps - Primary    Discussed importance of stretching regularly. Doing well with hydration. Will check labs w/ ferritin to evaluate for other etiology. Stop magnesium.       Relevant Orders   Comprehensive metabolic panel   CBC   Ferritin   Right wrist pain    Follows with rheumatology and initially treated with steroids for gout. Etiology unclear with persistence of pain. He notes improvement with recent brace use - advised continued treatment and if not better to return to rheum or see Dr. Patsy Lager for re-evaluation and possible steroid injection      Diarrhea    Suspect this may be 2/2 to magnesium use for leg cramps. Advised stopping. Hydration, fiber and if persisting to call or mychart next week.           Return if symptoms worsen or fail to improve.  Lynnda Child, MD  This visit occurred during the SARS-CoV-2 public health  emergency.  Safety protocols were in place, including screening questions prior to the visit, additional usage of staff PPE, and extensive cleaning of exam room while observing appropriate contact time as indicated for disinfecting solutions.

## 2021-02-26 NOTE — Assessment & Plan Note (Signed)
Suspect this may be 2/2 to magnesium use for leg cramps. Advised stopping. Hydration, fiber and if persisting to call or mychart next week.

## 2021-02-26 NOTE — Assessment & Plan Note (Signed)
Discussed importance of stretching regularly. Doing well with hydration. Will check labs w/ ferritin to evaluate for other etiology. Stop magnesium.

## 2021-02-26 NOTE — Patient Instructions (Signed)
Wrist pain - not sure the cause - continue wearing brace as it seems to help - Make appointment with Dr. Patsy Lager or Dr Allena Katz if not improving in 2 weeks  #leg cramps - hydration - regular stretching during the day and before bed - labs today - hold magnesium for diarrhea   #diarrhea - may be due to magnesium - if not better on Monday call

## 2021-03-04 ENCOUNTER — Encounter: Payer: Self-pay | Admitting: Family Medicine

## 2021-03-16 NOTE — Progress Notes (Signed)
Keeara Frees T. Lenn Volker, MD, CAQ Sports Medicine [facility] [address] [place], [address]  Phone: [phone]  FAX: [phone]  Kevin Huerta - 55 y.o. male  MRN [id number]  Date of Birth: 01/04/1966  Date: 03/17/2021  PCP: Lynnda Child, MD  Referral: Lynnda Child, MD  Chief Complaint  Patient presents with   Wrist Pain    Right x 3 months-No Injury    This visit occurred during the SARS-CoV-2 public health emergency.  Safety protocols were in place, including screening questions prior to the visit, additional usage of staff PPE, and extensive cleaning of exam room while observing appropriate contact time as indicated for disinfecting solutions.   Subjective:   Kevin Huerta is a 55 y.o. very pleasant male patient with Body mass index is 32.46 kg/m. who presents with the following:  The patient is seen in consultation today courtesy of Dr. Selena Batten for evaluation of R wrist pain.  He has been treated for gout by rheumatology, but he is here today to check-in about his current r wrist pain.  I do not see a synovial panel, but very clear documentation c/w gout.  3 months, felt like possibly a gout flare-up.  He did take some steroids.  He started to feel better after this, but he would not return fully to baseline. Took some colchicine. Did get better some, but he still is having some issus.  Pain never went away.  Having some isses using some pain at the ulnar distribition.  He is also lacking some motion at the wrist with flexion and extension.  He did wear what sounds like a cock-up wrist splint for the last 3 weeks.  He is not wearing that today in the office.  ? About a prior fracture, but no cast.  Question if this may have happened in high school.  Per Dr. Selena Batten: # leg cramps - does not take magnesium daily - called on 4/14 w/ leg cramps and this was suggested - has been taking it last few days - taking with morning  medication - hydration - 32 oz water x 2 most days - does not have a stretching routine - has used this for cramping   02/05/2021: Rheumatology - on prednisone, colchicine, and allupurinol for wrist pain  Review of Systems is noted in the HPI, as appropriate   Objective:   BP 100/60   Pulse 86   Temp 98.3 F (36.8 C) (Temporal)   Ht 5\' 10"  (1.778 m)   Wt 226 lb 4 oz (102.6 kg)   SpO2 96%   BMI 32.46 kg/m    EXTR: No clubbing/cyanosis/edema Normal gait  Hand: R Ecchymosis or edema: Wrist effusion dorsally ROM wrist/hand/digits/elbow: Global 35% loss of motion in all directions, most notable with wrist extension at terminal endpoints of motion all cause pain. Carpals, MCP's, digits: NT Distal Ulna and Radius: NT Supination lift test: neg Cysts/nodules: neg Finkelstein's test: neg Snuffbox tenderness: neg Scaphoid tubercle: NT Hook of Hamate: NT Resisted supination: NT Full composite fist Grip, all digits: 4/5 str on the right No tenosynovitis Axial load test: Pain Atrophy: neg  Hand sensation: intact   Radiology: No results found.  Assessment and Plan:     ICD-10-CM   1. Transient synovitis, right wrist  M67.331 triamcinolone acetonide (KENALOG-40) injection 20 mg    2. Acute pain of right wrist  M25.531     3. Chronic gouty arthropathy 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  M1A.00X0  Synovitis with loss of motion in the wrist with very good history for recurrent gouty arthropathy.  Acute on chronic pain.  While he does have known history of gout, it is unclear if this is causing his specific symptoms today, and less likely with some flexor tendon sheath pain as well.  Certainly does have a wrist effusion.  This case has been handled 100% correctly with textbook algorithms.  Since he has not done well, I am also going to do a wrist injection today.  Continue with wrist splint for the next 2 weeks and then I reviewed some basic finger rehab for the  patient.  Aspiration/Injection Procedure Note Kevin Huerta 03/23/1966 Date of procedure: 03/17/2021  Procedure: Medium Joint Aspiration / Injection of Wrist, R Indications: Pain  Procedure Details Patient verbally consented for procedure; risks, benefits, and alternatives explained.  Specifically, we did go over that there is a risk of some blanching of the skin dorsally . patient prepped with Chloraprep. Ethyl chloride used for anesthesia. Using sterile technique, just distal to Lister's tubercle, using 3 cc syringe with 22 gauge needle, needle inserted and aspirated, no blood. Then 1/2 cc Lidocaine 1% and Kenalog 20 mg injected without difficulty into wrist.  Pressue applied, minimal blood. Tolerated well, decreased pain, no complications. Medication: 1/2 cc of Kenalog 40 mg (equaling Kenalog 20 mg)   Meds ordered this encounter  Medications   triamcinolone acetonide (KENALOG-40) injection 20 mg   Medications Discontinued During This Encounter  Medication Reason   OZEMPIC, 1 MG/DOSE, 4 MG/3ML SOPN Change in therapy   Canagliflozin-metFORMIN HCl ER [phone] MG TB24 Change in therapy   Follow-up: If he continues to do poorly after a month, then I am happy to recheck him.  Formal hand therapy would also be very reasonable at that point.  Signed,  Elpidio Galea. Gladine Plude, MD   Outpatient Encounter Medications as of 03/17/2021  Medication Sig   allopurinol (ZYLOPRIM) 100 MG tablet Take 200 mg by mouth daily.   ALPRAZolam (XANAX) 0.5 MG tablet Take 0.5 mg by mouth as needed.   aspirin 81 MG tablet Take 81 mg by mouth daily.   atorvastatin (LIPITOR) 80 MG tablet Take 1 tablet (80 mg total) by mouth daily.   Blood Glucose Monitoring Suppl (ONETOUCH VERIO FLEX SYSTEM) w/Device KIT by Does not apply route.   buPROPion (WELLBUTRIN XL) 150 MG 24 hr tablet Take 150 mg by mouth daily.   cetirizine (ZYRTEC) 10 MG tablet Take 10 mg by mouth daily.   colchicine 0.6 MG tablet Take 0.6 mg by mouth  as needed.   Continuous Blood Gluc Sensor (FREESTYLE LIBRE 2 SENSOR) MISC Use to check glucose three times daily   glipiZIDE (GLUCOTROL XL) 10 MG 24 hr tablet Take 10 mg by mouth.   ketoconazole (NIZORAL) 2 % cream APPLY TOPICALLY TWICE A DAY AS NEEDED   lisinopril (ZESTRIL) 40 MG tablet Take 1 tablet (40 mg total) by mouth daily.   Melatonin 10 MG CAPS Take 10 mg by mouth every evening.   Multiple Vitamin (MULTI-VITAMIN) tablet Take by mouth.   omeprazole (PRILOSEC) 40 MG capsule Take 40 mg by mouth in the morning and at bedtime.   OneTouch Delica Lancets 33G MISC by Does not apply route.   ONETOUCH VERIO test strip    PROAIR HFA 108 (90 Base) MCG/ACT inhaler as needed.   Semaglutide, 2 MG/DOSE, (OZEMPIC, 2 MG/DOSE,) 8 MG/3ML SOPN Inject into the skin.   SYNJARDY XR 12.02-999 MG TB24 Take by  mouth.   traZODone (DESYREL) 50 MG tablet Take 50 mg by mouth at bedtime.   [DISCONTINUED] Canagliflozin-metFORMIN HCl ER [phone] MG TB24 Take 2 tablets by mouth daily.   [DISCONTINUED] OZEMPIC, 1 MG/DOSE, 4 MG/3ML SOPN Inject 1 mg into the skin once a week.   [EXPIRED] triamcinolone acetonide (KENALOG-40) injection 20 mg    No facility-administered encounter medications on file as of 03/17/2021.

## 2021-03-17 ENCOUNTER — Encounter: Payer: Self-pay | Admitting: Family Medicine

## 2021-03-17 ENCOUNTER — Ambulatory Visit: Payer: 59 | Admitting: Family Medicine

## 2021-03-17 ENCOUNTER — Other Ambulatory Visit: Payer: Self-pay

## 2021-03-17 VITALS — BP 100/60 | HR 86 | Temp 98.3°F | Ht 70.0 in | Wt 226.2 lb

## 2021-03-17 DIAGNOSIS — M1A00X0 Idiopathic chronic gout, unspecified site, without tophus (tophi): Secondary | ICD-10-CM | POA: Diagnosis not present

## 2021-03-17 DIAGNOSIS — M25531 Pain in right wrist: Secondary | ICD-10-CM

## 2021-03-17 DIAGNOSIS — M67331 Transient synovitis, right wrist: Secondary | ICD-10-CM | POA: Diagnosis not present

## 2021-03-17 MED ORDER — TRIAMCINOLONE ACETONIDE 40 MG/ML IJ SUSP
20.0000 mg | Freq: Once | INTRAMUSCULAR | Status: AC
Start: 2021-03-17 — End: 2021-03-17
  Administered 2021-03-17: 10:00:00 20 mg via INTRA_ARTICULAR

## 2021-03-24 ENCOUNTER — Telehealth: Payer: Self-pay | Admitting: Family Medicine

## 2021-03-24 DIAGNOSIS — M67331 Transient synovitis, right wrist: Secondary | ICD-10-CM

## 2021-03-24 DIAGNOSIS — M25531 Pain in right wrist: Secondary | ICD-10-CM

## 2021-03-24 NOTE — Telephone Encounter (Signed)
Placed referral to hand surgery.   Would recommend physical therapy if willing to go

## 2021-03-24 NOTE — Telephone Encounter (Signed)
Mrs. Molyneux called in wanted to know about getting her husband to see a hand speicalist, due to he is losing the strength in his right hand. And she wants him to see Dr. Merlyn Lot @ the hand specialist in [place].

## 2021-03-24 NOTE — Telephone Encounter (Signed)
Wanted to know if it can be expedited due to Dr. Patsy Lager did a injection and it didn't work he was still in pain

## 2021-03-24 NOTE — Addendum Note (Signed)
Addended by: Lynnda Child on: 03/24/2021 05:16 PM   Modules accepted: Orders

## 2021-03-25 ENCOUNTER — Telehealth: Payer: Self-pay | Admitting: Family Medicine

## 2021-03-25 NOTE — Telephone Encounter (Signed)
Spoke to pt's wife, DPR, and she says the hand specialist will not schedule pt an appt until they can review ov notes from here. I faxed last office notes from visit with Dr. Patsy Lager, to Dr. Merrilee Seashore office. Fax went through.

## 2021-03-25 NOTE — Telephone Encounter (Signed)
Mrs. Tolbert called in due to her husband was seen a few weeks ago for wrist pain, they gave him an injection but it didn't work and he is losing sleep and do his day to day functions. She called the hand doctor and she stated that they didn't need a referral they just need the doctors notes of what they done to him.   The doctor is Dr. Merlyn Lot at the hand specialist [phone]

## 2021-04-15 ENCOUNTER — Encounter: Payer: 59 | Admitting: Family Medicine

## 2021-04-22 ENCOUNTER — Other Ambulatory Visit: Payer: Self-pay

## 2021-04-22 ENCOUNTER — Encounter: Payer: Self-pay | Admitting: Family Medicine

## 2021-04-22 ENCOUNTER — Ambulatory Visit (INDEPENDENT_AMBULATORY_CARE_PROVIDER_SITE_OTHER): Payer: 59 | Admitting: Family Medicine

## 2021-04-22 VITALS — BP 136/78 | HR 97 | Temp 97.9°F | Ht 70.0 in | Wt 225.0 lb

## 2021-04-22 DIAGNOSIS — J4520 Mild intermittent asthma, uncomplicated: Secondary | ICD-10-CM | POA: Diagnosis not present

## 2021-04-22 DIAGNOSIS — Z125 Encounter for screening for malignant neoplasm of prostate: Secondary | ICD-10-CM | POA: Diagnosis not present

## 2021-04-22 DIAGNOSIS — E785 Hyperlipidemia, unspecified: Secondary | ICD-10-CM | POA: Diagnosis not present

## 2021-04-22 DIAGNOSIS — E1169 Type 2 diabetes mellitus with other specified complication: Secondary | ICD-10-CM

## 2021-04-22 DIAGNOSIS — Z0000 Encounter for general adult medical examination without abnormal findings: Secondary | ICD-10-CM | POA: Diagnosis not present

## 2021-04-22 MED ORDER — PROAIR HFA 108 (90 BASE) MCG/ACT IN AERS: 1.0000 | INHALATION_SPRAY | Freq: Four times a day (QID) | RESPIRATORY_TRACT | 1 refills | Status: DC | PRN

## 2021-04-22 NOTE — Patient Instructions (Signed)
Preventive Care 40-55 Years Old, Male Preventive care refers to lifestyle choices and visits with your health care provider that can promote health and wellness. This includes: A yearly physical exam. This is also called an annual wellness visit. Regular dental and eye exams. Immunizations. Screening for certain conditions. Healthy lifestyle choices, such as: Eating a healthy diet. Getting regular exercise. Not using drugs or products that contain nicotine and tobacco. Limiting alcohol use. What can I expect for my preventive care visit? Physical exam Your health care provider will check your: Height and weight. These may be used to calculate your BMI (body mass index). BMI is a measurement that tells if you are at a healthy weight. Heart rate and blood pressure. Body temperature. Skin for abnormal spots. Counseling Your health care provider may ask you questions about your: Past medical problems. Family's medical history. Alcohol, tobacco, and drug use. Emotional well-being. Home life and relationship well-being. Sexual activity. Diet, exercise, and sleep habits. Work and work environment. Access to firearms. What immunizations do I need?  Vaccines are usually given at various ages, according to a schedule. Your health care provider will recommend vaccines for you based on your age, medicalhistory, and lifestyle or other factors, such as travel or where you work. What tests do I need? Blood tests Lipid and cholesterol levels. These may be checked every 5 years, or more often if you are over 50 years old. Hepatitis C test. Hepatitis B test. Screening Lung cancer screening. You may have this screening every year starting at age 55 if you have a 30-pack-year history of smoking and currently smoke or have quit within the past 15 years. Prostate cancer screening. Recommendations will vary depending on your family history and other risks. Genital exam to check for testicular cancer  or hernias. Colorectal cancer screening. All adults should have this screening starting at age 50 and continuing until age 75. Your health care provider may recommend screening at age 45 if you are at increased risk. You will have tests every 1-10 years, depending on your results and the type of screening test. Diabetes screening. This is done by checking your blood sugar (glucose) after you have not eaten for a while (fasting). You may have this done every 1-3 years. STD (sexually transmitted disease) testing, if you are at risk. Follow these instructions at home: Eating and drinking  Eat a diet that includes fresh fruits and vegetables, whole grains, lean protein, and low-fat dairy products. Take vitamin and mineral supplements as recommended by your health care provider. Do not drink alcohol if your health care provider tells you not to drink. If you drink alcohol: Limit how much you have to 0-2 drinks a day. Be aware of how much alcohol is in your drink. In the [place] one drink equals one 12 oz bottle of beer (355 mL), one 5 oz glass of wine (148 mL), or one 1 oz glass of hard liquor (44 mL).  Lifestyle Take daily care of your teeth and gums. Brush your teeth every morning and night with fluoride toothpaste. Floss one time each day. Stay active. Exercise for at least 30 minutes 5 or more days each week. Do not use any products that contain nicotine or tobacco, such as cigarettes, e-cigarettes, and chewing tobacco. If you need help quitting, ask your health care provider. Do not use drugs. If you are sexually active, practice safe sex. Use a condom or other form of protection to prevent STIs (sexually transmitted infections). If told by   your health care provider, take low-dose aspirin daily starting at age 50. Find healthy ways to cope with stress, such as: Meditation, yoga, or listening to music. Journaling. Talking to a trusted person. Spending time with friends and  family. Safety Always wear your seat belt while driving or riding in a vehicle. Do not drive: If you have been drinking alcohol. Do not ride with someone who has been drinking. When you are tired or distracted. While texting. Wear a helmet and other protective equipment during sports activities. If you have firearms in your house, make sure you follow all gun safety procedures. What's next? Go to your health care provider once a year for an annual wellness visit. Ask your health care provider how often you should have your eyes and teeth checked. Stay up to date on all vaccines. This information is not intended to replace advice given to you by your health care provider. Make sure you discuss any questions you have with your healthcare provider. Document Revised: 06/20/2019 Document Reviewed: 09/15/2018 Elsevier Patient Education  2022 Elsevier Inc.  

## 2021-04-22 NOTE — Progress Notes (Signed)
Annual Exam   Chief Complaint:  Chief Complaint  Patient presents with   Annual Exam    History of Present Illness:  Kevin Huerta is a 55 y.o. presents today for annual examination.     Nutrition/Lifestyle Diet: appetite lower on ozempic, not eating as much as he used to. Tries to balance the diet. Could do better at diabetic diet Exercise: recently started more exercise, 2-3 times a week He is single partner, contraception - none.  Any issues with getting or keeping erection? No  Social History   Tobacco Use  Smoking Status Former   Packs/day: 1.50   Years: 32.00   Pack years: 48.00   Types: Cigarettes   Quit date: 06/11/2018   Years since quitting: 2.8  Smokeless Tobacco Former   Types: Snuff   Quit date: 39   Social History   Substance and Sexual Activity  Alcohol Use Yes   Comment: cut back, 6 pack per month   Social History   Substance and Sexual Activity  Drug Use Yes   Types: Marijuana   Comment: a few times a year     Safety The patient wears seatbelts: yes.     The patient feels safe at home and in their relationships: yes.  General Health Dentist in the last year: Yes Eye doctor: yes  Weight Wt Readings from Last 3 Encounters:  04/22/21 225 lb (102.1 kg)  03/17/21 226 lb 4 oz (102.6 kg)  02/26/21 219 lb (99.3 kg)   Patient has very high BMI  BMI Readings from Last 1 Encounters:  04/22/21 32.28 kg/m     Chronic disease screening Blood pressure monitoring:  BP Readings from Last 3 Encounters:  04/22/21 136/78  03/17/21 100/60  02/26/21 110/60    Lipid Monitoring: Indication for screening: age >35, obesity, diabetes, family hx, CV risk factors.  Lipid screening: Yes  Lab Results  Component Value Date   CHOL 173 09/03/2020   HDL 41.50 09/03/2020   LDLCALC 106 (H) 09/03/2020   TRIG 127.0 09/03/2020   CHOLHDL 4 09/03/2020     Diabetes Screening: age >40, overweight, family hx, PCOS, hx of gestational diabetes, at risk  ethnicity, elevated blood pressure >135/80.  Diabetes Screening screening: pt with diabetes  No results found for: HGBA1C   Prostate Cancer Screening: Yes Age 44-69 yo Shared Decision Making Higher Risk: Older age, African American, Family Hx of Prostate Cancer - Yes Benefits: screening may prevent 1.3 deaths from prostate cancer over 13 years per 1000 men screened and prevent 3 metastatic cases per 1000 men screened. Not enough evidence to support more benefit for AA or FMH Harms: False Positive and psychological harms. 15% of me with false positive over a 2 to 4 year period > resulting in biopsy and complications such as pain, hematospermia, infections. Overdiagnosis - increases with age - found that 20-50% of prostate cancer through screening may have never caused any issues. Harms of treatment include - erectile dysfunction, urinary incontinence, and bothersome bowel symptoms.   After discussion he does want to get a PSA checked today.   Inadequate evidence for screening <55 No mortality benefit for screening >70   Lab Results  Component Value Date   PSA 0.43 05/24/2020       Colon Cancer Screening:  Age 38-75 yo - benefits outweigh the risk. Adults 53-85 yo who have never been screened benefit.  Benefits: 134000 people in 2016 will be diagnosed and 49,000 will die - early detection helps Harms: Complications  2/2 to colonoscopy High Risk (Colonoscopy): genetic disorder (Lynch syndrome or familial adenomatous polyposis), personal hx of IBD, previous adenomatous polyp, or previous colorectal cancer, FamHx start 10 years before the age at diagnosis, increased in males and black race  Options:  FIT - looks for hemoglobin (blood in the stool) - specific and fairly sensitive - must be done annually Cologuard - looks for DNA and blood - more sensitive - therefore can have more false positives, every 3 years Colonoscopy - every 10 years if normal - sedation, bowl prep, must have someone  drive you  Shared decision making and the patient had decided to do 2017 colonoscopy due 2027.   Social History   Tobacco Use  Smoking Status Former   Packs/day: 1.50   Years: 32.00   Pack years: 48.00   Types: Cigarettes   Quit date: 06/11/2018   Years since quitting: 2.8  Smokeless Tobacco Former   Types: Snuff   Quit date: 1996    Lung Cancer Screening (Ages 40-80): yes 20 year pack history? Yes Current Tobacco user? No Quit less than 15 years ago? Yes Interested in low dose CT for lung cancer screening? yes  Abdominal Aortic Aneurysm:  Age 60-75, 1 time screening, men who have ever smoked not old    Past Medical History:  Diagnosis Date   Asthma    Diabetes mellitus without complication (HCC)    Fatty liver 09/03/2020   Heart murmur    Hyperlipidemia    Hypertension     Past Surgical History:  Procedure Laterality Date   CERVICAL DISC SURGERY     fusion   KNEE SURGERY Right    ROTATOR CUFF REPAIR     WISDOM TOOTH EXTRACTION      Prior to Admission medications   Medication Sig Start Date End Date Taking? Authorizing Provider  allopurinol (ZYLOPRIM) 100 MG tablet Take 200 mg by mouth daily. 02/21/18  Yes [provider]  ALPRAZolam Prudy Feeler) 0.5 MG tablet Take 0.5 mg by mouth as needed. 10/17/18  Yes [provider]  atorvastatin (LIPITOR) 80 MG tablet Take 1 tablet (80 mg total) by mouth daily. 10/15/20  Yes Lynnda Child, MD  Blood Glucose Monitoring Suppl (ONETOUCH VERIO FLEX SYSTEM) w/Device KIT by Does not apply route. 06/04/17  Yes [provider]  buPROPion (WELLBUTRIN XL) 150 MG 24 hr tablet Take 150 mg by mouth daily. 04/09/20  Yes [provider]  cetirizine (ZYRTEC) 10 MG tablet Take 10 mg by mouth daily.   Yes [provider]  colchicine 0.6 MG tablet Take 0.6 mg by mouth as needed. 06/20/19  Yes [provider]  Continuous Blood Gluc Sensor (FREESTYLE LIBRE 2 SENSOR) MISC Use to check glucose three  times daily 10/15/20  Yes Lynnda Child, MD  glipiZIDE (GLUCOTROL XL) 10 MG 24 hr tablet Take 10 mg by mouth. 03/17/18  Yes [provider]  ketoconazole (NIZORAL) 2 % cream APPLY TOPICALLY TWICE A DAY AS NEEDED 04/26/19  Yes [provider]  lisinopril (ZESTRIL) 40 MG tablet Take 1 tablet (40 mg total) by mouth daily. 01/16/21  Yes Lynnda Child, MD  Melatonin 10 MG CAPS Take 10 mg by mouth every evening.   Yes [provider]  Multiple Vitamin (MULTI-VITAMIN) tablet Take by mouth.   Yes [provider]  omeprazole (PRILOSEC) 40 MG capsule Take 40 mg by mouth in the morning and at bedtime. 04/27/19  Yes [provider]  OneTouch Delica Lancets 33G  MISC by Does not apply route. 06/04/17  Yes [provider]  ONETOUCH VERIO test strip  08/15/18  Yes [provider]  PROAIR HFA 108 (90 Base) MCG/ACT inhaler as needed. 10/03/18  Yes [provider]  Semaglutide, 2 MG/DOSE, (OZEMPIC, 2 MG/DOSE,) 8 MG/3ML SOPN Inject into the skin. 03/04/21  Yes [provider]  SYNJARDY XR 12.02-999 MG TB24 Take by mouth. 03/05/21  Yes [provider]  traZODone (DESYREL) 100 MG tablet Take 100 mg by mouth at bedtime. 03/23/21  Yes [provider]    No Known Allergies   Social History   Socioeconomic History   Marital status: Married    Spouse name: leslie   Number of children: 3   Years of education: Not on file   Highest education level: Not on file  Occupational History   Occupation: IT   Tobacco Use   Smoking status: Former    Packs/day: 1.50    Years: 32.00    Pack years: 48.00    Types: Cigarettes    Quit date: 06/11/2018    Years since quitting: 2.8   Smokeless tobacco: Former    Types: Snuff    Quit date: 1996  Vaping Use   Vaping Use: Some days   Devices: x3 per day to quit smoking nicotine   Substance and Sexual Activity   Alcohol use: Yes    Comment: cut back, 6 pack per month   Drug use:  Yes    Types: Marijuana    Comment: a few times a year   Sexual activity: Yes    Partners: Female    Birth control/protection: None  Other Topics Concern   Not on file  Social History Narrative   10/15/20   From: [place] originally, but in the area since 2000   Living: with wife, Verlon Au (2003) and granddaughter   Work: Scientist, 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[employer]      Family: 3 daughters - Irish Lack, Toni Amend (just finished law school), [place], and granddaughter McKenzie (2011)      Enjoys: travel when able, beach, cycling      Exercise: exercise bike at home   Diet: does not follow diabetic diet, lower appetite with ozempic      Safety   Seat belts: Yes    Guns: Yes  and secure   Safe in relationships: Yes    Social Determinants of Corporate investment banker Strain: Low Risk    Difficulty of Paying Living Expenses: Not hard at all  Food Insecurity: Not on file  Transportation Needs: Not on file  Physical Activity: Not on file  Stress: Not on file  Social Connections: Not on file  Intimate Partner Violence: Not on file    Family History  Problem Relation Age of Onset   Heart attack Father 62   Alcohol abuse Father    Drug abuse Father    Heart disease Father    Lung cancer Father    Hypertension Mother    Heart attack Maternal Grandmother 5   Stroke Maternal Aunt 65    Review of Systems  Constitutional:  Negative for chills and fever.  HENT:  Negative for congestion and sore throat.   Eyes:  Negative for blurred vision and double vision.  Respiratory:  Negative for shortness of breath.   Cardiovascular:  Negative for chest pain.  Gastrointestinal:  Negative for heartburn, nausea and vomiting.  Genitourinary: Negative.   Musculoskeletal: Negative.  Negative for myalgias.  Skin:  Negative  for rash.  Neurological:  Negative for dizziness and headaches.  Endo/Heme/Allergies:  Does not bruise/bleed easily.  Psychiatric/Behavioral:  Negative for depression. The patient is  not nervous/anxious.     Physical Exam BP 136/78   Pulse 97   Temp 97.9 F (36.6 C) (Temporal)   Ht 5\' 10"  (1.778 m)   Wt 225 lb (102.1 kg)   SpO2 98%   BMI 32.28 kg/m    BP Readings from Last 3 Encounters:  04/22/21 136/78  03/17/21 100/60  02/26/21 110/60      Physical Exam Constitutional:      General: He is not in acute distress.    Appearance: He is well-developed. He is not diaphoretic.  HENT:     Head: Normocephalic and atraumatic.     Right Ear: Tympanic membrane and ear canal normal.     Left Ear: Tympanic membrane and ear canal normal.     Nose: Nose normal.     Mouth/Throat:     Pharynx: Uvula midline.  Eyes:     General: No scleral icterus.    Conjunctiva/sclera: Conjunctivae normal.     Pupils: Pupils are equal, round, and reactive to light.  Cardiovascular:     Rate and Rhythm: Normal rate and regular rhythm.     Heart sounds: Normal heart sounds. No murmur heard. Pulmonary:     Effort: Pulmonary effort is normal. No respiratory distress.     Breath sounds: Normal breath sounds. No wheezing.  Abdominal:     General: Bowel sounds are normal. There is no distension.     Palpations: Abdomen is soft. There is no mass.     Tenderness: There is no abdominal tenderness. There is no guarding.  Musculoskeletal:        General: Normal range of motion.     Cervical back: Normal range of motion and neck supple.  Lymphadenopathy:     Cervical: No cervical adenopathy.  Skin:    General: Skin is warm and dry.     Capillary Refill: Capillary refill takes less than 2 seconds.  Neurological:     Mental Status: He is alert and oriented to person, place, and time.       Results:  PHQ-9:  Flowsheet Row Office Visit from 10/15/2020 in [facility]  PHQ-9 Total Score 9         Assessment: 55 y.o. here for routine annual physical examination.  Plan: Problem List Items Addressed This Visit       Respiratory   Asthma   Relevant  Medications   PROAIR HFA 108 (90 Base) MCG/ACT inhaler     Endocrine   Hyperlipidemia associated with type 2 diabetes mellitus (HCC)   Relevant Orders   Lipid panel   Other Visit Diagnoses     Annual physical exam    -  Primary   Screening for prostate cancer       Relevant Orders   PSA       Screening: -- Blood pressure screen normal -- cholesterol screening: not due for screening -- Weight screening: obese: discussed management options, including lifestyle, dietary, and exercise. -- Diabetes Screening:  sees endocrine -- Nutrition: normal - Encouraged healthy diet  The 10-year ASCVD risk score Denman George DC Jr., et al., 2013) is: 21.4%   Values used to calculate the score:     Age: 57 years     Sex: Male     Is Non-Hispanic African American: Yes     Diabetic:  Yes     Tobacco smoker: No     Systolic Blood Pressure: 136 mmHg     Is BP treated: Yes     HDL Cholesterol: 41.5 mg/dL     Total Cholesterol: 173 mg/dL  -- ASA 81 mg discussed if CVD risk >10% age 72-59 and willing to take for 10 years -- Statin therapy for Age 98-75 with CVD risk >7.5%  Psych -- Depression screening (PHQ-9): negative  Safety -- tobacco screening: not using -- alcohol screening:  low-risk usage. -- no evidence of domestic violence or intimate partner violence.  Cancer Screening -- Prostate (age 59-69) ordered -- Colon (age 2-75)  up to date -- Lung  up to date    Immunizations Immunization History  Administered Date(s) Administered   Hepatitis A 06/17/1967   Hepatitis B Feb 05, 1966   Influenza, Seasonal, Injecte, Preservative Fre 07/22/2012, 07/20/2015, 06/22/2016, 08/23/2018   Influenza,inj,Quad PF,6+ Mos 05/24/2020   Influenza-Unspecified 07/22/2012   MMR 06/17/1967   Moderna Sars-Covid-2 Vaccination 11/27/2019, 01/02/2020, 08/21/2020   Pneumococcal Polysaccharide-23 08/25/2012   Pneumococcal-Unspecified 09/25/2003, 08/25/2012   Td 06/29/2006   Tdap 06/29/2006, 12/23/2016    Varicella 06/17/1979   Zoster, Live 06/16/2016    -- flu vaccine up to date -- TDAP q10 years up to date -- Shingles (age >50) not up to date - pt will call back for nurse visit -- PPSV-23 (19-64 with chronic disease or smoking) up to date -- PCV-13 (age >61) - one dose followed by PPSV-23 1 year later up to date -- Covid-19 Vaccine up to date  Encouraged regular vision and dental screening. Encouraged healthy exercise and diet.   Lynnda Child

## 2021-04-23 LAB — LIPID PANEL
Cholesterol: 136 mg/dL (ref 0–200)
HDL: 42.1 mg/dL (ref >39.00)
LDL Cholesterol: 73 mg/dL (ref 0–99)
NonHDL: 93.98
Total CHOL/HDL Ratio: 3
Triglycerides: 106 mg/dL (ref 0.0–149.0)
VLDL: 21.2 mg/dL (ref 0.0–40.0)

## 2021-04-23 LAB — PSA: PSA: 0.51 ng/mL (ref 0.10–4.00)

## 2021-06-02 ENCOUNTER — Encounter: Payer: Self-pay | Admitting: Podiatry

## 2021-06-02 ENCOUNTER — Other Ambulatory Visit: Payer: Self-pay

## 2021-06-02 ENCOUNTER — Ambulatory Visit: Payer: 59 | Admitting: Podiatry

## 2021-06-02 DIAGNOSIS — M79675 Pain in left toe(s): Secondary | ICD-10-CM | POA: Diagnosis not present

## 2021-06-02 DIAGNOSIS — L309 Dermatitis, unspecified: Secondary | ICD-10-CM

## 2021-06-02 DIAGNOSIS — E119 Type 2 diabetes mellitus without complications: Secondary | ICD-10-CM | POA: Diagnosis not present

## 2021-06-02 DIAGNOSIS — B351 Tinea unguium: Secondary | ICD-10-CM

## 2021-06-02 DIAGNOSIS — M79674 Pain in right toe(s): Secondary | ICD-10-CM

## 2021-06-02 HISTORY — DX: Dermatitis, unspecified: L30.9

## 2021-06-02 MED ORDER — CLOTRIMAZOLE-BETAMETHASONE 1-0.05 % EX CREA: 1.0000 "application " | TOPICAL_CREAM | Freq: Every day | CUTANEOUS | 0 refills | Status: DC

## 2021-06-02 NOTE — Progress Notes (Signed)
This patient returns to my office for at risk foot care.  This patient requires this care by a professional since this patient will be at risk due to having diabetes.  This patient is unable to cut nails himself since the patient cannot reach his nails.These nails are painful walking and wearing shoes.  States he is having skin lesions the last 3 weeks. This patient presents for at risk foot care today.  General Appearance  Alert, conversant and in no acute stress.  Vascular  Dorsalis pedis and posterior tibial  pulses are palpable  bilaterally.  Capillary return is within normal limits  bilaterally. Temperature is within normal limits  bilaterally.  Neurologic  Senn-Weinstein monofilament wire test within normal limits  bilaterally. Muscle power within normal limits bilaterally.  Nails Thick disfigured discolored nails with subungual debris  from hallux to fifth toes bilaterally. No evidence of bacterial infection or drainage bilaterally.  Orthopedic  No limitations of motion  feet .  No crepitus or effusions noted.  No digital deformities noted.  HAV  B/L.  Skin  normotropic skin with no porokeratosis noted bilaterally.  No signs of infections or ulcers noted.   Skin lesions noted ankles  B/L with no evidence of inflammation.  Onychomycosis  Pain in right toes  Pain in left toes  Consent was obtained for treatment procedures.   Mechanical debridement of nails 1-5  bilaterally performed with a nail nipper.  Filed with dremel without incident.    Return office visit   4 months                   Told patient to return for periodic foot care and evaluation due to potential at risk complications.  Prescribe lotroisone cream.   Helane Gunther DPM

## 2021-06-10 ENCOUNTER — Ambulatory Visit
Admission: EM | Admit: 2021-06-10 | Discharge: 2021-06-10 | Disposition: A | Discharge status: Home / Self Care | Payer: 59 | Attending: Emergency Medicine | Admitting: Emergency Medicine

## 2021-06-10 ENCOUNTER — Other Ambulatory Visit: Payer: Self-pay

## 2021-06-10 ENCOUNTER — Encounter: Payer: Self-pay | Admitting: Emergency Medicine

## 2021-06-10 DIAGNOSIS — B349 Viral infection, unspecified: Secondary | ICD-10-CM

## 2021-06-10 DIAGNOSIS — J029 Acute pharyngitis, unspecified: Secondary | ICD-10-CM | POA: Diagnosis not present

## 2021-06-10 LAB — POCT RAPID STREP A (OFFICE): Rapid Strep A Screen: NEGATIVE

## 2021-06-10 NOTE — ED Triage Notes (Signed)
Pt here with sore throat starting today.

## 2021-06-10 NOTE — ED Provider Notes (Signed)
Kevin Huerta    CSN: [id number] Arrival date & time: 06/10/21  1328      History   Chief Complaint Chief Complaint  Patient presents with   Sore Throat    HPI Kevin Huerta is a 55 y.o. male.  Patient presents with sore throat, postnasal drip, congestion since this morning.  He denies fever, chills, rash, cough, shortness of breath, or other symptoms.  Treatment at home with ibuprofen.  His medical history includes hypertension, asthma, and diabetes.  The history is provided by the patient and medical records.   Past Medical History:  Diagnosis Date   Asthma    Diabetes mellitus without complication ([place])    Fatty liver 09/03/2020   Heart murmur    Hyperlipidemia    Hypertension     Patient Active Problem List   Diagnosis Date Noted   Dermatitis 06/02/2021   Leg cramps 02/26/2021   Right wrist pain 02/26/2021   Diarrhea 02/26/2021   Pain due to onychomycosis of toenails of both feet 01/30/2021   Atherosclerosis of aorta ([place]) 11/07/2020   Coronary artery disease 11/07/2020   Emphysema lung ([place]) 11/07/2020   History of tobacco use 10/15/2020   Hyperlipidemia associated with type 2 diabetes mellitus ([place]) 10/15/2020   Insomnia 10/15/2020   Fatty liver 09/03/2020   BRBPR (bright red blood per rectum) 05/24/2020   Left wrist pain 01/29/2020   Chronic arthropathy 01/29/2020   Chronic gouty arthropathy 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 11/24/2017   Hypersomnia 07/17/2014   OSA (obstructive sleep apnea) 07/17/2014   Snoring 07/17/2014   Cervical radiculopathy 01/22/2011   Hypertension associated with diabetes ([place]) 01/22/2011   Type 2 diabetes mellitus without complication, without long-term current use of insulin ([place]) 01/22/2011   Eczema 04/15/2010   Hyperlipidemia LDL goal <100 03/13/2010   Generalized anxiety disorder 12/17/2009   Allergic rhinitis 07/30/2009   Asthma 07/29/2009   Depression 03/31/2007   Gout 03/31/2007   Herpes simplex 03/31/2007    Past Surgical  History:  Procedure Laterality Date   CERVICAL DISC SURGERY     fusion   KNEE SURGERY Right    ROTATOR CUFF REPAIR     WISDOM TOOTH EXTRACTION         Home Medications    Prior to Admission medications   Medication Sig Start Date End Date Taking? Authorizing Provider  allopurinol (ZYLOPRIM) 100 MG tablet Take 200 mg by mouth daily. 02/21/18   [provider]  ALPRAZolam Duanne Moron) 0.5 MG tablet Take 0.5 mg by mouth as needed. 10/17/18   [provider]  atorvastatin (LIPITOR) 80 MG tablet Take 1 tablet (80 mg total) by mouth daily. 10/15/20   Lesleigh Noe, MD  Blood Glucose Monitoring Suppl ([place]) w/Device KIT by Does not apply route. 06/04/17   [provider]  buPROPion (WELLBUTRIN XL) 150 MG 24 hr tablet Take 150 mg by mouth daily. 04/09/20   [provider]  cetirizine (ZYRTEC) 10 MG tablet Take 10 mg by mouth daily.    [provider]  clotrimazole-betamethasone (LOTRISONE) cream Apply 1 application topically daily. 06/02/21   Gardiner Barefoot, DPM  colchicine 0.6 MG tablet Take 0.6 mg by mouth as needed. 06/20/19   [provider]  Continuous Blood Gluc Sensor (FREESTYLE LIBRE 2 SENSOR) MISC Use to check glucose three times daily 10/15/20   Lesleigh Noe, MD  glipiZIDE (GLUCOTROL XL) 10 MG 24 hr tablet Take 10 mg by mouth. 03/17/18   [provider]  ketoconazole (NIZORAL) 2 % cream APPLY TOPICALLY TWICE A DAY AS NEEDED 04/26/19   [provider]  lisinopril (ZESTRIL) 40 MG tablet Take 1 tablet (40 mg total) by mouth daily. 01/16/21   Lesleigh Noe, MD  Melatonin 10 MG CAPS Take 10 mg by mouth every evening.    [provider]  Multiple Vitamin (MULTI-VITAMIN) tablet Take by mouth.    [provider]  omeprazole (PRILOSEC) 40 MG capsule Take 40 mg by mouth in the morning and at bedtime. 04/27/19   [provider]  OneTouch Delica Lancets 60O MISC by Does not apply route.  06/04/17   [provider]  ONETOUCH VERIO test strip  08/15/18   [provider]  PROAIR HFA 108 (90 Base) MCG/ACT inhaler Inhale 1-2 puffs into the lungs every 6 (six) hours as needed for wheezing or shortness of breath. 04/22/21   Lesleigh Noe, MD  Semaglutide, 2 MG/DOSE, (OZEMPIC, 2 MG/DOSE,) 8 MG/3ML SOPN Inject into the skin. 03/04/21   [provider]  SYNJARDY XR 12.02-999 MG TB24 Take by mouth. 03/05/21   [provider]  traZODone (DESYREL) 100 MG tablet Take 100 mg by mouth at bedtime. 03/23/21   [provider]    Family History Family History  Problem Relation Age of Onset   Heart attack Father 20   Alcohol abuse Father    Drug abuse Father    Heart disease Father    Lung cancer Father    Hypertension Mother    Heart attack Maternal Grandmother 67   Stroke Maternal Aunt 65    Social History Social History   Tobacco Use   Smoking status: Former    Packs/day: 1.50    Years: 32.00    Pack years: 48.00    Types: Cigarettes    Quit date: 06/11/2018    Years since quitting: 3.0   Smokeless tobacco: Former    Types: Snuff    Quit date: 1996  Vaping Use   Vaping Use: Some days   Devices: x3 per day to quit smoking nicotine   Substance Use Topics   Alcohol use: Yes    Comment: cut back, 6 pack per month   Drug use: Yes    Types: Marijuana    Comment: a few times a year     Allergies   Patient has no known allergies.   Review of Systems Review of Systems  Constitutional:  Negative for chills and fever.  HENT:  Positive for congestion, postnasal drip and sore throat. Negative for ear pain.   Respiratory:  Negative for cough and shortness of breath.   Cardiovascular:  Negative for chest pain and palpitations.  Gastrointestinal:  Negative for abdominal pain and vomiting.  Skin:  Negative for color change and rash.  All other systems reviewed and are negative.   Physical Exam Triage Vital Signs ED Triage Vitals   Enc Vitals Group     BP      Pulse      Resp      Temp      Temp src      SpO2      Weight      Height      Head Circumference      Peak Flow      Pain Score      Pain Loc      Pain Edu?      Excl. in [place]?    No data found.  Updated Vital Signs BP 115/77   Pulse 85   Temp 98.1 F (36.7 C)   Resp 20   SpO2 96%   Visual Acuity Right Eye Distance:   Left Eye Distance:   Bilateral Distance:    Right Eye Near:   Left Eye Near:    Bilateral Near:     Physical Exam Vitals and nursing note reviewed.  Constitutional:      General: He is not in acute distress.    Appearance: He is well-developed. He is not ill-appearing.  HENT:     Head: Normocephalic and atraumatic.     Right Ear: Tympanic membrane normal.     Left Ear: Tympanic membrane normal.     Nose: Nose normal.     Mouth/Throat:     Mouth: Mucous membranes are moist.     Pharynx: Oropharynx is clear.     Comments: Clear postnasal drip. Eyes:     Conjunctiva/sclera: Conjunctivae normal.  Cardiovascular:     Rate and Rhythm: Normal rate and regular rhythm.     Heart sounds: Normal heart sounds.  Pulmonary:     Effort: Pulmonary effort is normal. No respiratory distress.     Breath sounds: Normal breath sounds.  Abdominal:     Palpations: Abdomen is soft.     Tenderness: There is no abdominal tenderness.  Musculoskeletal:     Cervical back: Neck supple.  Skin:    General: Skin is warm and dry.  Neurological:     General: No focal deficit present.     Mental Status: He is alert and oriented to person, place, and time.     Gait: Gait normal.  Psychiatric:        Mood and Affect: Mood normal.        Behavior: Behavior normal.     UC Treatments / Results  Labs (all labs ordered are listed, but only abnormal results are displayed) Labs Reviewed  POCT RAPID STREP A (OFFICE) - Normal  NOVEL CORONAVIRUS, NAA    EKG   Radiology No results found.  Procedures Procedures (including critical  care time)  Medications Ordered in UC Medications - No data to display  Initial Impression / Assessment and Plan / UC Course  I have reviewed the triage vital signs and the nursing notes.  Pertinent labs & imaging results that were available during my care of the patient were reviewed by me and considered in my medical decision making (see chart for details).   Sore throat, Viral illness.  Rapid strep negative; culture pending. COVID pending.  Instructed patient to self quarantine per CDC guidelines.  Discussed symptomatic treatment including Tylenol or ibuprofen, rest, hydration.  Instructed patient to follow up with PCP if symptoms are not improving.  Patient agrees to plan of care.    Final Clinical Impressions(s) / UC Diagnoses   Final diagnoses:  Sore throat  Viral illness     Discharge Instructions      Your rapid strep test is negative.  A throat culture is pending; we will call you if it is positive requiring treatment.    Your COVID test is pending.  You should self quarantine until the test result is back.    Take Tylenol or ibuprofen as needed for fever or discomfort.  Rest and keep yourself hydrated.    Follow-up with your primary care provider if your symptoms are not improving.         ED Prescriptions   None  PDMP not reviewed this encounter.   Sharion Balloon, NP 06/10/21 1407

## 2021-06-10 NOTE — Discharge Instructions (Addendum)
Your rapid strep test is negative.  A throat culture is pending; we will call you if it is positive requiring treatment.    Your COVID test is pending.  You should self quarantine until the test result is back.    Take Tylenol or ibuprofen as needed for fever or discomfort.  Rest and keep yourself hydrated.    Follow-up with your primary care provider if your symptoms are not improving.     

## 2021-06-11 LAB — NOVEL CORONAVIRUS, NAA: SARS-CoV-2, NAA: NOT DETECTED

## 2021-06-11 LAB — SARS-COV-2, NAA 2 DAY TAT

## 2021-06-12 ENCOUNTER — Telehealth: Payer: Self-pay

## 2021-06-12 ENCOUNTER — Ambulatory Visit: Payer: 59

## 2021-06-12 NOTE — Telephone Encounter (Signed)
End of access nurse note pt complies with advice from access nurse and will go to UC. Sending note to DR Selena Batten who is out of office, Allayne Gitelman NP who is in office and [place] CMA. Will teams Joellen.

## 2021-06-12 NOTE — Telephone Encounter (Signed)
[facility] - Client TELEPHONE ADVICE RECORD [facility] Patient Name: Kevin Huerta Gender: Male DOB: [birth year] Age: 55 Y 11 M 27 D Return Phone Number: [phone] (Primary), [phone] (Secondary) Address: City/ State/ Zip: [place] [address] Client [facility] - Client Client Site [facility] - Day Physician Gweneth Dimitri- MD Contact Type Call Who Is Calling Patient / Member / Family / Caregiver Call Type Triage / Clinical Caller Name Selim Toruno Relationship To Patient Spouse Return Phone Number [phone] (Primary) Chief Complaint CHEST PAIN - pain, pressure, heaviness or tightness Reason for Call Symptomatic / Request for Health Information Initial Comment Caller states her husband has fatigue, congestion, cough and is wheezing with chest tightness.He is testing Covid negative. Translation No Nurse Assessment Nurse: Daphine Deutscher, RN, Melanie Date/Time (Eastern Time): 06/12/2021 3:21:08 PM Confirm and document reason for call. If symptomatic, describe symptoms. ---Was not able to connect with pt to get permission to speak to his wife on his behalf. Pt was in a staff meeting and could not be reached. Wife said she would call later . Does the patient have any new or worsening symptoms? ---No Nurse: Daphine Deutscher, RN, Melanie Date/Time (Eastern Time): 06/12/2021 3:56:36 PM Confirm and document reason for call. If symptomatic, describe symptoms. ---Caller states her H has fatigue and congestion and he has chest tightness and uses his inhaler and this seems to help. Went to UC on Tuesday and also repeated an at home test. Cough is what is bothering him now. Dimetapp, sudafed and mucinex for the chest . complains of how tired he is every day. Symptoms started a few weeks ago. Started using inhaler a week ago. Does the patient have any new or worsening symptoms? ---Yes Will a triage be  completed? ---Yes Related visit to physician within the last 2 weeks? ---Yes Does the PT have any chronic conditions? (i.e. diabetes, asthma, this includes High risk factors for pregnancy, etc.) ---Yes List chronic conditions. ---diabetes PLEASE NOTE: All timestamps contained within this report are represented as [place] Standard Time. CONFIDENTIALTY NOTICE: This fax transmission is intended only for the addressee. It contains information that is legally privileged, confidential or otherwise protected from use or disclosure. If you are not the intended recipient, you are strictly prohibited from reviewing, disclosing, copying using or disseminating any of this information or taking any action in reliance on or regarding this information. If you have received this fax in error, please notify us immediately by telephone so that we can arrange for its return to [place]. Phone: [phone], Toll-Free: [phone], Fax: [phone] Page: 2 of 2 Call Id: [id number] Nurse Assessment Is this a behavioral health or substance abuse call? ---No Guidelines Guideline Title Affirmed Question Affirmed Notes Nurse Date/Time Lamount Cohen Time) COVID-19 - Diagnosed or Suspected Chest pain or pressure (Exception: MILD central chest pain, present only when coughing) Daphine Deutscher, RN, Melanie 06/12/2021 4:00:42 PM Disp. Time Lamount Cohen Time) Disposition Final User 06/12/2021 3:14:22 PM Send to Urgent Gilford Silvius 06/12/2021 3:24:21 PM Attempt made - message left Maralyn Sago 06/12/2021 3:39:42 PM Attempt made - message left Andrey Cota, Shawna Orleans 06/12/2021 4:03:58 PM Go to ED Now (or PCP triage) Yes Daphine Deutscher, RN, Melanie Caller Disagree/Comply Comply Caller Understands Yes PreDisposition Go to Urgent Care/Walk-In Clinic Care Advice Given Per Guideline GO TO ED NOW (OR PCP TRIAGE): * IF NO PCP (PRIMARY CARE PROVIDER) SECOND-LEVEL TRIAGE: You need to be seen within the next hour. Go to the ED/UCC at  _____________  Hospital. Leave as soon as you can. GENERAL CARE ADVICE FOR COVID-19 SYMPTOMS: CARE ADVICE given per COVID-19 - DIAGNOSED OR SUSPECTED (Adult) guideline. * Feeling dehydrated: Drink extra liquids. If the air in your home is dry, use a humidifier. * Cough: Use cough drops. Referrals GO TO FACILITY UNDECIDED

## 2021-06-13 ENCOUNTER — Other Ambulatory Visit: Payer: Self-pay

## 2021-06-13 ENCOUNTER — Ambulatory Visit
Admission: RE | Admit: 2021-06-13 | Discharge: 2021-06-13 | Disposition: A | Discharge status: Home / Self Care | Payer: 59 | Source: Ambulatory Visit | Attending: Emergency Medicine | Admitting: Emergency Medicine

## 2021-06-13 VITALS — BP 139/79 | HR 90 | Temp 98.9°F | Resp 17 | Ht 70.0 in | Wt 220.0 lb

## 2021-06-13 DIAGNOSIS — J209 Acute bronchitis, unspecified: Secondary | ICD-10-CM

## 2021-06-13 LAB — CULTURE, GROUP A STREP (THRC)

## 2021-06-13 MED ORDER — DOXYCYCLINE HYCLATE 100 MG PO CAPS: 100.0000 mg | ORAL_CAPSULE | Freq: Two times a day (BID) | ORAL | 0 refills | Status: DC

## 2021-06-13 MED ORDER — IPRATROPIUM BROMIDE 0.06 % NA SOLN: 2.0000 | Freq: Four times a day (QID) | NASAL | 12 refills | Status: AC

## 2021-06-13 MED ORDER — PROMETHAZINE-DM 6.25-15 MG/5ML PO SYRP: 5.0000 mL | ORAL_SOLUTION | Freq: Four times a day (QID) | ORAL | 0 refills | Status: DC | PRN

## 2021-06-13 MED ORDER — BENZONATATE 100 MG PO CAPS: 200.0000 mg | ORAL_CAPSULE | Freq: Three times a day (TID) | ORAL | 0 refills | Status: DC

## 2021-06-13 NOTE — ED Provider Notes (Signed)
[facility]    CSN: [id number] Arrival date & time: 06/13/21  1459      History   Chief Complaint Chief Complaint  Patient presents with   Nasal Congestion    HPI Hatim Homann is a 55 y.o. male.   HPI  55 year old male here for evaluation of respiratory complaints.  Patient reports that he has been experiencing nasal congestion, runny nose, sinus pain and pressure for the last 2 weeks.  In the last 4 to 5 days he has developed a productive cough.  He is getting some clear nasal discharge out when he blows his nose and that started today.  He denies any fever or ear pain.  He has been evaluated at the [facility] urgent care recently where he had a negative COVID test.  Past Medical History:  Diagnosis Date   Asthma    Diabetes mellitus without complication ([place])    Fatty liver 09/03/2020   Heart murmur    Hyperlipidemia    Hypertension     Patient Active Problem List   Diagnosis Date Noted   Dermatitis 06/02/2021   Leg cramps 02/26/2021   Right wrist pain 02/26/2021   Diarrhea 02/26/2021   Pain due to onychomycosis of toenails of both feet 01/30/2021   Atherosclerosis of aorta ([place]) 11/07/2020   Coronary artery disease 11/07/2020   Emphysema lung ([place]) 11/07/2020   History of tobacco use 10/15/2020   Hyperlipidemia associated with type 2 diabetes mellitus ([place]) 10/15/2020   Insomnia 10/15/2020   Fatty liver 09/03/2020   BRBPR (bright red blood per rectum) 05/24/2020   Left wrist pain 01/29/2020   Chronic arthropathy 01/29/2020   Chronic gouty arthropathy 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 11/24/2017   Hypersomnia 07/17/2014   OSA (obstructive sleep apnea) 07/17/2014   Snoring 07/17/2014   Cervical radiculopathy 01/22/2011   Hypertension associated with diabetes ([place]) 01/22/2011   Type 2 diabetes mellitus without complication, without long-term current use of insulin ([place]) 01/22/2011   Eczema 04/15/2010   Hyperlipidemia LDL goal <100 03/13/2010   Generalized anxiety  disorder 12/17/2009   Allergic rhinitis 07/30/2009   Asthma 07/29/2009   Depression 03/31/2007   Gout 03/31/2007   Herpes simplex 03/31/2007    Past Surgical History:  Procedure Laterality Date   CERVICAL DISC SURGERY     fusion   KNEE SURGERY Right    ROTATOR CUFF REPAIR     WISDOM TOOTH EXTRACTION         Home Medications    Prior to Admission medications   Medication Sig Start Date End Date Taking? Authorizing Provider  allopurinol (ZYLOPRIM) 100 MG tablet Take 200 mg by mouth daily. 02/21/18  Yes [provider]  ALPRAZolam Duanne Moron) 0.5 MG tablet Take 0.5 mg by mouth as needed. 10/17/18  Yes [provider]  atorvastatin (LIPITOR) 80 MG tablet Take 1 tablet (80 mg total) by mouth daily. 10/15/20  Yes Lesleigh Noe, MD  benzonatate (TESSALON) 100 MG capsule Take 2 capsules (200 mg total) by mouth every 8 (eight) hours. 06/13/21  Yes Margarette Canada, NP  Blood Glucose Monitoring Suppl ([place]) w/Device KIT by Does not apply route. 06/04/17  Yes [provider]  buPROPion (WELLBUTRIN XL) 150 MG 24 hr tablet Take 150 mg by mouth daily. 04/09/20  Yes [provider]  cetirizine (ZYRTEC) 10 MG tablet Take 10 mg by mouth daily.   Yes [provider]  clotrimazole-betamethasone (LOTRISONE) cream Apply 1 application topically daily. 06/02/21  Yes Gardiner Barefoot, DPM  colchicine 0.6 MG tablet Take 0.6 mg by mouth as needed. 06/20/19  Yes [provider]  Continuous Blood Gluc Sensor (FREESTYLE LIBRE 2 SENSOR) MISC Use to check glucose three times daily 10/15/20  Yes Lesleigh Noe, MD  doxycycline (VIBRAMYCIN) 100 MG capsule Take 1 capsule (100 mg total) by mouth 2 (two) times daily. 06/13/21  Yes Margarette Canada, NP  glipiZIDE (GLUCOTROL XL) 10 MG 24 hr tablet Take 10 mg by mouth. 03/17/18  Yes [provider]  ipratropium (ATROVENT) 0.06 % nasal spray Place 2 sprays into both nostrils 4 (four) times daily. 06/13/21  Yes  Margarette Canada, NP  ketoconazole (NIZORAL) 2 % cream APPLY TOPICALLY TWICE A DAY AS NEEDED 04/26/19  Yes [provider]  lisinopril (ZESTRIL) 40 MG tablet Take 1 tablet (40 mg total) by mouth daily. 01/16/21  Yes Lesleigh Noe, MD  Melatonin 10 MG CAPS Take 10 mg by mouth every evening.   Yes [provider]  Multiple Vitamin (MULTI-VITAMIN) tablet Take by mouth.   Yes [provider]  omeprazole (PRILOSEC) 40 MG capsule Take 40 mg by mouth in the morning and at bedtime. 04/27/19  Yes [provider]  OneTouch Delica Lancets 81X MISC by Does not apply route. 06/04/17  Yes [provider]  ONETOUCH VERIO test strip  08/15/18  Yes [provider]  PROAIR HFA 108 (90 Base) MCG/ACT inhaler Inhale 1-2 puffs into the lungs every 6 (six) hours as needed for wheezing or shortness of breath. 04/22/21  Yes Lesleigh Noe, MD  promethazine-dextromethorphan (PROMETHAZINE-DM) 6.25-15 MG/5ML syrup Take 5 mLs by mouth 4 (four) times daily as needed. 06/13/21  Yes Margarette Canada, NP  Semaglutide, 2 MG/DOSE, (OZEMPIC, 2 MG/DOSE,) 8 MG/3ML SOPN Inject into the skin. 03/04/21  Yes [provider]  SYNJARDY XR 12.02-999 MG TB24 Take by mouth. 03/05/21  Yes [provider]  traZODone (DESYREL) 100 MG tablet Take 100 mg by mouth at bedtime. 03/23/21  Yes [provider]    Family History Family History  Problem Relation Age of Onset   Heart attack Father 72   Alcohol abuse Father    Drug abuse Father    Heart disease Father    Lung cancer Father    Hypertension Mother    Heart attack Maternal Grandmother 25   Stroke Maternal Aunt 65    Social History Social History   Tobacco Use   Smoking status: Former    Packs/day: 1.50    Years: 32.00    Pack years: 48.00    Types: Cigarettes    Quit date: 06/11/2018    Years since quitting: 3.0   Smokeless tobacco: Former    Types: Snuff    Quit date: 1996  Vaping Use   Vaping Use: Some days    Devices: x3 per day to quit smoking nicotine   Substance Use Topics   Alcohol use: Yes    Comment: cut back, 6 pack per month   Drug use: Yes    Types: Marijuana    Comment: a few times a year     Allergies   Patient has no known allergies.   Review of Systems Review of Systems  Constitutional:  Negative for activity change, appetite change and fever.  HENT:  Positive for congestion, rhinorrhea and sinus pressure. Negative for ear pain and sore throat.   Respiratory:  Positive for cough. Negative for shortness of breath and wheezing.   Skin:  Negative for rash.  Hematological: Negative.   Psychiatric/Behavioral: Negative.      Physical Exam Triage Vital Signs ED Triage Vitals  Enc Vitals Group     BP 06/13/21 1539 139/79     Pulse Rate 06/13/21 1539 90     Resp 06/13/21 1539 17     Temp 06/13/21 1539 98.9 F (37.2 C)     Temp src --      SpO2 06/13/21 1539 98 %     Weight 06/13/21 1538 220 lb (99.8 kg)     Height 06/13/21 1538 5' 10"  (1.778 m)     Head Circumference --      Peak Flow --      Pain Score 06/13/21 1537 4     Pain Loc --      Pain Edu? --      Excl. in [place]? --    No data found.  Updated Vital Signs BP 139/79 (BP Location: Right Arm)   Pulse 90   Temp 98.9 F (37.2 C)   Resp 17   Ht 5' 10"  (1.778 m)   Wt 220 lb (99.8 kg)   SpO2 98%   BMI 31.57 kg/m   Visual Acuity Right Eye Distance:   Left Eye Distance:   Bilateral Distance:    Right Eye Near:   Left Eye Near:    Bilateral Near:     Physical Exam Vitals and nursing note reviewed.  Constitutional:      General: He is not in acute distress.    Appearance: Normal appearance. He is normal weight. He is not ill-appearing.  HENT:     Head: Normocephalic and atraumatic.     Right Ear: Tympanic membrane, ear canal and external ear normal. There is no impacted cerumen.     Left Ear: Tympanic membrane, ear canal and external ear normal. There is no impacted cerumen.     Nose: Congestion  present. No rhinorrhea.     Mouth/Throat:     Mouth: Mucous membranes are moist.     Pharynx: Oropharynx is clear. No posterior oropharyngeal erythema.  Cardiovascular:     Rate and Rhythm: Normal rate and regular rhythm.     Pulses: Normal pulses.     Heart sounds: Normal heart sounds. No murmur heard.   No gallop.  Pulmonary:     Effort: Pulmonary effort is normal.     Breath sounds: Wheezing and rhonchi present. No rales.  Musculoskeletal:     Cervical back: Normal range of motion and neck supple.  Lymphadenopathy:     Cervical: No cervical adenopathy.  Skin:    General: Skin is warm and dry.     Capillary Refill: Capillary refill takes less than 2 seconds.     Findings: No erythema or rash.  Neurological:     General: No focal deficit present.     Mental Status: He is alert and oriented to person, place, and time.  Psychiatric:        Mood and Affect: Mood normal.        Behavior: Behavior normal.        Thought Content: Thought content normal.        Judgment: Judgment normal.     UC Treatments / Results  Labs (all labs ordered are listed, but only abnormal results are displayed) Labs Reviewed - No data to display  EKG   Radiology No results found.  Procedures Procedures (including critical care time)  Medications Ordered in UC Medications - No data to  display  Initial Impression / Assessment and Plan / UC Course  I have reviewed the triage vital signs and the nursing notes.  Pertinent labs & imaging results that were available during my care of the patient were reviewed by me and considered in my medical decision making (see chart for details).  Patient is a very pleasant, nontoxic-appearing 55 year old male here for evaluation of respiratory complaints as outlined in the HPI above.  Patient's physical exam reveals pearly gray tympanic membranes bilaterally with normal light reflex and clear external auditory canals.  Nasal mucosa is mildly erythematous and  edematous without nasal discharge.  No tenderness to percussion of maxillary or frontal sinuses.  Oropharyngeal exam is benign.  Patient has no cervical lymphadenopathy appreciated on exam.  Cardiopulmonary exam reveals S1-S2 heart sounds.  Lung sounds reveal wheezes and rhonchi in upper lung fields bilaterally.  Patient's exam is consistent with bronchitis.  We will treat with doxycycline twice daily for 10 days, Atrovent nasal spray to help with nasal congestion, Tessalon Perles and Promethazine DM cough syrup to help with cough.   Final Clinical Impressions(s) / UC Diagnoses   Final diagnoses:  Acute bronchitis, unspecified organism     Discharge Instructions      Take the Doxycycline twice daily with food for 10 days.  Use the Atrovent nasal spray, 2 squirts in each nostril every 6 hours, as needed for runny nose and postnasal drip.  Use the Tessalon Perles every 8 hours during the day.  Take them with a small sip of water.  They may give you some numbness to the base of your tongue or a metallic taste in your mouth, this is normal.  Use the Promethazine DM cough syrup at bedtime for cough and congestion.  It will make you drowsy so do not take it during the day.  Return for reevaluation or see your primary care provider for any new or worsening symptoms.      ED Prescriptions     Medication Sig Dispense Auth. Provider   doxycycline (VIBRAMYCIN) 100 MG capsule Take 1 capsule (100 mg total) by mouth 2 (two) times daily. 20 capsule Margarette Canada, NP   benzonatate (TESSALON) 100 MG capsule Take 2 capsules (200 mg total) by mouth every 8 (eight) hours. 21 capsule Margarette Canada, NP   promethazine-dextromethorphan (PROMETHAZINE-DM) 6.25-15 MG/5ML syrup Take 5 mLs by mouth 4 (four) times daily as needed. 118 mL Margarette Canada, NP   ipratropium (ATROVENT) 0.06 % nasal spray Place 2 sprays into both nostrils 4 (four) times daily. 15 mL Margarette Canada, NP      PDMP not reviewed this  encounter.   Margarette Canada, NP 06/13/21 1557

## 2021-06-13 NOTE — Telephone Encounter (Signed)
Noted  

## 2021-06-13 NOTE — ED Triage Notes (Signed)
Patient states that he has been having nasal congestion, runny nose x 8/26. States that he was seen at [facility] on 09/6 with negative covid testing. States that he has continued to have sinus pain and pressure and chest congestion and cough. States that he thinks everything has settled in his chest.

## 2021-06-13 NOTE — Discharge Instructions (Addendum)
Take the Doxycycline twice daily with food for 10 days.  Use the Atrovent nasal spray, 2 squirts in each nostril every 6 hours, as needed for runny nose and postnasal drip.  Use the Tessalon Perles every 8 hours during the day.  Take them with a small sip of water.  They may give you some numbness to the base of your tongue or a metallic taste in your mouth, this is normal.  Use the Promethazine DM cough syrup at bedtime for cough and congestion.  It will make you drowsy so do not take it during the day.  Return for reevaluation or see your primary care provider for any new or worsening symptoms.

## 2021-06-18 ENCOUNTER — Ambulatory Visit: Payer: 59

## 2021-07-03 ENCOUNTER — Ambulatory Visit (INDEPENDENT_AMBULATORY_CARE_PROVIDER_SITE_OTHER): Payer: 59

## 2021-07-03 ENCOUNTER — Other Ambulatory Visit: Payer: Self-pay

## 2021-07-03 DIAGNOSIS — Z23 Encounter for immunization: Secondary | ICD-10-CM | POA: Diagnosis not present

## 2021-07-07 ENCOUNTER — Other Ambulatory Visit: Payer: Self-pay | Admitting: Family Medicine

## 2021-07-25 ENCOUNTER — Ambulatory Visit: Payer: 59

## 2021-09-06 ENCOUNTER — Other Ambulatory Visit: Payer: Self-pay | Admitting: Family Medicine

## 2021-09-06 DIAGNOSIS — E785 Hyperlipidemia, unspecified: Secondary | ICD-10-CM

## 2021-09-06 DIAGNOSIS — E1169 Type 2 diabetes mellitus with other specified complication: Secondary | ICD-10-CM

## 2021-09-06 DIAGNOSIS — E119 Type 2 diabetes mellitus without complications: Secondary | ICD-10-CM

## 2021-09-25 ENCOUNTER — Ambulatory Visit: Payer: 59 | Admitting: Podiatry

## 2021-10-16 ENCOUNTER — Ambulatory Visit (INDEPENDENT_AMBULATORY_CARE_PROVIDER_SITE_OTHER): Payer: 59

## 2021-10-16 ENCOUNTER — Other Ambulatory Visit: Payer: Self-pay

## 2021-10-16 DIAGNOSIS — Z23 Encounter for immunization: Secondary | ICD-10-CM | POA: Diagnosis not present

## 2021-10-16 NOTE — Progress Notes (Signed)
Per orders of Mort Sawyers, NP, in Dr. Elmyra Ricks absence, 2nd injection of shingrix given by Erby Pian. Patient tolerated injection well.

## 2021-10-23 ENCOUNTER — Other Ambulatory Visit: Payer: Self-pay

## 2021-10-23 ENCOUNTER — Encounter: Payer: Self-pay | Admitting: Podiatry

## 2021-10-23 ENCOUNTER — Ambulatory Visit: Payer: 59 | Admitting: Podiatry

## 2021-10-23 DIAGNOSIS — M79675 Pain in left toe(s): Secondary | ICD-10-CM | POA: Diagnosis not present

## 2021-10-23 DIAGNOSIS — M79674 Pain in right toe(s): Secondary | ICD-10-CM

## 2021-10-23 DIAGNOSIS — E119 Type 2 diabetes mellitus without complications: Secondary | ICD-10-CM

## 2021-10-23 DIAGNOSIS — M129 Arthropathy, unspecified: Secondary | ICD-10-CM | POA: Diagnosis not present

## 2021-10-23 DIAGNOSIS — B351 Tinea unguium: Secondary | ICD-10-CM | POA: Diagnosis not present

## 2021-10-23 NOTE — Progress Notes (Signed)
This patient returns to my office for at risk foot care.  This patient requires this care by a professional since this patient will be at risk due to having diabetes.  This patient is unable to cut nails himself since the patient cannot reach his nails.These nails are painful walking and wearing shoes.   This patient presents for at risk foot care today.  General Appearance  Alert, conversant and in no acute stress.  Vascular  Dorsalis pedis and posterior tibial  pulses are palpable  bilaterally.  Capillary return is within normal limits  bilaterally. Temperature is within normal limits  bilaterally.  Neurologic  Senn-Weinstein monofilament wire test within normal limits  bilaterally. Muscle power within normal limits bilaterally.  Nails Thick disfigured discolored nails with subungual debris  from hallux to fifth toes bilaterally. No evidence of bacterial infection or drainage bilaterally.  Orthopedic  No limitations of motion  feet .  No crepitus or effusions noted.  No digital deformities noted.  HAV  B/L.  Skin  normotropic skin with no porokeratosis noted bilaterally.  No signs of infections or ulcers noted.     Onychomycosis  Pain in right toes  Pain in left toes  Consent was obtained for treatment procedures.   Mechanical debridement of nails 1-5  bilaterally performed with a nail nipper.  Filed with dremel without incident.    Return office visit   4 months                   Told patient to return for periodic foot care and evaluation due to potential at risk complications.  Prescribe lotroisone cream.   Helane Gunther DPM

## 2021-10-24 ENCOUNTER — Other Ambulatory Visit: Payer: Self-pay | Admitting: Orthopedic Surgery

## 2021-10-24 DIAGNOSIS — M92211 Osteochondrosis (juvenile) of carpal lunate [Kienbock], right hand: Secondary | ICD-10-CM

## 2021-11-07 ENCOUNTER — Ambulatory Visit
Admission: RE | Admit: 2021-11-07 | Discharge: 2021-11-07 | Disposition: A | Discharge status: Home / Self Care | Payer: 59 | Source: Ambulatory Visit | Attending: Orthopedic Surgery | Admitting: Orthopedic Surgery

## 2021-11-07 DIAGNOSIS — M92211 Osteochondrosis (juvenile) of carpal lunate [Kienbock], right hand: Secondary | ICD-10-CM

## 2021-11-13 LAB — HM DIABETES EYE EXAM

## 2021-11-18 ENCOUNTER — Other Ambulatory Visit: Payer: Self-pay | Admitting: Family Medicine

## 2021-11-18 DIAGNOSIS — E119 Type 2 diabetes mellitus without complications: Secondary | ICD-10-CM

## 2021-12-08 ENCOUNTER — Encounter: Payer: Self-pay | Admitting: Family Medicine

## 2021-12-09 NOTE — Telephone Encounter (Signed)
Last filled by historical provider. ?Last OV: 04/22/21 for CPE ?Next OV: 05/05/22 for CPE ?

## 2021-12-10 MED ORDER — OMEPRAZOLE 40 MG PO CPDR: 40.0000 mg | DELAYED_RELEASE_CAPSULE | Freq: Two times a day (BID) | ORAL | 1 refills | Status: DC

## 2022-01-01 ENCOUNTER — Encounter: Payer: Self-pay | Admitting: Family Medicine

## 2022-01-21 ENCOUNTER — Other Ambulatory Visit: Payer: Self-pay

## 2022-01-21 DIAGNOSIS — Z87891 Personal history of nicotine dependence: Secondary | ICD-10-CM

## 2022-01-21 DIAGNOSIS — Z122 Encounter for screening for malignant neoplasm of respiratory organs: Secondary | ICD-10-CM

## 2022-02-02 ENCOUNTER — Ambulatory Visit
Admission: RE | Admit: 2022-02-02 | Discharge: 2022-02-02 | Disposition: A | Discharge status: Home / Self Care | Payer: 59 | Source: Ambulatory Visit | Attending: Acute Care | Admitting: Acute Care

## 2022-02-02 DIAGNOSIS — Z87891 Personal history of nicotine dependence: Secondary | ICD-10-CM | POA: Insufficient documentation

## 2022-02-02 DIAGNOSIS — Z122 Encounter for screening for malignant neoplasm of respiratory organs: Secondary | ICD-10-CM | POA: Diagnosis present

## 2022-02-03 ENCOUNTER — Other Ambulatory Visit: Payer: Self-pay

## 2022-02-03 DIAGNOSIS — Z87891 Personal history of nicotine dependence: Secondary | ICD-10-CM

## 2022-02-03 DIAGNOSIS — Z122 Encounter for screening for malignant neoplasm of respiratory organs: Secondary | ICD-10-CM

## 2022-02-11 ENCOUNTER — Other Ambulatory Visit: Payer: Self-pay | Admitting: Family Medicine

## 2022-02-17 LAB — PROTEIN / CREATININE RATIO, URINE: Creatinine, Urine: 64.1

## 2022-02-17 LAB — COMPREHENSIVE METABOLIC PANEL: eGFR: 7.9

## 2022-02-17 LAB — MICROALBUMIN / CREATININE URINE RATIO: Microalb Creat Ratio: 10.9

## 2022-02-23 ENCOUNTER — Ambulatory Visit: Payer: 59 | Admitting: Podiatry

## 2022-02-23 ENCOUNTER — Encounter: Payer: Self-pay | Admitting: Podiatry

## 2022-02-23 DIAGNOSIS — M79675 Pain in left toe(s): Secondary | ICD-10-CM | POA: Diagnosis not present

## 2022-02-23 DIAGNOSIS — M129 Arthropathy, unspecified: Secondary | ICD-10-CM

## 2022-02-23 DIAGNOSIS — B351 Tinea unguium: Secondary | ICD-10-CM | POA: Diagnosis not present

## 2022-02-23 DIAGNOSIS — E119 Type 2 diabetes mellitus without complications: Secondary | ICD-10-CM

## 2022-02-23 DIAGNOSIS — M79674 Pain in right toe(s): Secondary | ICD-10-CM

## 2022-02-23 NOTE — Progress Notes (Signed)
This patient returns to my office for at risk foot care.  This patient requires this care by a professional since this patient will be at risk due to having diabetes.  This patient is unable to cut nails himself since the patient cannot reach his nails.These nails are painful walking and wearing shoes.   This patient presents for at risk foot care today.  General Appearance  Alert, conversant and in no acute stress.  Vascular  Dorsalis pedis and posterior tibial  pulses are palpable  bilaterally.  Capillary return is within normal limits  bilaterally. Temperature is within normal limits  bilaterally.  Neurologic  Senn-Weinstein monofilament wire test within normal limits  bilaterally. Muscle power within normal limits bilaterally.  Nails Thick disfigured discolored nails with subungual debris  from hallux to fifth toes bilaterally. No evidence of bacterial infection or drainage bilaterally.  Orthopedic  No limitations of motion  feet .  No crepitus or effusions noted.  No digital deformities noted.  HAV  B/L.  Skin  normotropic skin with no porokeratosis noted bilaterally.  No signs of infections or ulcers noted.     Onychomycosis  Pain in right toes  Pain in left toes  Consent was obtained for treatment procedures.   Mechanical debridement of nails 1-5  bilaterally performed with a nail nipper.  Filed with dremel without incident.    Return office visit   4 months                   Told patient to return for periodic foot care and evaluation due to potential at risk complications.     Lucious Zou DPM  

## 2022-02-26 ENCOUNTER — Other Ambulatory Visit: Payer: Self-pay | Admitting: Family Medicine

## 2022-02-26 DIAGNOSIS — M5416 Radiculopathy, lumbar region: Secondary | ICD-10-CM

## 2022-02-26 DIAGNOSIS — M5136 Other intervertebral disc degeneration, lumbar region: Secondary | ICD-10-CM

## 2022-03-11 ENCOUNTER — Telehealth: Payer: Self-pay | Admitting: Family Medicine

## 2022-03-11 MED ORDER — OMEPRAZOLE 40 MG PO CPDR: 40.0000 mg | DELAYED_RELEASE_CAPSULE | Freq: Two times a day (BID) | ORAL | 1 refills | Status: DC

## 2022-03-11 NOTE — Telephone Encounter (Signed)
[facility].

## 2022-03-11 NOTE — Telephone Encounter (Signed)
Pt called and said that [facility] a new script sent in for omeprazole (PRILOSEC) 40 MG capsule, He said he normally gets a 90 day supply but the last script was sent in for 90 capsules taking twice a day. He would like it sent for a [facility] day supply [facility]. Call back for patient is [phone]

## 2022-03-13 ENCOUNTER — Ambulatory Visit
Admission: RE | Admit: 2022-03-13 | Discharge: 2022-03-13 | Disposition: A | Discharge status: Home / Self Care | Payer: 59 | Source: Ambulatory Visit | Attending: Family Medicine | Admitting: Family Medicine

## 2022-03-13 DIAGNOSIS — M5136 Other intervertebral disc degeneration, lumbar region: Secondary | ICD-10-CM

## 2022-03-13 DIAGNOSIS — M5416 Radiculopathy, lumbar region: Secondary | ICD-10-CM

## 2022-03-15 ENCOUNTER — Encounter: Payer: Self-pay | Admitting: Family Medicine

## 2022-03-17 ENCOUNTER — Encounter: Payer: Self-pay | Admitting: Podiatry

## 2022-03-19 NOTE — Telephone Encounter (Signed)
Please advise 

## 2022-04-28 ENCOUNTER — Encounter: Payer: 59 | Admitting: Family Medicine

## 2022-05-05 ENCOUNTER — Encounter: Payer: 59 | Admitting: Family Medicine

## 2022-05-07 ENCOUNTER — Ambulatory Visit: Payer: 59 | Admitting: Family Medicine

## 2022-05-07 VITALS — BP 122/78 | HR 90 | Temp 97.4°F | Ht 70.0 in | Wt 226.0 lb

## 2022-05-07 DIAGNOSIS — B359 Dermatophytosis, unspecified: Secondary | ICD-10-CM | POA: Diagnosis not present

## 2022-05-07 DIAGNOSIS — L309 Dermatitis, unspecified: Secondary | ICD-10-CM

## 2022-05-07 MED ORDER — CLOTRIMAZOLE-BETAMETHASONE 1-0.05 % EX CREA: 1.0000 | TOPICAL_CREAM | Freq: Every day | CUTANEOUS | 0 refills | Status: DC

## 2022-05-07 NOTE — Assessment & Plan Note (Signed)
Rash seems consistent with a tinea infection.  Has responded to clotrimazole, but with persistent itching.  Will prescribe Lotrisone combination medication.  He will update in approximately 1 to 2 weeks if the rash is not continuing to improve to consider alternative therapy.  He also has a podiatry appointment coming up and will see me in September.

## 2022-05-07 NOTE — Patient Instructions (Signed)
Use the cream twice daily Can take up to 2-3 weeks

## 2022-05-07 NOTE — Progress Notes (Signed)
   Subjective:     Kevin Huerta is a 56 y.o. male presenting for Rash (R ankle x 40 days )     Rash    #rash - right ankle - x 40 days - tried cortisone for a couple of weeks - this would help the itch but it never went away - using lotromin now x 5 days  - does not seem as inflammed - using the lotrimin twice daily - has some itchy spots on the other ankle too  Clotrimazole cream  Review of Systems  Skin:  Positive for rash.     Social History   Tobacco Use  Smoking Status Former   Packs/day: 1.50   Years: 32.00   Total pack years: 48.00   Types: Cigarettes   Quit date: 06/11/2018   Years since quitting: 3.9  Smokeless Tobacco Former   Types: Snuff   Quit date: 1996        Objective:    BP Readings from Last 3 Encounters:  05/07/22 122/78  06/13/21 139/79  06/10/21 115/77   Wt Readings from Last 3 Encounters:  05/07/22 226 lb (102.5 kg)  02/02/22 220 lb (99.8 kg)  06/13/21 220 lb (99.8 kg)    BP 122/78   Pulse 90   Temp (!) 97.4 F (36.3 C) (Temporal)   Ht 5\' 10"  (1.778 m)   Wt 226 lb (102.5 kg)   SpO2 98%   BMI 32.43 kg/m    Physical Exam Constitutional:      Appearance: Normal appearance. He is not ill-appearing or diaphoretic.  HENT:     Right Ear: External ear normal.     Left Ear: External ear normal.     Nose: Nose normal.  Eyes:     General: No scleral icterus.    Extraocular Movements: Extraocular movements intact.     Conjunctiva/sclera: Conjunctivae normal.  Cardiovascular:     Rate and Rhythm: Normal rate.  Pulmonary:     Effort: Pulmonary effort is normal.  Musculoskeletal:     Cervical back: Neck supple.  Skin:    General: Skin is warm and dry.     Comments: Erythematous rash with active border on the right medial ankle. Does have a few scattered dry patches on the left medial ankle well circumscribed. Also some dry and faintly erythematous skin on the base of the foot.   Neurological:     Mental Status: He is  alert. Mental status is at baseline.  Psychiatric:        Mood and Affect: Mood normal.           Assessment & Plan:   Problem List Items Addressed This Visit       Musculoskeletal and Integument   Dermatitis   Tinea - Primary    Rash seems consistent with a tinea infection.  Has responded to clotrimazole, but with persistent itching.  Will prescribe Lotrisone combination medication.  He will update in approximately 1 to 2 weeks if the rash is not continuing to improve to consider alternative therapy.  He also has a podiatry appointment coming up and will see me in September.      Relevant Medications   clotrimazole-betamethasone (LOTRISONE) cream     No follow-ups on file.  October, MD

## 2022-06-01 ENCOUNTER — Ambulatory Visit: Payer: 59 | Admitting: Podiatry

## 2022-06-01 ENCOUNTER — Encounter: Payer: Self-pay | Admitting: Family Medicine

## 2022-06-01 ENCOUNTER — Ambulatory Visit: Payer: 59 | Admitting: Family Medicine

## 2022-06-01 VITALS — BP 132/70 | HR 83 | Temp 98.1°F | Ht 70.0 in | Wt 225.0 lb

## 2022-06-01 DIAGNOSIS — K760 Fatty (change of) liver, not elsewhere classified: Secondary | ICD-10-CM

## 2022-06-01 DIAGNOSIS — E1169 Type 2 diabetes mellitus with other specified complication: Secondary | ICD-10-CM

## 2022-06-01 DIAGNOSIS — Z23 Encounter for immunization: Secondary | ICD-10-CM | POA: Diagnosis not present

## 2022-06-01 DIAGNOSIS — I2510 Atherosclerotic heart disease of native coronary artery without angina pectoris: Secondary | ICD-10-CM

## 2022-06-01 DIAGNOSIS — E119 Type 2 diabetes mellitus without complications: Secondary | ICD-10-CM

## 2022-06-01 DIAGNOSIS — F3289 Other specified depressive episodes: Secondary | ICD-10-CM

## 2022-06-01 DIAGNOSIS — J432 Centrilobular emphysema: Secondary | ICD-10-CM | POA: Diagnosis not present

## 2022-06-01 DIAGNOSIS — G4701 Insomnia due to medical condition: Secondary | ICD-10-CM

## 2022-06-01 DIAGNOSIS — M129 Arthropathy, unspecified: Secondary | ICD-10-CM

## 2022-06-01 DIAGNOSIS — E785 Hyperlipidemia, unspecified: Secondary | ICD-10-CM

## 2022-06-01 DIAGNOSIS — I700 Atherosclerosis of aorta: Secondary | ICD-10-CM

## 2022-06-01 DIAGNOSIS — F411 Generalized anxiety disorder: Secondary | ICD-10-CM

## 2022-06-01 DIAGNOSIS — I152 Hypertension secondary to endocrine disorders: Secondary | ICD-10-CM

## 2022-06-01 DIAGNOSIS — E1159 Type 2 diabetes mellitus with other circulatory complications: Secondary | ICD-10-CM | POA: Diagnosis not present

## 2022-06-01 DIAGNOSIS — G4733 Obstructive sleep apnea (adult) (pediatric): Secondary | ICD-10-CM

## 2022-06-01 NOTE — Patient Instructions (Signed)
It was a pleasure meeting you today. Thank you for allowing me to take part in your health care.  Our goals for today as we discussed include:  Follow up with Dr Selena Batten as scheduled   Will review chart to make recommendations.   Please follow-up with PCP as needed  If you have any questions or concerns, please do not hesitate to call the office at [phone].  I look forward to our next visit and until then take care and stay safe.  Regards,   Dana Allan, MD   [facility]

## 2022-06-01 NOTE — Progress Notes (Signed)
SUBJECTIVE:   CHIEF COMPLAINT / HPI: transfer of care  Patient presents to clinic to transfer care  No acute concerns today  HTN Asymptomatic.  Tolerating medications well.  Currently taking Lisinopril mg daily.  Denies any headaches, visual disturbances, chest pain, shortness of breath or lower extremity edema.    PERTINENT  PMH / PSH:  HTN CAD DM Type 2 Hepatic Steatosis HLD OSA Asthma/COPD Gout Eczema GAD/Depression/Insomnia   OBJECTIVE:   BP 132/70 (BP Location: Left Arm, Patient Position: Sitting, Cuff Size: Normal)   Pulse 83   Temp 98.1 F (36.7 C) (Oral)   Ht 5\' 10"  (1.778 m)   Wt 225 lb (102.1 kg)   SpO2 98%   BMI 32.28 kg/m    General: Alert, no acute distress Cardio: Normal S1 and S2, RRR, no r/m/g Pulm: CTAB, normal work of breathing Abdomen: Bowel sounds normal. Abdomen soft and non-tender.  Extremities: No peripheral edema.  Neuro: Cranial nerves grossly intact   ASSESSMENT/PLAN:   Hyperlipidemia associated with type 2 diabetes mellitus (HCC) Would like goal<50.  Followed by Endocrinology. Recent labs reviewed for 05/23,  LDL 53.  Tolerating medications.  No myalgias -Continue to follow with Endocrinology -Continue Lipitor 80 mg daily -Would recommend addition of Zetia 10 mg daily   Emphysema lung (HCC) Previous Tobacco use x 20-30 yrs. 1ppd.  Quit 3 2023.  Asymptomatic today.  Follows with [facility].   -Continue to follow up with Pulmonology -Continue albuterol 1-2 puffs q6h prn -Recent Low Dose CT chest negative for malignancy, repeat in 12 months.  Atherosclerosis of aorta (HCC) Currently on Atorvastatin 80 mg daily  Coronary artery disease Recent LDL 53. Currently on statin  Insomnia Chronic.  Takes trazodone 100 mg prn.  Not currently using CPAP.   -Recommend restarting CPAP -Recommend limited use of Trazodone given OSA, can try to wean in future -Recommend working on sleep hygiene techniques, patient  receptive -Follows with Psychiatry , continue to follow as scheduled  Fatty liver Found on CT. Reviewed recent LFTs from 05/23, wnl.  Asymtpomatic -Followed by GI -Could consider u/s for elastography if not completed in future to check for fibrosis.   Type 2 diabetes mellitus without complication, without long-term current use of insulin (HCC) Asymptomatic.  Last A1c 7.3.  Tolerating medications well.  Follows with Dr 6/23, Endocrinology. -Continue Ozempic 1 mg s/c weekly, could consider increasing to 2 mg weekly to help achieve A1c goal<7 and wold help with lowering BMI. -Continue Glipizide XL 10 mg daily, could consider discontinuing in future -Continue Empagliflozin-Metformin  12.02-999 mg daily, could consider increasing to 25-2000 mg daily -Follow up with Endocrinology as scheduled   OSA (obstructive sleep apnea) Not using CPAP Recommend restarting Recommending avoiding sedative medications if possible   Hypertension associated with diabetes (HCC) Not at goal.  Has not taken antihypertensives today.  Currently on Lisinopril 40 mg daily.   -Recommend switching to Losartan 50 mg daily, given increase risk of angioedema in AA population.  Patient agreeable and will discuss revisit at next visit given he  has appointment with previous PCP for annual in September and will have lab work completed there.   Depression Follows with psychiatry.  Tolerating medications well. Denies any SI/HI -Continue Wellbutrin XL 150 mg daily -Follow up with Psychiatry as scheduled   Generalized anxiety disorder Follows with psychiatry. Currently on Xanax.  Currently taking Xanax 0.05 mg prn, would limit use given history of OSA -Follow up with psychiatry as scheduled -Could consider  use of SSRI given has now quit smoking and may benefit from Lexapro for both depression and anxiety.    PDMP Reviewed  Dana Allan, MD

## 2022-06-08 ENCOUNTER — Encounter: Payer: Self-pay | Admitting: Family Medicine

## 2022-06-08 NOTE — Assessment & Plan Note (Signed)
Asymptomatic.  Last A1c 7.3.  Tolerating medications well.  Follows with Dr Gershon Crane, Endocrinology. -Continue Ozempic 1 mg s/c weekly, could consider increasing to 2 mg weekly to help achieve A1c goal<7 and wold help with lowering BMI. -Continue Glipizide XL 10 mg daily, could consider discontinuing in future -Continue Empagliflozin-Metformin  12.02-999 mg daily, could consider increasing to 25-2000 mg daily -Follow up with Endocrinology as scheduled

## 2022-06-08 NOTE — Assessment & Plan Note (Signed)
Currently on Atorvastatin 80 mg daily

## 2022-06-08 NOTE — Assessment & Plan Note (Signed)
Previous Tobacco use x 20-30 yrs. 1ppd.  Quit 3 2023.  Asymptomatic today.  Follows with [facility].   -Continue to follow up with Pulmonology -Continue albuterol 1-2 puffs q6h prn -Recent Low Dose CT chest negative for malignancy, repeat in 12 months.

## 2022-06-08 NOTE — Assessment & Plan Note (Signed)
Follows with psychiatry. Currently on Xanax.  Currently taking Xanax 0.05 mg prn, would limit use given history of OSA -Follow up with psychiatry as scheduled -Could consider use of SSRI given has now quit smoking and may benefit from Lexapro for both depression and anxiety.

## 2022-06-08 NOTE — Assessment & Plan Note (Signed)
Found on CT. Reviewed recent LFTs from 05/23, wnl.  Asymtpomatic -Followed by GI -Could consider u/s for elastography if not completed in future to check for fibrosis.

## 2022-06-08 NOTE — Assessment & Plan Note (Signed)
Not at goal.  Has not taken antihypertensives today.  Currently on Lisinopril 40 mg daily.   -Recommend switching to Losartan 50 mg daily, given increase risk of angioedema in AA population.  Patient agreeable and will discuss revisit at next visit given he  has appointment with previous PCP for annual in September and will have lab work completed there.

## 2022-06-08 NOTE — Assessment & Plan Note (Signed)
Recent LDL 53. Currently on statin

## 2022-06-08 NOTE — Assessment & Plan Note (Signed)
Chronic.  Takes trazodone 100 mg prn.  Not currently using CPAP.   -Recommend restarting CPAP -Recommend limited use of Trazodone given OSA, can try to wean in future -Recommend working on sleep hygiene techniques, patient receptive -Follows with Psychiatry , continue to follow as scheduled

## 2022-06-08 NOTE — Assessment & Plan Note (Signed)
Would like goal<50.  Followed by Endocrinology. Recent labs reviewed for 05/23,  LDL 53.  Tolerating medications.  No myalgias -Continue to follow with Endocrinology -Continue Lipitor 80 mg daily -Would recommend addition of Zetia 10 mg daily

## 2022-06-08 NOTE — Assessment & Plan Note (Signed)
Follows with psychiatry.  Tolerating medications well. Denies any SI/HI -Continue Wellbutrin XL 150 mg daily -Follow up with Psychiatry as scheduled

## 2022-06-08 NOTE — Assessment & Plan Note (Signed)
Not using CPAP Recommend restarting Recommending avoiding sedative medications if possible

## 2022-06-15 ENCOUNTER — Ambulatory Visit (INDEPENDENT_AMBULATORY_CARE_PROVIDER_SITE_OTHER): Payer: 59 | Admitting: Podiatry

## 2022-06-15 ENCOUNTER — Ambulatory Visit (INDEPENDENT_AMBULATORY_CARE_PROVIDER_SITE_OTHER): Payer: 59 | Admitting: Family Medicine

## 2022-06-15 ENCOUNTER — Encounter: Payer: Self-pay | Admitting: Podiatry

## 2022-06-15 VITALS — BP 106/58 | HR 90 | Temp 97.7°F | Ht 68.5 in | Wt 221.4 lb

## 2022-06-15 DIAGNOSIS — E119 Type 2 diabetes mellitus without complications: Secondary | ICD-10-CM

## 2022-06-15 DIAGNOSIS — M79675 Pain in left toe(s): Secondary | ICD-10-CM | POA: Diagnosis not present

## 2022-06-15 DIAGNOSIS — Z125 Encounter for screening for malignant neoplasm of prostate: Secondary | ICD-10-CM | POA: Diagnosis not present

## 2022-06-15 DIAGNOSIS — M5441 Lumbago with sciatica, right side: Secondary | ICD-10-CM

## 2022-06-15 DIAGNOSIS — B351 Tinea unguium: Secondary | ICD-10-CM

## 2022-06-15 DIAGNOSIS — G8929 Other chronic pain: Secondary | ICD-10-CM | POA: Insufficient documentation

## 2022-06-15 DIAGNOSIS — Z0000 Encounter for general adult medical examination without abnormal findings: Secondary | ICD-10-CM | POA: Diagnosis not present

## 2022-06-15 DIAGNOSIS — M79674 Pain in right toe(s): Secondary | ICD-10-CM

## 2022-06-15 DIAGNOSIS — M129 Arthropathy, unspecified: Secondary | ICD-10-CM

## 2022-06-15 LAB — PSA: PSA: 0.51 ng/mL (ref 0.10–4.00)

## 2022-06-15 NOTE — Patient Instructions (Addendum)
Dr. Kerby Nora Mort Sawyers  -- for your daughter  #Referral I have placed a referral to a specialist for you. You should receive a phone call from the specialty office. Make sure your voicemail is not full and that if you are able to answer your phone to unknown or new numbers.   It may take up to 2 weeks to hear about the referral. If you do not hear anything in 2 weeks, please call our office and ask to speak with the referral coordinator.  - neurosurgery

## 2022-06-15 NOTE — Progress Notes (Signed)
Assessment: 55 y.o. here for routine annual physical examination.  Plan: Problem List Items Addressed This Visit       Nervous and Auditory   Chronic bilateral low back pain with right-sided sciatica    S/p steroid injections w/o improvement. Advised surgery consult - referral placed      Relevant Orders   Ambulatory referral to Neurosurgery   Other Visit Diagnoses     Annual physical exam    -  Primary   Screening for prostate cancer       Relevant Orders   PSA       Screening: -- Blood pressure screen normal -- cholesterol screening:  reviewed outside labs, stable -- Weight screening: overweight: continue to monitor -- Diabetes Screening:  sees endo -- Nutrition: normal - Encouraged healthy diet  The 10-year ASCVD risk score (Arnett DK, et al., 2019) is: 13.5%   Values used to calculate the score:     Age: 55 years     Sex: Male     Is Non-Hispanic African American: Yes     Diabetic: Yes     Tobacco  smoker: No     Systolic Blood Pressure: 106 mmHg     Is BP treated: Yes     HDL Cholesterol: 42.1 mg/dL     Total Cholesterol: 136 mg/dL  -- ASA 81 mg discussed if CVD risk >10% age 50-59 and willing to take for 10 years -- Statin therapy for Age 40-75 with CVD risk >7.5%  Psych -- Depression screening (PHQ-9): negative  Safety -- tobacco screening: not using -- alcohol screening: low-risk usage. -- no evidence of domestic violence or intimate partner violence.  Cancer Screening -- Prostate (age 55-69) ordered -- Colon (age 45-75)  up to date -- Lung  up to date    Immunizations Immunization History  Administered Date(s) Administered   Hepatitis A 06/17/1967   Hepatitis B 07/24/1966   Influenza, Seasonal, Injecte, Preservative Fre 07/22/2012, 07/20/2015, 06/22/2016, 08/23/2018   Influenza,inj,Quad PF,6+ Mos 05/24/2020, 06/01/2022   Influenza,trivalent, recombinat, inj, PF 08/28/2013, 07/02/2014   Influenza-Unspecified 07/22/2012   MMR 06/17/1967   Moderna Sars-Covid-2 Vaccination 11/27/2019, 01/02/2020, 08/21/2020   Pneumococcal Polysaccharide-23 08/25/2012   Pneumococcal-Unspecified 09/25/2003, 08/25/2012   Td 06/29/2006, 12/23/2016   Tdap 06/29/2006, 12/23/2016   Unspecified SARS-COV-2 Vaccination 01/17/2021, 07/21/2021   Varicella 06/17/1979   Zoster Recombinat (Shingrix) 07/03/2021, 10/16/2021   Zoster, Live 06/16/2016    -- flu vaccine up to date -- TDAP q10 years up to date -- Shingles (age >50) up to date -- PPSV-23 (19-64 with chronic disease or smoking) up to date  -- Covid-19 Vaccine up to date  Encouraged regular vision and dental screening. Encouraged healthy exercise and diet.   Kevin Huerta    Assessment: 56 y.o. here for routine annual physical examination.  Plan: Problem List Items Addressed This Visit       Nervous and Auditory   Chronic bilateral low back pain with right-sided sciatica    S/p steroid injections w/o improvement. Advised surgery consult - referral placed      Relevant Orders   Ambulatory referral to Neurosurgery   Other Visit Diagnoses     Annual physical exam    -  Primary   Screening for prostate cancer       Relevant Orders   PSA       Screening: -- Blood pressure screen normal -- cholesterol screening:  reviewed outside labs, stable -- Weight screening: overweight: continue to monitor -- Diabetes Screening:  sees endo -- Nutrition: normal - Encouraged healthy diet  The 10-year ASCVD risk score (Arnett DK, et al., 2019) is: 13.5%   Values used to calculate the score:     Age: 83 years     Sex: Male     Is Non-Hispanic African American: Yes     Diabetic: Yes     Tobacco  smoker: No     Systolic Blood Pressure: 846 mmHg     Is BP treated: Yes     HDL Cholesterol: 42.1 mg/dL     Total Cholesterol: 136 mg/dL  -- ASA 81 mg discussed if CVD risk >10% age 34-59 and willing to take for 10 years -- Statin therapy for Age 27-75 with CVD risk >7.5%  Psych -- Depression screening (PHQ-9): negative  Safety -- tobacco screening: not using -- alcohol screening: low-risk usage. -- no evidence of domestic violence or intimate partner violence.  Cancer Screening -- Prostate (age 28-69) ordered -- Colon (age 44-75)  up to date -- Lung  up to date    Immunizations Immunization History  Administered Date(s) Administered   Hepatitis A 06/17/1967   Hepatitis B 15[birth year]Sep[birth year]1966   Influenza, Seasonal, Injecte, Preservative Fre 07/22/2012, 07/20/2015, 06/22/2016, 08/23/2018   Influenza,inj,Quad PF,6+ Mos 05/24/2020, 06/01/2022   Influenza,trivalent, recombinat, inj, PF 08/28/2013, 07/02/2014   Influenza-Unspecified 07/22/2012   MMR 06/17/1967   Moderna Sars-Covid-2 Vaccination 11/27/2019, 01/02/2020, 08/21/2020   Pneumococcal Polysaccharide-23 08/25/2012   Pneumococcal-Unspecified 09/25/2003, 08/25/2012   Td 06/29/2006, 12/23/2016   Tdap 06/29/2006, 12/23/2016   Unspecified SARS-COV-2 Vaccination 01/17/2021, 07/21/2021   Varicella 06/17/1979   Zoster Recombinat (Shingrix) 07/03/2021, 10/16/2021   Zoster, Live 06/16/2016    -- flu vaccine up to date -- TDAP q10 years up to date -- Shingles (age >26) up to date -- PPSV-23 (19-64 with chronic disease or smoking) up to date  -- Covid-19 Vaccine up to date  Encouraged regular vision and dental screening. Encouraged healthy exercise and diet.   Kevin Huerta  Assessment: 56 y.o. here for routine annual physical examination.  Plan: Problem List Items Addressed This Visit       Nervous and Auditory   Chronic bilateral low back pain with right-sided sciatica    S/p steroid injections w/o improvement. Advised surgery consult - referral placed      Relevant Orders   Ambulatory referral to Neurosurgery   Other Visit Diagnoses     Annual physical exam    -  Primary   Screening for prostate cancer       Relevant Orders   PSA       Screening: -- Blood pressure screen normal -- cholesterol screening:  reviewed outside labs, stable -- Weight screening: overweight: continue to monitor -- Diabetes Screening:  sees endo -- Nutrition: normal - Encouraged healthy diet  The 10-year ASCVD risk score (Arnett DK, et al., 2019) is: 13.5%   Values used to calculate the score:     Age: 83 years     Sex: Male     Is Non-Hispanic African American: Yes     Diabetic: Yes     Tobacco  smoker: No     Systolic Blood Pressure: 846 mmHg     Is BP treated: Yes     HDL Cholesterol: 42.1 mg/dL     Total Cholesterol: 136 mg/dL  -- ASA 81 mg discussed if CVD risk >10% age 34-59 and willing to take for 10 years -- Statin therapy for Age 27-75 with CVD risk >7.5%  Psych -- Depression screening (PHQ-9): negative  Safety -- tobacco screening: not using -- alcohol screening: low-risk usage. -- no evidence of domestic violence or intimate partner violence.  Cancer Screening -- Prostate (age 28-69) ordered -- Colon (age 44-75)  up to date -- Lung  up to date    Immunizations Immunization History  Administered Date(s) Administered   Hepatitis A 06/17/1967   Hepatitis B 15-Sep-1966   Influenza, Seasonal, Injecte, Preservative Fre 07/22/2012, 07/20/2015, 06/22/2016, 08/23/2018   Influenza,inj,Quad PF,6+ Mos 05/24/2020, 06/01/2022   Influenza,trivalent, recombinat, inj, PF 08/28/2013, 07/02/2014   Influenza-Unspecified 07/22/2012   MMR 06/17/1967   Moderna Sars-Covid-2 Vaccination 11/27/2019, 01/02/2020, 08/21/2020   Pneumococcal Polysaccharide-23 08/25/2012   Pneumococcal-Unspecified 09/25/2003, 08/25/2012   Td 06/29/2006, 12/23/2016   Tdap 06/29/2006, 12/23/2016   Unspecified SARS-COV-2 Vaccination 01/17/2021, 07/21/2021   Varicella 06/17/1979   Zoster Recombinat (Shingrix) 07/03/2021, 10/16/2021   Zoster, Live 06/16/2016    -- flu vaccine up to date -- TDAP q10 years up to date -- Shingles (age >26) up to date -- PPSV-23 (19-64 with chronic disease or smoking) up to date  -- Covid-19 Vaccine up to date  Encouraged regular vision and dental screening. Encouraged healthy exercise and diet.   Kevin Huerta  Assessment: 56 y.o. here for routine annual physical examination.  Plan: Problem List Items Addressed This Visit       Nervous and Auditory   Chronic bilateral low back pain with right-sided sciatica    S/p steroid injections w/o improvement. Advised surgery consult - referral placed      Relevant Orders   Ambulatory referral to Neurosurgery   Other Visit Diagnoses     Annual physical exam    -  Primary   Screening for prostate cancer       Relevant Orders   PSA       Screening: -- Blood pressure screen normal -- cholesterol screening:  reviewed outside labs, stable -- Weight screening: overweight: continue to monitor -- Diabetes Screening:  sees endo -- Nutrition: normal - Encouraged healthy diet  The 10-year ASCVD risk score (Arnett DK, et al., 2019) is: 13.5%   Values used to calculate the score:     Age: 83 years     Sex: Male     Is Non-Hispanic African American: Yes     Diabetic: Yes     Tobacco  smoker: No     Systolic Blood Pressure: 846 mmHg     Is BP treated: Yes     HDL Cholesterol: 42.1 mg/dL     Total Cholesterol: 136 mg/dL  -- ASA 81 mg discussed if CVD risk >10% age 34-59 and willing to take for 10 years -- Statin therapy for Age 27-75 with CVD risk >7.5%  Psych -- Depression screening (PHQ-9): negative  Safety -- tobacco screening: not using -- alcohol screening: low-risk usage. -- no evidence of domestic violence or intimate partner violence.  Cancer Screening -- Prostate (age 28-69) ordered -- Colon (age 44-75)  up to date -- Lung  up to date    Immunizations Immunization History  Administered Date(s) Administered   Hepatitis A 06/17/1967   Hepatitis B 15-Sep-1966   Influenza, Seasonal, Injecte, Preservative Fre 07/22/2012, 07/20/2015, 06/22/2016, 08/23/2018   Influenza,inj,Quad PF,6+ Mos 05/24/2020, 06/01/2022   Influenza,trivalent, recombinat, inj, PF 08/28/2013, 07/02/2014   Influenza-Unspecified 07/22/2012   MMR 06/17/1967   Moderna Sars-Covid-2 Vaccination 11/27/2019, 01/02/2020, 08/21/2020   Pneumococcal Polysaccharide-23 08/25/2012   Pneumococcal-Unspecified 09/25/2003, 08/25/2012   Td 06/29/2006, 12/23/2016   Tdap 06/29/2006, 12/23/2016   Unspecified SARS-COV-2 Vaccination 01/17/2021, 07/21/2021   Varicella 06/17/1979   Zoster Recombinat (Shingrix) 07/03/2021, 10/16/2021   Zoster, Live 06/16/2016    -- flu vaccine up to date -- TDAP q10 years up to date -- Shingles (age >26) up to date -- PPSV-23 (19-64 with chronic disease or smoking) up to date  -- Covid-19 Vaccine up to date  Encouraged regular vision and dental screening. Encouraged healthy exercise and diet.   Kevin Huerta  Assessment: 56 y.o. here for routine annual physical examination.  Plan: Problem List Items Addressed This Visit       Nervous and Auditory   Chronic bilateral low back pain with right-sided sciatica    S/p steroid injections w/o improvement. Advised surgery consult - referral placed      Relevant Orders   Ambulatory referral to Neurosurgery   Other Visit Diagnoses     Annual physical exam    -  Primary   Screening for prostate cancer       Relevant Orders   PSA       Screening: -- Blood pressure screen normal -- cholesterol screening:  reviewed outside labs, stable -- Weight screening: overweight: continue to monitor -- Diabetes Screening:  sees endo -- Nutrition: normal - Encouraged healthy diet  The 10-year ASCVD risk score (Arnett DK, et al., 2019) is: 13.5%   Values used to calculate the score:     Age: 83 years     Sex: Male     Is Non-Hispanic African American: Yes     Diabetic: Yes     Tobacco  smoker: No     Systolic Blood Pressure: 846 mmHg     Is BP treated: Yes     HDL Cholesterol: 42.1 mg/dL     Total Cholesterol: 136 mg/dL  -- ASA 81 mg discussed if CVD risk >10% age 34-59 and willing to take for 10 years -- Statin therapy for Age 27-75 with CVD risk >7.5%  Psych -- Depression screening (PHQ-9): negative  Safety -- tobacco screening: not using -- alcohol screening: low-risk usage. -- no evidence of domestic violence or intimate partner violence.  Cancer Screening -- Prostate (age 28-69) ordered -- Colon (age 44-75)  up to date -- Lung  up to date    Immunizations Immunization History  Administered Date(s) Administered   Hepatitis A 06/17/1967   Hepatitis B 15-Sep-1966   Influenza, Seasonal, Injecte, Preservative Fre 07/22/2012, 07/20/2015, 06/22/2016, 08/23/2018   Influenza,inj,Quad PF,6+ Mos 05/24/2020, 06/01/2022   Influenza,trivalent, recombinat, inj, PF 08/28/2013, 07/02/2014   Influenza-Unspecified 07/22/2012   MMR 06/17/1967   Moderna Sars-Covid-2 Vaccination 11/27/2019, 01/02/2020, 08/21/2020   Pneumococcal Polysaccharide-23 08/25/2012   Pneumococcal-Unspecified 09/25/2003, 08/25/2012   Td 06/29/2006, 12/23/2016   Tdap 06/29/2006, 12/23/2016   Unspecified SARS-COV-2 Vaccination 01/17/2021, 07/21/2021   Varicella 06/17/1979   Zoster Recombinat (Shingrix) 07/03/2021, 10/16/2021   Zoster, Live 06/16/2016    -- flu vaccine up to date -- TDAP q10 years up to date -- Shingles (age >26) up to date -- PPSV-23 (19-64 with chronic disease or smoking) up to date  -- Covid-19 Vaccine up to date  Encouraged regular vision and dental screening. Encouraged healthy exercise and diet.   Kevin Huerta

## 2022-06-15 NOTE — Assessment & Plan Note (Signed)
S/p steroid injections w/o improvement. Advised surgery consult - referral placed

## 2022-06-15 NOTE — Progress Notes (Signed)
This patient returns to my office for at risk foot care.  This patient requires this care by a professional since this patient will be at risk due to having diabetes.  This patient is unable to cut nails himself since the patient cannot reach his nails.These nails are painful walking and wearing shoes.   This patient presents for at risk foot care today.  General Appearance  Alert, conversant and in no acute stress.  Vascular  Dorsalis pedis and posterior tibial  pulses are palpable  bilaterally.  Capillary return is within normal limits  bilaterally. Temperature is within normal limits  bilaterally.  Neurologic  Senn-Weinstein monofilament wire test within normal limits  bilaterally. Muscle power within normal limits bilaterally.  Nails Thick disfigured discolored nails with subungual debris  from hallux to fifth toes bilaterally. No evidence of bacterial infection or drainage bilaterally.  Orthopedic  No limitations of motion  feet .  No crepitus or effusions noted.  No digital deformities noted.  HAV  B/L.  Skin  normotropic skin with no porokeratosis noted bilaterally.  No signs of infections or ulcers noted.     Onychomycosis  Pain in right toes  Pain in left toes  Consent was obtained for treatment procedures.   Mechanical debridement of nails 1-5  bilaterally performed with a nail nipper.  Filed with dremel without incident.    Return office visit   4 months                   Told patient to return for periodic foot care and evaluation due to potential at risk complications.     Earnesteen Birnie DPM  

## 2022-06-17 ENCOUNTER — Telehealth: Payer: Self-pay | Admitting: *Deleted

## 2022-06-17 ENCOUNTER — Telehealth (INDEPENDENT_AMBULATORY_CARE_PROVIDER_SITE_OTHER): Payer: 59 | Admitting: Family Medicine

## 2022-06-17 ENCOUNTER — Encounter: Payer: Self-pay | Admitting: Family Medicine

## 2022-06-17 VITALS — Wt 221.0 lb

## 2022-06-17 DIAGNOSIS — U071 COVID-19: Secondary | ICD-10-CM | POA: Diagnosis not present

## 2022-06-17 DIAGNOSIS — J4520 Mild intermittent asthma, uncomplicated: Secondary | ICD-10-CM

## 2022-06-17 MED ORDER — MOLNUPIRAVIR EUA 200MG CAPSULE: 4.0000 | ORAL_CAPSULE | Freq: Two times a day (BID) | ORAL | 0 refills | Status: AC

## 2022-06-17 MED ORDER — PROMETHAZINE-DM 6.25-15 MG/5ML PO SYRP: 5.0000 mL | ORAL_SOLUTION | Freq: Four times a day (QID) | ORAL | 0 refills | Status: DC | PRN

## 2022-06-17 NOTE — Patient Instructions (Signed)
Isolate Today - 9/16 Mask 9/17-9/21 Regular activity 06/26/2022

## 2022-06-17 NOTE — Assessment & Plan Note (Signed)
Cont inhaler prn. If wheezing/cough persisting update and will consider short course of steroids.

## 2022-06-17 NOTE — Telephone Encounter (Signed)
After speaking with Dr. Selena Batten I called and spoke to patient's wife. Patient's wife stated that she is out picking her husband's medication up now. Mrs. Yeatts was advised if her husband's oxygen level stays consistently below 90 he would need to go to the ER to be evaluated. Patient's wife was advised that she should continue to monitor is oxygen level closely. Patient's wife stated that he did say he is having more difficulty breathing today than before. Patient's wife stated that she will check his oxygen level as soon as she gets home and will send a mychart message to Dr. Selena Batten.

## 2022-06-17 NOTE — Telephone Encounter (Signed)
Noted, routing to [place] to be on the look for mychart message if I am out. Agree with plan if persistently <90% or trouble breathing would recommend ER evaluation

## 2022-06-17 NOTE — Telephone Encounter (Signed)
Also Mike's oxygen level is between 89 and 90.  Is there any point where we should go to the ER?  Like if it goes down to 87 we should go to the ER?

## 2022-06-17 NOTE — Progress Notes (Signed)
I connected with Kevin Huerta on 06/17/22 at 10:00 AM EDT by video and verified that I am speaking with the correct person using two identifiers.   I discussed the limitations, risks, security and privacy concerns of performing an evaluation and management service by video and the availability of in person appointments. I also discussed with the patient that there may be a patient responsible charge related to this service. The patient expressed understanding and agreed to proceed.  Patient location: Home Provider Location: [facility] Participants: Lynnda Child and Kevin Huerta   Subjective:     Kevin Huerta is a 56 y.o. male presenting for Covid Positive (Onset of symptoms: Monday. Tested + yesterday. Burnard Bunting congestion, body aches, cough)     HPI  #Covid positive - 06/15/2022 - started not feeling well that night - not sleeping well - head congestion - Tuesday sore throat and cough - took a home covid positive - chills  - no fever - using inhaler with some help - every 2 hours - treatment: elderberry, oscillococcinum  Review of Systems  Constitutional:  Positive for chills. Negative for fever.  HENT:  Positive for congestion.   Respiratory:  Positive for cough and shortness of breath. Negative for wheezing.   Cardiovascular:  Negative for chest pain.  Gastrointestinal:  Negative for diarrhea, nausea and vomiting.  Musculoskeletal:  Positive for myalgias. Negative for arthralgias.     Social History   Tobacco Use  Smoking Status Former   Packs/day: 1.50   Years: 32.00   Total pack years: 48.00   Types: Cigarettes   Quit date: 06/11/2018   Years since quitting: 4.0  Smokeless Tobacco Former   Types: Snuff   Quit date: 1996        Objective:   BP Readings from Last 3 Encounters:  06/15/22 (!) 106/58  06/01/22 132/70  05/07/22 122/78   Wt Readings from Last 3 Encounters:  06/17/22 221 lb (100.2 kg)  06/15/22 221 lb 6 oz (100.4 kg)   06/01/22 225 lb (102.1 kg)    Wt 221 lb (100.2 kg)   BMI 33.11 kg/m   Physical Exam Constitutional:      Appearance: Normal appearance. He is not ill-appearing.  HENT:     Head: Normocephalic and atraumatic.     Right Ear: External ear normal.     Left Ear: External ear normal.  Eyes:     Conjunctiva/sclera: Conjunctivae normal.  Pulmonary:     Effort: Pulmonary effort is normal. No respiratory distress.  Neurological:     Mental Status: He is alert. Mental status is at baseline.  Psychiatric:        Mood and Affect: Mood normal.        Behavior: Behavior normal.        Thought Content: Thought content normal.        Judgment: Judgment normal.            Assessment & Plan:   Problem List Items Addressed This Visit       Respiratory   Asthma    Cont inhaler prn. If wheezing/cough persisting update and will consider short course of steroids.       Other Visit Diagnoses     COVID-19 virus infection    -  Primary   Relevant Medications   promethazine-dextromethorphan (PROMETHAZINE-DM) 6.25-15 MG/5ML syrup   molnupiravir EUA (LAGEVRIO) 200 mg CAPS capsule      Patient is at increased risk for developing severe  covid due to CAD, HTN, DM, depression. They are eligible for anti-viral medication.   Discussed molnupiravir due to drug interactions with paxlovid and they would like to start. Normal liver and kidney function.   Lab Results  Component Value Date   ALT 27 02/26/2021   AST 18 02/26/2021   ALKPHOS 71 02/26/2021   BILITOT 0.3 02/26/2021    Lab Results  Component Value Date   CREATININE 1.03 02/26/2021     Medication sent to pharmacy  Reviewed ER and return precautions  Reviewed isolation guidelines.    Return if symptoms worsen or fail to improve.  Lynnda Child, MD

## 2022-06-18 ENCOUNTER — Encounter: Payer: Self-pay | Admitting: Family Medicine

## 2022-06-18 DIAGNOSIS — R110 Nausea: Secondary | ICD-10-CM

## 2022-06-18 MED ORDER — ONDANSETRON HCL 4 MG PO TABS: 4.0000 mg | ORAL_TABLET | Freq: Three times a day (TID) | ORAL | 0 refills | Status: DC | PRN

## 2022-06-19 NOTE — Telephone Encounter (Signed)
Mychart messages between pt's wife and Dr. Selena Batten have taken place in regards to this.

## 2022-06-26 ENCOUNTER — Ambulatory Visit
Admission: RE | Admit: 2022-06-26 | Discharge: 2022-06-26 | Disposition: A | Discharge status: Home / Self Care | Payer: 59 | Source: Ambulatory Visit | Attending: Family | Admitting: Family

## 2022-06-26 ENCOUNTER — Encounter: Payer: Self-pay | Admitting: Family

## 2022-06-26 ENCOUNTER — Telehealth (INDEPENDENT_AMBULATORY_CARE_PROVIDER_SITE_OTHER): Payer: 59 | Admitting: Family

## 2022-06-26 VITALS — BP 136/78 | Temp 99.1°F

## 2022-06-26 DIAGNOSIS — J40 Bronchitis, not specified as acute or chronic: Secondary | ICD-10-CM | POA: Insufficient documentation

## 2022-06-26 MED ORDER — BENZONATATE 100 MG PO CAPS: 100.0000 mg | ORAL_CAPSULE | Freq: Three times a day (TID) | ORAL | 1 refills | Status: DC | PRN

## 2022-06-26 NOTE — Patient Instructions (Addendum)
For continued cough and congestion, I think this is likely residual COVID.     I have refilled Tessalon perles  Use albuterol every 6 hours for first 24 hours to get good medication into the lungs and loosen congestion; after, you may use as needed and eventually stop all together when cough resolves.  Start and remain consistent with mucinex 1200mg  twice daily with plenty of water.   I have ordered chest xray to evaluate for pneumonia for you for you to have done at [facility].  Please go over the weekend.    If you develop shortness of breath, fever or generally not feeling well over the weekend, please go to the emergency room to be evaluated in person

## 2022-06-26 NOTE — Progress Notes (Signed)
Virtual Visit via Video Note  I connected with@  on 06/26/22 at  4:00 PM EDT by a video enabled telemedicine application and verified that I am speaking with the correct person using two identifiers.  Location patient: home Location provider:work  Persons participating in the virtual visit: patient, provider  I discussed the limitations of evaluation and management by telemedicine and the availability of in person appointments. The patient expressed understanding and agreed to proceed.   HPI: Accompanied by wife on visit   complains of dry cough x 10 days ago, improved.   He is working again this week and energy has improved.  He continues to have nasal congestion which started a couple days ago.  Endorses chest tightness however this ihas 'greatly' improved since last week.   He had been using albuterol last week.  He took 1 dose of Mucinex today.  He completed tessalon perles. He has not used the cough syrup.   Endorses PND   No sob, CP, chills.   He had follow-up with endocrine as well as physiatry.   Previously seen 9/13  by Dr. Einar Pheasant for covid 19 He was prescribed molnupiravir  History of coronary artery disease, asthma, OSA, diabetes Former smoker, quit smoking 2019  ROS: See pertinent positives and negatives per HPI.    EXAM:  VITALS per patient if applicable: BP 578/46   Temp 99.1 F (37.3 C)  BP Readings from Last 3 Encounters:  06/26/22 136/78  06/15/22 (!) 106/58  06/01/22 132/70   Wt Readings from Last 3 Encounters:  06/17/22 221 lb (100.2 kg)  06/15/22 221 lb 6 oz (100.4 kg)  06/01/22 225 lb (102.1 kg)    GENERAL: alert, oriented, appears well and in no acute distress  HEENT: atraumatic, conjunttiva clear, no obvious abnormalities on inspection of external nose and ears  NECK: normal movements of the head and neck  LUNGS: on inspection no signs of respiratory distress, breathing rate appears normal, no obvious gross SOB, gasping or  wheezing  CV: no obvious cyanosis  MS: moves all visible extremities without noticeable abnormality  PSYCH/NEURO: pleasant and cooperative, no obvious depression or anxiety, speech and thought processing grossly intact  ASSESSMENT AND PLAN:  Discussed the following assessment and plan:  Problem List Items Addressed This Visit       Respiratory   Bronchitis - Primary    Afebrile.  No acute respiratory distress.  Recent COVID-19 infection and advised most likely sequela of viral URI . advised him to be diligent with using Mucinex 600 mg twice daily with plenty of water, to schedule albuterol inhaler and to resume [employer] as needed.  Pending chest x-ray to evaluate for COVID associated pneumonia.  Advised over the weekend certainly if he feels worse, develops shortness of breath for him to go be evaluated in person at urgent care or ED.       Relevant Medications   benzonatate (TESSALON) 100 MG capsule   Other Relevant Orders   DG Chest 2 View    -we discussed possible serious and likely etiologies, options for evaluation and workup, limitations of telemedicine visit vs in person visit, treatment, treatment risks and precautions. Pt prefers to treat via telemedicine empirically rather then risking or undertaking an in person visit at this moment.  .   I discussed the assessment and treatment plan with the patient. The patient was provided an opportunity to ask questions and all were answered. The patient agreed with the plan and demonstrated an understanding  of the instructions.   The patient was advised to call back or seek an in-person evaluation if the symptoms worsen or if the condition fails to improve as anticipated.   Mable Paris, FNP

## 2022-06-26 NOTE — Assessment & Plan Note (Signed)
Afebrile.  No acute respiratory distress.  Recent COVID-19 infection and advised most likely sequela of viral URI . advised him to be diligent with using Mucinex 600 mg twice daily with plenty of water, to schedule albuterol inhaler and to resume [employer] as needed.  Pending chest x-ray to evaluate for COVID associated pneumonia.  Advised over the weekend certainly if he feels worse, develops shortness of breath for him to go be evaluated in person at urgent care or ED.

## 2022-06-29 ENCOUNTER — Encounter: Payer: Self-pay | Admitting: Family

## 2022-06-29 DIAGNOSIS — J40 Bronchitis, not specified as acute or chronic: Secondary | ICD-10-CM

## 2022-06-29 MED ORDER — PREDNISONE 10 MG PO TABS: ORAL_TABLET | ORAL | 0 refills | Status: DC

## 2022-06-29 NOTE — Telephone Encounter (Signed)
I called patient to discuss MyChart message and sending in prednisone.  I have sent him a detailed MyChart message.  If he calls back, Please review my message and let me know if any questions at all

## 2022-07-06 LAB — HEPATIC FUNCTION PANEL
ALT: 4 U/L — AB (ref 10–40)
AST: 21 (ref 14–40)

## 2022-07-13 ENCOUNTER — Other Ambulatory Visit: Payer: Self-pay

## 2022-07-13 ENCOUNTER — Ambulatory Visit
Admission: RE | Admit: 2022-07-13 | Discharge: 2022-07-13 | Disposition: A | Discharge status: Home / Self Care | Payer: Self-pay | Source: Ambulatory Visit | Attending: Neurosurgery | Admitting: Neurosurgery

## 2022-07-13 DIAGNOSIS — Z049 Encounter for examination and observation for unspecified reason: Secondary | ICD-10-CM

## 2022-07-14 ENCOUNTER — Ambulatory Visit: Payer: 59 | Admitting: Neurosurgery

## 2022-07-20 NOTE — Progress Notes (Deleted)
Referring Physician:  Waunita Schooner, MD [address] [place],  [place] [address]  Primary Physician:  Kevin Leitz, MD  History of Present Illness: 07/20/2022 [name] is here today with a chief complaint of *** right-sided back pain with radiating pain to the right lateral thigh and calf extending into the ankle   Duration: ***2022 Location: *** Quality: ***dull, aching, burning, tingling, weakness Severity: *** 10/10 Precipitating: aggravated by ***walking for more than 15 minutes.  Modifying factors: made better by ***sitting Weakness: none Timing: ***constant Bowel/Bladder Dysfunction: none  Conservative measures:  Physical therapy: *** has participated in at [facility]. Discharged?? Multimodal medical therapy including regular antiinflammatories: *** advil, gabapentin, prednisone Injections: *** has received epidural steroid injections 05/29/2022: Right L5-S1 and right S1 transforaminal ESI (60% relief, dexamethasone 12 mg)  04/10/2022: Right L5-S1 and right S1 transforaminal ESI (50% relief dexamethasone 10 mg blood sugar stayed manageable)  Past Surgery: *** Neck Surgery in *** by Dr.***  Kevin Huerta has ***no symptoms of cervical myelopathy.  The symptoms are causing a significant impact on the patient's life.   Review of Systems:  A 10 point review of systems is negative, except for the pertinent positives and negatives detailed in the HPI.  Past Medical History: Past Medical History:  Diagnosis Date   Asthma    BRBPR (bright red blood per rectum) 05/24/2020   Dermatitis 06/02/2021   Diabetes mellitus without complication ([place])    Diarrhea 02/26/2021   Fatty liver 09/03/2020   Heart murmur    Hyperlipidemia    Hypersomnia 07/17/2014   Hypertension    Leg cramps 02/26/2021    Past Surgical History: Past Surgical History:  Procedure Laterality Date   CERVICAL DISC SURGERY     fusion   KNEE SURGERY Right    ROTATOR CUFF REPAIR      WISDOM TOOTH EXTRACTION      Allergies: Allergies as of 07/21/2022   (No Known Allergies)    Medications: No outpatient medications have been marked as taking for the 07/21/22 encounter (Appointment) with Kevin Maw, MD.    Social History: Social History   Tobacco Use   Smoking status: Former    Packs/day: 1.50    Years: 32.00    Total pack years: 48.00    Types: Cigarettes    Quit date: 06/11/2018    Years since quitting: 4.1   Smokeless tobacco: Former    Types: Snuff    Quit date: 1996  Vaping Use   Vaping Use: Some days   Devices: x3 per day to quit smoking nicotine   Substance Use Topics   Alcohol use: Yes    Comment: cut back, 6 pack per month   Drug use: Not Currently    Types: Marijuana    Comment: a few times a year    Family Medical History: Family History  Problem Relation Age of Onset   Heart attack Father 95   Alcohol abuse Father    Drug abuse Father    Heart disease Father    Lung cancer Father    Hypertension Mother    Heart attack Maternal Grandmother 74   Stroke Maternal Aunt 65    Physical Examination: There were no vitals filed for this visit.  General: Patient is well developed, well nourished, calm, collected, and in no apparent distress. Attention to examination is appropriate.  Neck:   Supple.  Full range of motion.  Respiratory: Patient is breathing without any difficulty.   NEUROLOGICAL:  Awake, alert, oriented to person, place, and time.  Speech is clear and fluent. Fund of knowledge is appropriate.   Cranial Nerves: Pupils equal round and reactive to light.  Facial tone is symmetric.  Facial sensation is symmetric. Shoulder shrug is symmetric. Tongue protrusion is midline.  There is no pronator drift.  ROM of spine: full.    Strength: Side Biceps Triceps Deltoid Interossei Grip Wrist Ext. Wrist Flex.  R 5 5 5 5 5 5 5   L 5 5 5 5 5 5 5    Side Iliopsoas Quads Hamstring PF DF EHL  R 5 5 5 5 5 5   L 5 5 5 5 5 5     Reflexes are ***2+ and symmetric at the biceps, triceps, brachioradialis, patella and achilles.   Hoffman's is absent.   Bilateral upper and lower extremity sensation is intact to light touch.    No evidence of dysmetria noted.  Gait is normal.     Medical Decision Making  Imaging: ***  I have personally reviewed the images and agree with the above interpretation.  Assessment and Plan: [name] is a pleasant 56 y.o. male with ***   I spent a total of *** minutes in face-to-face and non-face-to-face activities related to this patient's care today.  Thank you for involving me in the care of this patient.      Kevin K. MD, [facility] Neurosurgery

## 2022-07-21 ENCOUNTER — Ambulatory Visit: Payer: 59 | Admitting: Neurosurgery

## 2022-07-23 ENCOUNTER — Telehealth: Payer: Self-pay | Admitting: Family Medicine

## 2022-07-23 NOTE — Telephone Encounter (Signed)
Pt need a refill on lisinopril [facility]

## 2022-07-23 NOTE — Telephone Encounter (Signed)
Is it okay to refill the Lisinopril for a 90 day supply? Last office visit 06/01/22 and he does not have another visit scheduled with [place]. Waunita Schooner sent last Lisinopril prescription in on 10/22 of 90 days with 3 refills.

## 2022-07-24 ENCOUNTER — Other Ambulatory Visit: Payer: Self-pay

## 2022-07-24 DIAGNOSIS — E1159 Type 2 diabetes mellitus with other circulatory complications: Secondary | ICD-10-CM

## 2022-07-27 ENCOUNTER — Other Ambulatory Visit: Payer: Self-pay

## 2022-07-27 MED ORDER — VALSARTAN 160 MG PO TABS: 160.0000 mg | ORAL_TABLET | Freq: Every day | ORAL | 0 refills | Status: DC

## 2022-07-27 NOTE — Telephone Encounter (Signed)
Patient stated that you knew he was having his Physical with Jessica Cody then he got sick and she had an opening. Patient states that you are his PCP. 

## 2022-07-27 NOTE — Telephone Encounter (Signed)
Patient stated that you knew he was having his Physical with Waunita Schooner then he got sick and she had an opening. Patient states that you are his PCP.

## 2022-07-27 NOTE — Progress Notes (Signed)
Prescription sent to pharmacy.

## 2022-07-27 NOTE — Telephone Encounter (Signed)
Spoke to Patient regarding changing the Lisinopril to Valsartan 160 mg daily and to get a BP check with the nurse in 4 weeks per Dr. Volanda Napoleon. Patient stated he needs to call back to make the appointment with the nurse due to not having his calendar right now. Patient verbalized understanding and is agreeable.

## 2022-08-05 NOTE — Progress Notes (Unsigned)
Referring Physician:  Waunita Schooner, MD [address] [place],  [place] [address]  Primary Physician:  Carollee Leitz, MD  History of Present Illness: 08/06/2022 [name] is here today with a chief complaint of  right-sided back pain with radiating pain to the right lateral thigh and calf extending into the ankle. The leg pain is more bothersome than his back.  He has been having this pain for approximately 1 year but is been worsening over that time.  He has tried physical therapy and injections without improvement.  Walking more than 15 minutes or moving makes his pain worse.  It reproducibly occurs in his right calf extending up to his right buttock.  He gets some burning, aching, tingling, and weakness.  Sitting down makes it better.   Bowel/Bladder Dysfunction: none  Conservative measures:  Physical therapy:  has participated in at [facility] for 7 weeks without relief and was discharged Multimodal medical therapy including regular antiinflammatories:  gabapentin, advil Injections: has had epidural steroid injections 05/29/2022: Right L5-S1 and right S1 transforaminal ESI (60% relief, dexamethasone 12 mg)  04/10/2022: Right L5-S1 and right S1 transforaminal ESI (50% relief dexamethasone 10 mg blood sugar stayed manageable)  Past Surgery: Cervical Fusion about 14 years ago   Jodie Leiner has no symptoms of cervical myelopathy.  The symptoms are causing a significant impact on the patient's life.   Review of Systems:  A 10 point review of systems is negative, except for the pertinent positives and negatives detailed in the HPI.  Past Medical History: Past Medical History:  Diagnosis Date   Asthma    BRBPR (bright red blood per rectum) 05/24/2020   Dermatitis 06/02/2021   Diabetes mellitus without complication ([place])    Diarrhea 02/26/2021   Fatty liver 09/03/2020   Heart murmur    Hyperlipidemia    Hypersomnia 07/17/2014   Hypertension    Leg cramps  02/26/2021    Past Surgical History: Past Surgical History:  Procedure Laterality Date   CERVICAL DISC SURGERY     fusion   KNEE SURGERY Right    ROTATOR CUFF REPAIR     WISDOM TOOTH EXTRACTION      Allergies: Allergies as of 08/06/2022   (No Known Allergies)    Medications: Current Meds  Medication Sig   allopurinol (ZYLOPRIM) 100 MG tablet Take 200 mg by mouth daily.   ALPRAZolam (XANAX) 0.5 MG tablet Take 0.5 mg by mouth as needed.   atorvastatin (LIPITOR) 80 MG tablet TAKE 1 TABLET DAILY   benzonatate (TESSALON) 100 MG capsule Take 1 capsule (100 mg total) by mouth 3 (three) times daily as needed for cough.   Blood Glucose Monitoring Suppl (ONETOUCH VERIO FLEX SYSTEM) w/Device KIT by Does not apply route.   buPROPion (WELLBUTRIN XL) 150 MG 24 hr tablet Take 150 mg by mouth daily.   cetirizine (ZYRTEC) 10 MG tablet Take 10 mg by mouth daily.   clotrimazole-betamethasone (LOTRISONE) cream Apply 1 Application topically daily.   colchicine 0.6 MG tablet Take 0.6 mg by mouth as needed.   Continuous Blood Gluc Sensor (FREESTYLE LIBRE 2 SENSOR) MISC USE TO CHECK GLUCOSE THREE TIMES A DAY   gabapentin (NEURONTIN) 300 MG capsule Take 300 mg by mouth 2 (two) times daily.   glipiZIDE (GLUCOTROL XL) 10 MG 24 hr tablet Take 10 mg by mouth.   ipratropium (ATROVENT) 0.06 % nasal spray Place 2 sprays into both nostrils 4 (four) times daily.   ketoconazole (NIZORAL) 2 %  cream APPLY TOPICALLY TWICE A DAY AS NEEDED   Melatonin 10 MG CAPS Take 10 mg by mouth every evening.   Multiple Vitamin (MULTI-VITAMIN) tablet Take by mouth.   omeprazole (PRILOSEC) 40 MG capsule Take 1 capsule (40 mg total) by mouth in the morning and at bedtime.   ondansetron (ZOFRAN) 4 MG tablet Take 1 tablet (4 mg total) by mouth every 8 (eight) hours as needed for nausea or vomiting.   OneTouch Delica Lancets 03Y MISC by Does not apply route.   ONETOUCH VERIO test strip    PROAIR HFA 108 (90 Base) MCG/ACT inhaler  Inhale 1-2 puffs into the lungs every 6 (six) hours as needed for wheezing or shortness of breath.   Semaglutide, 2 MG/DOSE, (OZEMPIC, 2 MG/DOSE,) 8 MG/3ML SOPN Inject into the skin.   SYNJARDY XR 12.02-999 MG TB24 Take by mouth.   traZODone (DESYREL) 100 MG tablet Take 100 mg by mouth at bedtime.   valsartan (DIOVAN) 160 MG tablet Take 1 tablet (160 mg total) by mouth daily.   [DISCONTINUED] predniSONE (DELTASONE) 10 MG tablet Take 4 tablets ( total 40 mg) by mouth for 2 days; take 3 tablets ( total 30 mg) by mouth for 2 days; take 2 tablets ( total 20 mg) by mouth for 1 day; take 1 tablet ( total 10 mg) by mouth for 1 day.    Social History: Social History   Tobacco Use   Smoking status: Former    Packs/day: 1.50    Years: 32.00    Total pack years: 48.00    Types: Cigarettes    Quit date: 06/11/2018    Years since quitting: 4.1   Smokeless tobacco: Former    Types: Snuff    Quit date: 1996  Vaping Use   Vaping Use: Some days   Devices: x3 per day to quit smoking nicotine   Substance Use Topics   Alcohol use: Yes    Comment: cut back, 6 pack per month   Drug use: Not Currently    Types: Marijuana    Comment: a few times a year    Family Medical History: Family History  Problem Relation Age of Onset   Heart attack Father 51   Alcohol abuse Father    Drug abuse Father    Heart disease Father    Lung cancer Father    Hypertension Mother    Heart attack Maternal Grandmother 9   Stroke Maternal Aunt 43    Physical Examination: Vitals:   08/06/22 1511  BP: 122/78    General: Patient is well developed, well nourished, calm, collected, and in no apparent distress. Attention to examination is appropriate.  Neck:   Supple.  Full range of motion.  Respiratory: Patient is breathing without any difficulty.   NEUROLOGICAL:     Awake, alert, oriented to person, place, and time.  Speech is clear and fluent. Fund of knowledge is appropriate.   Cranial Nerves: Pupils  equal round and reactive to light.  Facial tone is symmetric.  Facial sensation is symmetric. Shoulder shrug is symmetric. Tongue protrusion is midline.  There is no pronator drift.  ROM of spine: full.    Strength: Side Biceps Triceps Deltoid Interossei Grip Wrist Ext. Wrist Flex.  R _0 L _1 Side Iliopsoas Quads Hamstring PF DF EHL  R _2 L _3 5  Reflexes are 1+ and symmetric at the biceps, triceps, brachioradialis, patella and achilles.   Hoffman's is absent.   Bilateral upper and lower extremity sensation is intact to light touch.    No evidence of dysmetria noted.  Gait is normal.     Medical Decision Making  Imaging: MRI L spine 03/13/22   T12-L1: No significant disc bulge. No neural foraminal stenosis. No central canal stenosis.   L1-L2: Mild broad-based disc bulge. No foraminal or central canal stenosis. Mild bilateral facet arthropathy.   L2-L3: Mild broad-based disc bulge. Moderate bilateral facet arthropathy with small bilateral facet effusions. No foraminal stenosis. Mild spinal stenosis.   L3-L4: Mild broad-based disc bulge. Mild bilateral facet arthropathy. No foraminal or central canal stenosis.   L4-L5: Mild broad-based disc bulge flattening the ventral thecal sac. Moderate bilateral facet arthropathy. Mild spinal stenosis. Mild right foraminal stenosis. No left foraminal stenosis.   L5-S1: Broad-based disc osteophyte complex. Moderate bilateral facet arthropathy. Prominence of the epidural fat deforming the thecal sac as can be seen with epidural lipomatosis. No spinal stenosis. Severe bilateral foraminal stenosis.   IMPRESSION: 1. At L5-S1 there is a broad-based disc osteophyte complex. Moderate bilateral facet arthropathy. Prominence of the epidural fat deforming the thecal sac as can be seen with epidural lipomatosis. No spinal stenosis. Severe bilateral foraminal stenosis. 2. At L4-5 there is a mild  broad-based disc bulge flattening the ventral thecal sac. Moderate bilateral facet arthropathy. Mild spinal stenosis. Mild right foraminal stenosis. 3. Mild spondylosis throughout the remainder of the lumbar spine as described above.     Electronically Signed   By: Kathreen Devoid M.D.   On: 03/15/2022 07:51  I have personally reviewed the images and agree with the above interpretation.  Assessment and Plan: Mr. Peterka is a pleasant 56 y.o. male with right sided low back pain with right L5 radiculopathy due to lateral recess compression at L4-5 and foraminal compression at L5-S1.  He has tried and failed conservative management.  No further conservative management is indicated.  To achieve full decompression of the right L5 nerve root, I have recommended right-sided L4-5 laminoforaminotomy as well as right-sided L5-S1 minimally invasive transforaminal lumbar interbody fusion.  I do not think I can achieve adequate foraminal decompression due to his loss of disc height as well as severe foraminal compression.  I think he needs reestablishment of normal disc height to achieve foraminal decompression of his nerve root.  I discussed the planned procedure at length with the patient, including the risks, benefits, alternatives, and indications. The risks discussed include but are not limited to bleeding, infection, need for reoperation, spinal fluid leak, stroke, vision loss, anesthetic complication, coma, paralysis, and even death. I also described in detail that improvement was not guaranteed.  The patient expressed understanding of these risks, and asked that we proceed with surgery. I described the surgery in layman's terms, and gave ample opportunity for questions, which were answered to the best of my ability.  He would like to talk this over with his wife and will let us know when he would like to choose a date.  I spent a total of 30 minutes in face-to-face and non-face-to-face activities  related to this patient's care today.  Thank you for involving me in the care of this patient.      Saliah Crisp K. Izora Ribas MD, [facility] Neurosurgery

## 2022-08-06 ENCOUNTER — Encounter: Payer: Self-pay | Admitting: Neurosurgery

## 2022-08-06 ENCOUNTER — Ambulatory Visit (INDEPENDENT_AMBULATORY_CARE_PROVIDER_SITE_OTHER): Payer: 59 | Admitting: Neurosurgery

## 2022-08-06 VITALS — BP 122/78 | Ht 68.5 in | Wt 226.0 lb

## 2022-08-06 DIAGNOSIS — M5416 Radiculopathy, lumbar region: Secondary | ICD-10-CM

## 2022-08-06 DIAGNOSIS — M5441 Lumbago with sciatica, right side: Secondary | ICD-10-CM

## 2022-08-06 DIAGNOSIS — G8929 Other chronic pain: Secondary | ICD-10-CM

## 2022-08-23 ENCOUNTER — Other Ambulatory Visit: Payer: Self-pay | Admitting: Family Medicine

## 2022-10-02 ENCOUNTER — Other Ambulatory Visit: Payer: Self-pay

## 2022-10-02 DIAGNOSIS — E1169 Type 2 diabetes mellitus with other specified complication: Secondary | ICD-10-CM

## 2022-10-02 DIAGNOSIS — E119 Type 2 diabetes mellitus without complications: Secondary | ICD-10-CM

## 2022-10-02 DIAGNOSIS — R110 Nausea: Secondary | ICD-10-CM

## 2022-10-02 NOTE — Progress Notes (Signed)
Atorvastatin pended for your review Omeprazole also pended for your review

## 2022-10-03 MED ORDER — ATORVASTATIN CALCIUM 80 MG PO TABS: 80.0000 mg | ORAL_TABLET | Freq: Every day | ORAL | 3 refills | Status: DC

## 2022-10-12 ENCOUNTER — Other Ambulatory Visit: Payer: Self-pay

## 2022-10-12 MED ORDER — OMEPRAZOLE 40 MG PO CPDR: 40.0000 mg | DELAYED_RELEASE_CAPSULE | Freq: Two times a day (BID) | ORAL | 1 refills | Status: DC

## 2022-10-12 NOTE — Telephone Encounter (Signed)
Pended order for Omeprazole 40 mg

## 2022-10-14 ENCOUNTER — Other Ambulatory Visit (INDEPENDENT_AMBULATORY_CARE_PROVIDER_SITE_OTHER): Payer: Self-pay | Admitting: Nurse Practitioner

## 2022-10-14 DIAGNOSIS — I739 Peripheral vascular disease, unspecified: Secondary | ICD-10-CM

## 2022-10-21 ENCOUNTER — Ambulatory Visit (INDEPENDENT_AMBULATORY_CARE_PROVIDER_SITE_OTHER): Payer: 59

## 2022-10-21 ENCOUNTER — Ambulatory Visit (INDEPENDENT_AMBULATORY_CARE_PROVIDER_SITE_OTHER): Payer: 59 | Admitting: Nurse Practitioner

## 2022-10-21 ENCOUNTER — Encounter (INDEPENDENT_AMBULATORY_CARE_PROVIDER_SITE_OTHER): Payer: Self-pay | Admitting: Nurse Practitioner

## 2022-10-21 VITALS — BP 148/80 | HR 74 | Ht 68.5 in | Wt 237.0 lb

## 2022-10-21 DIAGNOSIS — E1169 Type 2 diabetes mellitus with other specified complication: Secondary | ICD-10-CM

## 2022-10-21 DIAGNOSIS — E785 Hyperlipidemia, unspecified: Secondary | ICD-10-CM

## 2022-10-21 DIAGNOSIS — I152 Hypertension secondary to endocrine disorders: Secondary | ICD-10-CM

## 2022-10-21 DIAGNOSIS — E1159 Type 2 diabetes mellitus with other circulatory complications: Secondary | ICD-10-CM

## 2022-10-21 DIAGNOSIS — E119 Type 2 diabetes mellitus without complications: Secondary | ICD-10-CM | POA: Diagnosis not present

## 2022-10-21 DIAGNOSIS — I70213 Atherosclerosis of native arteries of extremities with intermittent claudication, bilateral legs: Secondary | ICD-10-CM

## 2022-10-21 NOTE — H&P (View-Only) (Signed)
Subjective:    Patient ID: Kevin Huerta, male    DOB: 14-Dec-1965, 57 y.o.   MRN: [id number] Chief Complaint  Patient presents with   New Patient (Initial Visit)    np. ABI + consult. possible claudication + diabetes type 2. referred by callwood, dwayne.      Kevin Huerta is a 57 year old male who presents today as a referral from Dr. [name] in regards to claudication like symptoms.  The patient notes that the symptoms have been ongoing for approximately 2 years.  He notes however that the claudication-like symptoms have been worsening recently.  He notes that he has also began to have issues with his left leg within the last 2 weeks or so.  He notes that the walking distance is variable but recently it seems that about 10 to 20 feet he begins to have symptoms.  It is primarily in his right leg in the calf area.  He has had injections in his back and it has he currently denies any rest pain like symptoms.  There are currently no open wounds or ulcerations.  Currently the patient does not smoke he stopped smoking approximately 4 years ago.  Today noninvasive studies show an ABI of 0.74 on the right and 0.86 on the left.  There are monophasic tibial artery waveforms on the right and biphasic tibial artery waveforms on the left.  Additional lower extremity arterial duplexes show a string sign flow in the distal SFA/popliteal artery with atherosclerotic disease process throughout the SFA.  The left duplex does show an occluded SFA with a widely patent artery    Review of Systems  Cardiovascular:        Claudication  All other systems reviewed and are negative.      Objective:   Physical Exam Vitals reviewed.  HENT:     Head: Normocephalic.  Skin:    General: Skin is warm and dry.  Neurological:     Mental Status: He is alert and oriented to person, place, and time.  Psychiatric:        Mood and Affect: Mood normal.        Behavior: Behavior normal.        Thought Content:  Thought content normal.        Judgment: Judgment normal.     BP (!) 148/80   Pulse 74   Ht 5' 8.5" (1.74 m)   Wt 237 lb (107.5 kg)   BMI 35.51 kg/m   Past Medical History:  Diagnosis Date   Asthma    BRBPR (bright red blood per rectum) 05/24/2020   Dermatitis 06/02/2021   Diabetes mellitus without complication (HCC)    Diarrhea 02/26/2021   Fatty liver 09/03/2020   Heart murmur    Hyperlipidemia    Hypersomnia 07/17/2014   Hypertension    Leg cramps 02/26/2021    Social History   Socioeconomic History   Marital status: Married    Spouse name: leslie   Number of children: 3   Years of education: Not on file   Highest education level: Not on file  Occupational History   Occupation: IT   Tobacco Use   Smoking status: Former    Packs/day: 1.50    Years: 32.00    Total pack years: 48.00    Types: Cigarettes    Quit date: 06/11/2018    Years since quitting: 4.3   Smokeless tobacco: Former    Types: Snuff    Quit date: 1996  Advertising account planner  Vaping Use: Some days   Devices: x3 per day to quit smoking nicotine   Substance and Sexual Activity   Alcohol use: Yes    Comment: cut back, 6 pack per month   Drug use: Not Currently    Types: Marijuana    Comment: a few times a year   Sexual activity: Yes    Partners: Female    Birth control/protection: None  Other Topics Concern   Not on file  Social History Narrative   10/15/20   From: [place] originally, but in the area since 2000   Living: with wife, Verlon Au (2003) and granddaughter   Work: Scientist, 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[employer]      Family: 3 daughters - Irish Lack, Toni Amend (just finished law school), Fonda Kinder, and granddaughter McKenzie (2011)      Enjoys: travel when able, beach, cycling      Exercise: exercise bike at home   Diet: does not follow diabetic diet, lower appetite with ozempic      Safety   Seat belts: Yes    Guns: Yes  and secure   Safe in relationships: Yes    Social Determinants of Health   Financial  Resource Strain: Low Risk  (10/07/2020)   Overall Financial Resource Strain (CARDIA)    Difficulty of Paying Living Expenses: Not hard at all  Food Insecurity: Not on file  Transportation Needs: Not on file  Physical Activity: Not on file  Stress: Not on file  Social Connections: Not on file  Intimate Partner Violence: Not on file    Past Surgical History:  Procedure Laterality Date   CERVICAL DISC SURGERY     fusion   KNEE SURGERY Right    ROTATOR CUFF REPAIR     WISDOM TOOTH EXTRACTION      Family History  Problem Relation Age of Onset   Heart attack Father 16   Alcohol abuse Father    Drug abuse Father    Heart disease Father    Lung cancer Father    Hypertension Mother    Heart attack Maternal Grandmother 39   Stroke Maternal Aunt 39    No Known Allergies     Latest Ref Rng & Units 02/26/2021   10:02 AM 09/03/2020    4:08 PM 06/17/2018    3:12 PM  CBC  WBC 4.0 - 10.5 K/uL 5.2  6.0  7.2   Hemoglobin 13.0 - 17.0 g/dL 60.6  30.1  60.1   Hematocrit 39.0 - 52.0 % 43.6  42.9  45.4   Platelets 150.0 - 400.0 K/uL 291.0  335.0  251       CMP     Component Value Date/Time   NA 140 02/26/2021 1002   K 4.7 02/26/2021 1002   CL 104 02/26/2021 1002   CO2 28 02/26/2021 1002   GLUCOSE 170 (H) 02/26/2021 1002   BUN 17 02/26/2021 1002   CREATININE 1.03 02/26/2021 1002   CALCIUM 10.3 02/26/2021 1002   PROT 7.3 02/26/2021 1002   ALBUMIN 4.6 02/26/2021 1002   AST 18 02/26/2021 1002   ALT 27 02/26/2021 1002   ALKPHOS 71 02/26/2021 1002   BILITOT 0.3 02/26/2021 1002   GFRNONAA >60 06/17/2018 1512   GFRAA >60 06/17/2018 1512     No results found.     Assessment & Plan:   1. Atherosclerosis of native artery of both lower extremities with intermittent claudication (HCC) I had a long discussion with the patient regarding peripheral arterial disease as well as  the causes of atherosclerosis.  Based upon the patient's symptoms as well as noninvasive studies I have  recommended that he undergo angiogram of the right lower extremity followed by the left.  We discussed that this typically cannot be done at the same time due to the concern for possible acute kidney injury due to the use of contrast dye.  We discussed the risk, procedures and benefits of angiogram.  At this time the patient wishes to go home and consider our discussion before moving forward with intervention.  I discussed with the patient that if he decides to move forward with the procedure to contact our office and we will be happy to get him scheduled.  However if he decides not to move forward he should still follow-up and we would reevaluate his lower extremities in 3 months with noninvasive studies.  We also discussed that if he decides not to move forward that he should be mindful about his symptoms as in some cases peripheral arterial disease can progress with rapidly deteriorating symptoms such as worsening claudication or rest pain.  The patient is instructed that if this occurs he should contact our office for immediate follow-up.  ADDENDUM: Patient has contacted our office to move forward with angiogram.    Recommend:  The patient has experienced increased claudication symptoms and is now describing lifestyle limiting claudication   Given the severity of the patient's severe right and left lower extremity symptoms the patient should undergo angiography with the hope for intervention.  Risk and benefits were reviewed the patient.  Indications for the procedure were reviewed.  All questions were answered, the patient agrees to proceed with right and left  lower extremity angiography and possible intervention.   The patient should continue walking and begin a more formal exercise program.  The patient should continue antiplatelet therapy and aggressive treatment of the lipid abnormalities  The patient will follow up with me after the angiogram.  - VAS [place] ABI WITH/WO TBI  2. Hypertension  associated with diabetes (HCC) Continue antihypertensive medications as already ordered, these medications have been reviewed and there are no changes at this time.  3. Type 2 diabetes mellitus without complication, without long-term current use of insulin (HCC) Continue hypoglycemic medications as already ordered, these medications have been reviewed and there are no changes at this time.  Hgb A1C to be monitored as already arranged by primary service  4. Hyperlipidemia associated with type 2 diabetes mellitus (HCC) Continue statin as ordered and reviewed, no changes at this time   Current Outpatient Medications on File Prior to Visit  Medication Sig Dispense Refill   allopurinol (ZYLOPRIM) 100 MG tablet Take 200 mg by mouth daily.     ALPRAZolam (XANAX) 0.5 MG tablet Take 0.5 mg by mouth as needed.     atorvastatin (LIPITOR) 80 MG tablet Take 1 tablet (80 mg total) by mouth daily. 90 tablet 3   Blood Glucose Monitoring Suppl (ONETOUCH VERIO FLEX SYSTEM) w/Device KIT by Does not apply route.     buPROPion (WELLBUTRIN XL) 150 MG 24 hr tablet Take 150 mg by mouth daily.     cetirizine (ZYRTEC) 10 MG tablet Take 10 mg by mouth daily.     clotrimazole-betamethasone (LOTRISONE) cream Apply 1 Application topically daily. 30 g 0   colchicine 0.6 MG tablet Take 0.6 mg by mouth as needed.     Continuous Blood Gluc Sensor (FREESTYLE LIBRE 2 SENSOR) MISC USE TO CHECK GLUCOSE THREE TIMES A DAY 2 each  12   gabapentin (NEURONTIN) 300 MG capsule Take 300 mg by mouth 2 (two) times daily.     glipiZIDE (GLUCOTROL XL) 10 MG 24 hr tablet Take 10 mg by mouth.     ipratropium (ATROVENT) 0.06 % nasal spray Place 2 sprays into both nostrils 4 (four) times daily. 15 mL 12   ketoconazole (NIZORAL) 2 % cream APPLY TOPICALLY TWICE A DAY AS NEEDED     Melatonin 10 MG CAPS Take 10 mg by mouth every evening.     Multiple Vitamin (MULTI-VITAMIN) tablet Take by mouth.     omeprazole (PRILOSEC) 40 MG capsule Take 1  capsule (40 mg total) by mouth in the morning and at bedtime. 180 capsule 1   ondansetron (ZOFRAN) 4 MG tablet Take 1 tablet (4 mg total) by mouth every 8 (eight) hours as needed for nausea or vomiting. 20 tablet 0   OneTouch Delica Lancets 33G MISC by Does not apply route.     ONETOUCH VERIO test strip      pioglitazone (ACTOS) 15 MG tablet Take 15 mg by mouth daily.     PROAIR HFA 108 (90 Base) MCG/ACT inhaler Inhale 1-2 puffs into the lungs every 6 (six) hours as needed for wheezing or shortness of breath. 18 g 1   Semaglutide, 2 MG/DOSE, (OZEMPIC, 2 MG/DOSE,) 8 MG/3ML SOPN Inject into the skin.     SYNJARDY XR 12.02-999 MG TB24 Take by mouth.     traZODone (DESYREL) 100 MG tablet Take 100 mg by mouth at bedtime.     valsartan (DIOVAN) 160 MG tablet TAKE 1 TABLET BY MOUTH EVERY DAY 90 tablet 1   No current facility-administered medications on file prior to visit.    There are no Patient Instructions on file for this visit. No follow-ups on file.   Georgiana Spinner, NP

## 2022-10-21 NOTE — Progress Notes (Addendum)
Subjective:    Patient ID: Kevin Huerta, male    DOB: 14-Dec-1965, 58 y.o.   MRN: [id number] Chief Complaint  Patient presents with   New Patient (Initial Visit)    np. ABI + consult. possible claudication + diabetes type 2. referred by Kevin Huerta.      Kevin Huerta is a 57 year old male who presents today as a referral from Dr. [name] in regards to claudication like symptoms.  The patient notes that the symptoms have been ongoing for approximately 2 years.  He notes however that the claudication-like symptoms have been worsening recently.  He notes that he has also began to have issues with his left leg within the last 2 weeks or so.  He notes that the walking distance is variable but recently it seems that about 10 to 20 feet he begins to have symptoms.  It is primarily in his right leg in the calf area.  He has had injections in his back and it has he currently denies any rest pain like symptoms.  There are currently no open wounds or ulcerations.  Currently the patient does not smoke he stopped smoking approximately 4 years ago.  Today noninvasive studies show an ABI of 0.74 on the right and 0.86 on the left.  There are monophasic tibial artery waveforms on the right and biphasic tibial artery waveforms on the left.  Additional lower extremity arterial duplexes show a string sign flow in the distal SFA/popliteal artery with atherosclerotic disease process throughout the SFA.  The left duplex does show an occluded SFA with a widely patent artery    Review of Systems  Cardiovascular:        Claudication  All other systems reviewed and are negative.      Objective:   Physical Exam Vitals reviewed.  HENT:     Head: Normocephalic.  Skin:    General: Skin is warm and dry.  Neurological:     Mental Status: He is alert and oriented to person, place, and time.  Psychiatric:        Mood and Affect: Mood normal.        Behavior: Behavior normal.        Thought Content:  Thought content normal.        Judgment: Judgment normal.     BP (!) 148/80   Pulse 74   Ht 5' 8.5" (1.74 m)   Wt 237 lb (107.5 kg)   BMI 35.51 kg/m   Past Medical History:  Diagnosis Date   Asthma    BRBPR (bright red blood per rectum) 05/24/2020   Dermatitis 06/02/2021   Diabetes mellitus without complication (HCC)    Diarrhea 02/26/2021   Fatty liver 09/03/2020   Heart murmur    Hyperlipidemia    Hypersomnia 07/17/2014   Hypertension    Leg cramps 02/26/2021    Social History   Socioeconomic History   Marital status: Married    Spouse name: leslie   Number of children: 3   Years of education: Not on file   Highest education level: Not on file  Occupational History   Occupation: IT   Tobacco Use   Smoking status: Former    Packs/day: 1.50    Years: 32.00    Total pack years: 48.00    Types: Cigarettes    Quit date: 06/11/2018    Years since quitting: 4.3   Smokeless tobacco: Former    Types: Snuff    Quit date: 1996  Advertising account planner  Vaping Use: Some days   Devices: x3 per day to quit smoking nicotine   Substance and Sexual Activity   Alcohol use: Yes    Comment: cut back, 6 pack per month   Drug use: Not Currently    Types: Marijuana    Comment: a few times a year   Sexual activity: Yes    Partners: Female    Birth control/protection: None  Other Topics Concern   Not on file  Social History Narrative   10/15/20   From: [place] originally, but in the area since 2000   Living: with wife, Kevin Huerta (2003) and granddaughter   Work: Scientist, 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[employer]      Family: 3 daughters - Kevin Huerta, Kevin Huerta (just finished law school), Kevin Huerta, and granddaughter Kevin Huerta (2011)      Enjoys: travel when able, beach, cycling      Exercise: exercise bike at home   Diet: does not follow diabetic diet, lower appetite with ozempic      Safety   Seat belts: Yes    Guns: Yes  and secure   Safe in relationships: Yes    Social Determinants of Health   Financial  Resource Strain: Low Risk  (10/07/2020)   Overall Financial Resource Strain (CARDIA)    Difficulty of Paying Living Expenses: Not hard at all  Food Insecurity: Not on file  Transportation Needs: Not on file  Physical Activity: Not on file  Stress: Not on file  Social Connections: Not on file  Intimate Partner Violence: Not on file    Past Surgical History:  Procedure Laterality Date   CERVICAL DISC SURGERY     fusion   KNEE SURGERY Right    ROTATOR CUFF REPAIR     WISDOM TOOTH EXTRACTION      Family History  Problem Relation Age of Onset   Heart attack Father 16   Alcohol abuse Father    Drug abuse Father    Heart disease Father    Lung cancer Father    Hypertension Mother    Heart attack Maternal Grandmother 39   Stroke Maternal Aunt 39    No Known Allergies     Latest Ref Rng & Units 02/26/2021   10:02 AM 09/03/2020    4:08 PM 06/17/2018    3:12 PM  CBC  WBC 4.0 - 10.5 K/uL 5.2  6.0  7.2   Hemoglobin 13.0 - 17.0 g/dL 60.6  30.1  60.1   Hematocrit 39.0 - 52.0 % 43.6  42.9  45.4   Platelets 150.0 - 400.0 K/uL 291.0  335.0  251       CMP     Component Value Date/Time   NA 140 02/26/2021 1002   K 4.7 02/26/2021 1002   CL 104 02/26/2021 1002   CO2 28 02/26/2021 1002   GLUCOSE 170 (H) 02/26/2021 1002   BUN 17 02/26/2021 1002   CREATININE 1.03 02/26/2021 1002   CALCIUM 10.3 02/26/2021 1002   PROT 7.3 02/26/2021 1002   ALBUMIN 4.6 02/26/2021 1002   AST 18 02/26/2021 1002   ALT 27 02/26/2021 1002   ALKPHOS 71 02/26/2021 1002   BILITOT 0.3 02/26/2021 1002   GFRNONAA >60 06/17/2018 1512   GFRAA >60 06/17/2018 1512     No results found.     Assessment & Plan:   1. Atherosclerosis of native artery of both lower extremities with intermittent claudication (HCC) I had a long discussion with the patient regarding peripheral arterial disease as well as  the causes of atherosclerosis.  Based upon the patient's symptoms as well as noninvasive studies I have  recommended that he undergo angiogram of the right lower extremity followed by the left.  We discussed that this typically cannot be done at the same time due to the concern for possible acute kidney injury due to the use of contrast dye.  We discussed the risk, procedures and benefits of angiogram.  At this time the patient wishes to go home and consider our discussion before moving forward with intervention.  I discussed with the patient that if he decides to move forward with the procedure to contact our office and we will be happy to get him scheduled.  However if he decides not to move forward he should still follow-up and we would reevaluate his lower extremities in 3 months with noninvasive studies.  We also discussed that if he decides not to move forward that he should be mindful about his symptoms as in some cases peripheral arterial disease can progress with rapidly deteriorating symptoms such as worsening claudication or rest pain.  The patient is instructed that if this occurs he should contact our office for immediate follow-up.  ADDENDUM: Patient has contacted our office to move forward with angiogram.    Recommend:  The patient has experienced increased claudication symptoms and is now describing lifestyle limiting claudication   Given the severity of the patient's severe right and left lower extremity symptoms the patient should undergo angiography with the hope for intervention.  Risk and benefits were reviewed the patient.  Indications for the procedure were reviewed.  All questions were answered, the patient agrees to proceed with right and left  lower extremity angiography and possible intervention.   The patient should continue walking and begin a more formal exercise program.  The patient should continue antiplatelet therapy and aggressive treatment of the lipid abnormalities  The patient will follow up with me after the angiogram.  - VAS [place] ABI WITH/WO TBI  2. Hypertension  associated with diabetes (HCC) Continue antihypertensive medications as already ordered, these medications have been reviewed and there are no changes at this time.  3. Type 2 diabetes mellitus without complication, without long-term current use of insulin (HCC) Continue hypoglycemic medications as already ordered, these medications have been reviewed and there are no changes at this time.  Hgb A1C to be monitored as already arranged by primary service  4. Hyperlipidemia associated with type 2 diabetes mellitus (HCC) Continue statin as ordered and reviewed, no changes at this time   Current Outpatient Medications on File Prior to Visit  Medication Sig Dispense Refill   allopurinol (ZYLOPRIM) 100 MG tablet Take 200 mg by mouth daily.     ALPRAZolam (XANAX) 0.5 MG tablet Take 0.5 mg by mouth as needed.     atorvastatin (LIPITOR) 80 MG tablet Take 1 tablet (80 mg total) by mouth daily. 90 tablet 3   Blood Glucose Monitoring Suppl (ONETOUCH VERIO FLEX SYSTEM) w/Device KIT by Does not apply route.     buPROPion (WELLBUTRIN XL) 150 MG 24 hr tablet Take 150 mg by mouth daily.     cetirizine (ZYRTEC) 10 MG tablet Take 10 mg by mouth daily.     clotrimazole-betamethasone (LOTRISONE) cream Apply 1 Application topically daily. 30 g 0   colchicine 0.6 MG tablet Take 0.6 mg by mouth as needed.     Continuous Blood Gluc Sensor (FREESTYLE LIBRE 2 SENSOR) MISC USE TO CHECK GLUCOSE THREE TIMES A DAY 2 each  12   gabapentin (NEURONTIN) 300 MG capsule Take 300 mg by mouth 2 (two) times daily.     glipiZIDE (GLUCOTROL XL) 10 MG 24 hr tablet Take 10 mg by mouth.     ipratropium (ATROVENT) 0.06 % nasal spray Place 2 sprays into both nostrils 4 (four) times daily. 15 mL 12   ketoconazole (NIZORAL) 2 % cream APPLY TOPICALLY TWICE A DAY AS NEEDED     Melatonin 10 MG CAPS Take 10 mg by mouth every evening.     Multiple Vitamin (MULTI-VITAMIN) tablet Take by mouth.     omeprazole (PRILOSEC) 40 MG capsule Take 1  capsule (40 mg total) by mouth in the morning and at bedtime. 180 capsule 1   ondansetron (ZOFRAN) 4 MG tablet Take 1 tablet (4 mg total) by mouth every 8 (eight) hours as needed for nausea or vomiting. 20 tablet 0   OneTouch Delica Lancets 33G MISC by Does not apply route.     ONETOUCH VERIO test strip      pioglitazone (ACTOS) 15 MG tablet Take 15 mg by mouth daily.     PROAIR HFA 108 (90 Base) MCG/ACT inhaler Inhale 1-2 puffs into the lungs every 6 (six) hours as needed for wheezing or shortness of breath. 18 g 1   Semaglutide, 2 MG/DOSE, (OZEMPIC, 2 MG/DOSE,) 8 MG/3ML SOPN Inject into the skin.     SYNJARDY XR 12.02-999 MG TB24 Take by mouth.     traZODone (DESYREL) 100 MG tablet Take 100 mg by mouth at bedtime.     valsartan (DIOVAN) 160 MG tablet TAKE 1 TABLET BY MOUTH EVERY DAY 90 tablet 1   No current facility-administered medications on file prior to visit.    There are no Patient Instructions on file for this visit. No follow-ups on file.   Georgiana Spinner, NP

## 2022-10-22 ENCOUNTER — Ambulatory Visit: Payer: 59 | Admitting: Podiatry

## 2022-10-22 ENCOUNTER — Encounter: Payer: Self-pay | Admitting: Podiatry

## 2022-10-22 DIAGNOSIS — E119 Type 2 diabetes mellitus without complications: Secondary | ICD-10-CM | POA: Diagnosis not present

## 2022-10-22 DIAGNOSIS — M79675 Pain in left toe(s): Secondary | ICD-10-CM

## 2022-10-22 DIAGNOSIS — B351 Tinea unguium: Secondary | ICD-10-CM | POA: Diagnosis not present

## 2022-10-22 DIAGNOSIS — M79674 Pain in right toe(s): Secondary | ICD-10-CM

## 2022-10-22 NOTE — Progress Notes (Signed)
This patient returns to my office for at risk foot care.  This patient requires this care by a professional since this patient will be at risk due to having diabetes.  This patient is unable to cut nails himself since the patient cannot reach his nails.These nails are painful walking and wearing shoes.   This patient presents for at risk foot care today.  General Appearance  Alert, conversant and in no acute stress.  Vascular  Dorsalis pedis and posterior tibial  pulses are palpable  bilaterally.  Capillary return is within normal limits  bilaterally. Temperature is within normal limits  bilaterally.  Neurologic  Senn-Weinstein monofilament wire test within normal limits  bilaterally. Muscle power within normal limits bilaterally.  Nails Thick disfigured discolored nails with subungual debris  from hallux to fifth toes bilaterally. No evidence of bacterial infection or drainage bilaterally.  Orthopedic  No limitations of motion  feet .  No crepitus or effusions noted.  No digital deformities noted.  HAV  B/L.  Skin  normotropic skin with no porokeratosis noted bilaterally.  No signs of infections or ulcers noted.     Onychomycosis  Pain in right toes  Pain in left toes  Consent was obtained for treatment procedures.   Mechanical debridement of nails 1-5  bilaterally performed with a nail nipper.  Filed with dremel without incident.    Return office visit   4 months                   Told patient to return for periodic foot care and evaluation due to potential at risk complications.     Gardiner Barefoot DPM

## 2022-10-23 ENCOUNTER — Telehealth (INDEPENDENT_AMBULATORY_CARE_PROVIDER_SITE_OTHER): Payer: Self-pay

## 2022-10-23 NOTE — Telephone Encounter (Signed)
Patient has left two messages and I returned the call and spoke with him regarding getting scheduled for a bilateral leg angio's with Dr. Lucky Cowboy. Patient was offered 01/29 and 11/09/22 and 02/01 and 11/12/22. Patient stated he would call to let me know which dates work best for him.

## 2022-10-27 ENCOUNTER — Other Ambulatory Visit: Payer: Self-pay | Admitting: Family Medicine

## 2022-10-27 ENCOUNTER — Encounter (INDEPENDENT_AMBULATORY_CARE_PROVIDER_SITE_OTHER): Payer: Self-pay

## 2022-10-27 ENCOUNTER — Ambulatory Visit: Payer: 59 | Admitting: Family Medicine

## 2022-10-27 ENCOUNTER — Encounter: Payer: Self-pay | Admitting: Family Medicine

## 2022-10-27 ENCOUNTER — Ambulatory Visit (INDEPENDENT_AMBULATORY_CARE_PROVIDER_SITE_OTHER): Payer: 59

## 2022-10-27 VITALS — BP 140/80 | HR 90 | Temp 98.1°F | Resp 17 | Ht 68.5 in | Wt 228.4 lb

## 2022-10-27 DIAGNOSIS — R0789 Other chest pain: Secondary | ICD-10-CM | POA: Diagnosis not present

## 2022-10-27 DIAGNOSIS — R0602 Shortness of breath: Secondary | ICD-10-CM

## 2022-10-27 DIAGNOSIS — E1159 Type 2 diabetes mellitus with other circulatory complications: Secondary | ICD-10-CM | POA: Diagnosis not present

## 2022-10-27 DIAGNOSIS — R0609 Other forms of dyspnea: Secondary | ICD-10-CM | POA: Insufficient documentation

## 2022-10-27 DIAGNOSIS — I152 Hypertension secondary to endocrine disorders: Secondary | ICD-10-CM

## 2022-10-27 DIAGNOSIS — J432 Centrilobular emphysema: Secondary | ICD-10-CM | POA: Diagnosis not present

## 2022-10-27 HISTORY — DX: Other chest pain: R07.89

## 2022-10-27 LAB — COMPREHENSIVE METABOLIC PANEL
ALT: 35 U/L (ref 0–53)
AST: 22 U/L (ref 0–37)
Albumin: 5.1 g/dL (ref 3.5–5.2)
Alkaline Phosphatase: 81 U/L (ref 39–117)
BUN: 14 mg/dL (ref 6–23)
CO2: 27 mEq/L (ref 19–32)
Calcium: 10.4 mg/dL (ref 8.4–10.5)
Chloride: 102 mEq/L (ref 96–112)
Creatinine, Ser: 1.01 mg/dL (ref 0.40–1.50)
GFR: 83.22 mL/min (ref >60.00)
Glucose, Bld: 181 mg/dL — ABNORMAL HIGH (ref 70–99)
Potassium: 4.2 mEq/L (ref 3.5–5.1)
Sodium: 140 mEq/L (ref 135–145)
Total Bilirubin: 0.3 mg/dL (ref 0.2–1.2)
Total Protein: 7.7 g/dL (ref 6.0–8.3)

## 2022-10-27 LAB — CBC WITH DIFFERENTIAL/PLATELET
Basophils Absolute: 0.1 10*3/uL (ref 0.0–0.1)
Basophils Relative: 1.2 % (ref 0.0–3.0)
Eosinophils Absolute: 0.3 10*3/uL (ref 0.0–0.7)
Eosinophils Relative: 6.8 % — ABNORMAL HIGH (ref 0.0–5.0)
HCT: 47.2 % (ref 39.0–52.0)
Hemoglobin: 15.3 g/dL (ref 13.0–17.0)
Lymphocytes Relative: 44.8 % (ref 12.0–46.0)
Lymphs Abs: 2 10*3/uL (ref 0.7–4.0)
MCHC: 32.4 g/dL (ref 30.0–36.0)
MCV: 84.3 fl (ref 78.0–100.0)
Monocytes Absolute: 0.4 10*3/uL (ref 0.1–1.0)
Monocytes Relative: 8.1 % (ref 3.0–12.0)
Neutro Abs: 1.7 10*3/uL (ref 1.4–7.7)
Neutrophils Relative %: 39.1 % — ABNORMAL LOW (ref 43.0–77.0)
Platelets: 300 10*3/uL (ref 150.0–400.0)
RBC: 5.6 Mil/uL (ref 4.22–5.81)
RDW: 14.8 % (ref 11.5–15.5)
WBC: 4.5 10*3/uL (ref 4.0–10.5)

## 2022-10-27 MED ORDER — PROAIR HFA 108 (90 BASE) MCG/ACT IN AERS: 1.0000 | INHALATION_SPRAY | Freq: Four times a day (QID) | RESPIRATORY_TRACT | 1 refills | Status: DC | PRN

## 2022-10-27 MED ORDER — VALSARTAN 320 MG PO TABS: 320.0000 mg | ORAL_TABLET | Freq: Every day | ORAL | 0 refills | Status: DC

## 2022-10-27 NOTE — Progress Notes (Unsigned)
   SUBJECTIVE:   Chief Complaint  Patient presents with   Breathing Problem    C/O labored breathing x I month. Denies Chest pain. +Vapes & former smoker & h/o childhood astha. Has a slight cough to clear lungs.   HPI Dyspnea: Patient complains of {SOB:10319}.  Symptoms include {lung sx:10422}. Symptoms began {numbers; 0-10:33138} {time units w/ wo plural:11} ago, {course:17::"unchanged"} since that time.  Patient denies {lung sx:10422}. Associated symptoms include {resp sx:16811}. Patient {has/not:15037} had recent travel.  Weight {weight:10426}.  Appetite has been {increased:30183}. Symptoms are exacerbated by {triggers:2002}. Symptoms are alleviated by {alleviating factors dyspnea:60385}.    PERTINENT PMH / PSH: ***  OBJECTIVE:  BP (!) 140/80   Pulse 90   Temp 98.1 F (36.7 C) (Oral)   Resp 17   Ht 5' 8.5" (1.74 m)   Wt 228 lb 6 oz (103.6 kg)   SpO2 98%   BMI 34.22 kg/m    Physical Exam  ASSESSMENT/PLAN:  Shortness of breath -     EKG 12-Lead -     DG Chest 2 View; Future  Chest heaviness Assessment & Plan: EKG: normal EKG, normal sinus rhythm, unchanged from previous tracings.   Orders: -     EKG 12-Lead -     DG Chest 2 View; Future -     Comprehensive metabolic panel -     CBC with Differential/Platelet   PDMP reviewed***  No follow-ups on file.  Carollee Leitz, MD

## 2022-10-27 NOTE — Patient Instructions (Addendum)
It was a pleasure meeting you today. Thank you for allowing me to take part in your health care.  Our goals for today as we discussed include:  Recommend stop vaping as this has improved your shortness of breath a little Start Albuterol 1-2 puffs every 4-6 hours as needed for shortness of breath or wheezing If symptoms do not improve can refer to Pulmonology You were previously seen by Geraldo Pitter NP at [facility].  You can call to schedule an appointment with her.  If you need referral would be happy to send one.  Your blood pressure is high Increase Valsartan to 320 mg daily Monitor blood pressure at home.  Goal <130/80 Follow up in 3 months  We will get some labs today.  If they are abnormal or we need to do something about them, I will call you.  If they are normal, I will send you a message on MyChart (if it is active) or a letter in the mail.  If you don't hear from [place] in 2 weeks, please call the office at the number below.   Your EKG was normal today  Will get a chest xray.  If any worsening symptoms please go to urgent care or ED   If you have any questions or concerns, please do not hesitate to call the office at [phone].  I look forward to our next visit and until then take care and stay safe.  Regards,   Carollee Leitz, MD   [facility]

## 2022-10-27 NOTE — Assessment & Plan Note (Signed)
EKG: normal EKG, normal sinus rhythm, unchanged from previous tracings.

## 2022-10-28 ENCOUNTER — Encounter (INDEPENDENT_AMBULATORY_CARE_PROVIDER_SITE_OTHER): Payer: Self-pay

## 2022-10-28 ENCOUNTER — Encounter: Payer: Self-pay | Admitting: Family Medicine

## 2022-10-28 NOTE — Assessment & Plan Note (Signed)
Chronic.  Ongoing greater than 1 month.  Suspect increasing weight and vaping as contributing factors for shortness of breath.  Low suspicion for cardiac etiology given euvolemic on exam.  Lung exam without wheezing today. EKG shows sinus rhythm without ST elevation or depression. Chest x-ray  Albuterol inhaler Strict return precautions provided

## 2022-10-28 NOTE — Assessment & Plan Note (Addendum)
Chronic.  Suspect vaping triggering worsening shortness of breath.  Low suspicion for cardiac etiology given cardiac exam normal, EKG showing normal sinus rhythm, no changes from previous EKGs. Chest x-ray negative for acute cardiopulmonary changes. Refill albuterol 2 puffs every 4-6 hours as needed Encouraged vaping cessation Encourage weight loss Patient previously seen by [facility] pulmonology, recommend patient schedule appointment to follow-up Strict return precautions provided.

## 2022-10-28 NOTE — Telephone Encounter (Signed)
I called the patient regarding his MyChart messages as I need additional information.  Regarding the ability to return back to work on February 2, I advised him that because his job is primarily work from home if he is going to be doing very little activity things that day he may be able to return but if he has any strenuous will require his full focus and attention, he may not be at 100%.  I recommended that if he can take off the second that would likely be best and he should be able to return back to work by Monday.  In regards to the injection in his back, I discussed with the patient that he will be placed on Plavix and aspirin likely postprocedure.  Given this, he will have to be on them for a bare minimum of 3 months with a preferable 6 months.  If the practice giving these back injection requires him to hold any antiplatelet or anticoagulation therapy prior to his procedure that we will be an issue before his back injection.  I offered to suggestions if this is the case.  Either he can move up his injection to be done several days prior to his angiogram, alternatively we can move his first angiogram date from February 1.  In that instance his first angiogram would be the one that is scheduled on February 12 and then we will plan the second leg approximately 2 weeks from that standpoint.  The patient is advised to contact our office to inform us if we do need to make adjustments prior to his procedure on February 1.

## 2022-10-28 NOTE — Assessment & Plan Note (Signed)
Chronic.  Elevated today.  BP remains elevated. Increase valsartan to 320 mg daily Monitor BP at home Follow-up in 3 months or sooner if BP remains greater than 130/80

## 2022-10-29 ENCOUNTER — Other Ambulatory Visit: Payer: Self-pay | Admitting: Family Medicine

## 2022-10-29 ENCOUNTER — Other Ambulatory Visit (HOSPITAL_COMMUNITY): Payer: Self-pay

## 2022-10-29 DIAGNOSIS — R0602 Shortness of breath: Secondary | ICD-10-CM

## 2022-10-29 MED ORDER — ALBUTEROL SULFATE HFA 108 (90 BASE) MCG/ACT IN AERS: 2.0000 | INHALATION_SPRAY | Freq: Four times a day (QID) | RESPIRATORY_TRACT | 0 refills | Status: DC | PRN

## 2022-10-29 NOTE — Telephone Encounter (Signed)
Per test claim Albuterol 8.5gm HFA is covered under insurance at this time, it looks like the 18gm HFA is the one that was sent in.

## 2022-11-03 ENCOUNTER — Other Ambulatory Visit: Payer: Self-pay

## 2022-11-03 ENCOUNTER — Telehealth (INDEPENDENT_AMBULATORY_CARE_PROVIDER_SITE_OTHER): Payer: Self-pay

## 2022-11-03 NOTE — Telephone Encounter (Signed)
Ventolin sent in

## 2022-11-03 NOTE — Telephone Encounter (Signed)
I attempted to contact the patient to discuss his RLE and LLE angio with Dr. Lucky Cowboy. Patient is scheduled for 11/16/22 ( 6:45 am arrival time for RLE) and 11/30/22 ( 6:45 am arrival time for LLE) [facility]. Pre-procedure instructions will be mailed.

## 2022-11-05 DIAGNOSIS — I70219 Atherosclerosis of native arteries of extremities with intermittent claudication, unspecified extremity: Secondary | ICD-10-CM

## 2022-11-06 LAB — HM DIABETES EYE EXAM

## 2022-11-13 HISTORY — PX: OTHER SURGICAL HISTORY: SHX169

## 2022-11-16 ENCOUNTER — Encounter
Admission: RE | Disposition: A | Discharge status: Home / Self Care | Payer: Self-pay | Source: Ambulatory Visit | Attending: Vascular Surgery

## 2022-11-16 ENCOUNTER — Other Ambulatory Visit: Payer: Self-pay

## 2022-11-16 ENCOUNTER — Ambulatory Visit
Admission: RE | Admit: 2022-11-16 | Discharge: 2022-11-16 | Disposition: A | Discharge status: Home / Self Care | Payer: 59 | Source: Ambulatory Visit | Attending: Vascular Surgery | Admitting: Vascular Surgery

## 2022-11-16 ENCOUNTER — Encounter: Payer: Self-pay | Admitting: Vascular Surgery

## 2022-11-16 DIAGNOSIS — I10 Essential (primary) hypertension: Secondary | ICD-10-CM | POA: Diagnosis not present

## 2022-11-16 DIAGNOSIS — I70219 Atherosclerosis of native arteries of extremities with intermittent claudication, unspecified extremity: Secondary | ICD-10-CM

## 2022-11-16 DIAGNOSIS — E1151 Type 2 diabetes mellitus with diabetic peripheral angiopathy without gangrene: Secondary | ICD-10-CM | POA: Insufficient documentation

## 2022-11-16 DIAGNOSIS — Z8249 Family history of ischemic heart disease and other diseases of the circulatory system: Secondary | ICD-10-CM | POA: Diagnosis not present

## 2022-11-16 DIAGNOSIS — I70221 Atherosclerosis of native arteries of extremities with rest pain, right leg: Secondary | ICD-10-CM | POA: Diagnosis present

## 2022-11-16 DIAGNOSIS — Z7984 Long term (current) use of oral hypoglycemic drugs: Secondary | ICD-10-CM | POA: Insufficient documentation

## 2022-11-16 DIAGNOSIS — E785 Hyperlipidemia, unspecified: Secondary | ICD-10-CM | POA: Insufficient documentation

## 2022-11-16 DIAGNOSIS — Z87891 Personal history of nicotine dependence: Secondary | ICD-10-CM | POA: Diagnosis not present

## 2022-11-16 DIAGNOSIS — I70212 Atherosclerosis of native arteries of extremities with intermittent claudication, left leg: Secondary | ICD-10-CM | POA: Insufficient documentation

## 2022-11-16 HISTORY — PX: LOWER EXTREMITY ANGIOGRAPHY: CATH118251

## 2022-11-16 LAB — BUN: BUN: 16 mg/dL (ref 6–20)

## 2022-11-16 LAB — GLUCOSE, CAPILLARY
Glucose-Capillary: 179 mg/dL — ABNORMAL HIGH (ref 70–99)
Glucose-Capillary: 137 mg/dL — ABNORMAL HIGH (ref 70–99)

## 2022-11-16 LAB — CREATININE, SERUM
Creatinine, Ser: 0.87 mg/dL (ref 0.61–1.24)
GFR, Estimated: >60 mL/min (ref >60)

## 2022-11-16 SURGERY — LOWER EXTREMITY ANGIOGRAPHY
Anesthesia: Moderate Sedation | Site: Leg Lower | Laterality: Right

## 2022-11-16 MED ORDER — ASPIRIN 81 MG PO TBEC: 81.0000 mg | DELAYED_RELEASE_TABLET | Freq: Every day | ORAL | Status: DC

## 2022-11-16 MED ORDER — LABETALOL HCL 5 MG/ML IV SOLN: 10.0000 mg | INTRAVENOUS | Status: DC | PRN

## 2022-11-16 MED ORDER — CLOPIDOGREL BISULFATE 75 MG PO TABS
ORAL_TABLET | ORAL | Status: AC
  Administered 2022-11-16: 75 mg via ORAL
  Filled 2022-11-16: qty 1

## 2022-11-16 MED ORDER — CEFAZOLIN SODIUM-DEXTROSE 2-4 GM/100ML-% IV SOLN: 2.0000 g | INTRAVENOUS | Status: DC

## 2022-11-16 MED ORDER — ONDANSETRON HCL 4 MG/2ML IJ SOLN: 4.0000 mg | Freq: Four times a day (QID) | INTRAMUSCULAR | Status: DC | PRN

## 2022-11-16 MED ORDER — SODIUM CHLORIDE 0.9% FLUSH: 3.0000 mL | Freq: Two times a day (BID) | INTRAVENOUS | Status: DC

## 2022-11-16 MED ORDER — SODIUM CHLORIDE 0.9 % IV SOLN: 250.0000 mL | INTRAVENOUS | Status: DC | PRN

## 2022-11-16 MED ORDER — MIDAZOLAM HCL 2 MG/2ML IJ SOLN
INTRAMUSCULAR | Status: DC | PRN
  Administered 2022-11-16: 2 mg via INTRAVENOUS

## 2022-11-16 MED ORDER — ACETAMINOPHEN 325 MG PO TABS: 650.0000 mg | ORAL_TABLET | ORAL | Status: DC | PRN

## 2022-11-16 MED ORDER — FENTANYL CITRATE (PF) 100 MCG/2ML IJ SOLN
INTRAMUSCULAR | Status: AC
  Filled 2022-11-16: qty 2

## 2022-11-16 MED ORDER — ASPIRIN 81 MG PO TBEC
DELAYED_RELEASE_TABLET | ORAL | Status: AC
  Administered 2022-11-16: 81 mg via ORAL
  Filled 2022-11-16: qty 1

## 2022-11-16 MED ORDER — SODIUM CHLORIDE 0.9 % IV SOLN: INTRAVENOUS | Status: DC

## 2022-11-16 MED ORDER — HEPARIN SODIUM (PORCINE) 1000 UNIT/ML IJ SOLN
INTRAMUSCULAR | Status: AC
  Filled 2022-11-16: qty 10

## 2022-11-16 MED ORDER — MIDAZOLAM HCL 2 MG/ML PO SYRP: 8.0000 mg | ORAL_SOLUTION | Freq: Once | ORAL | Status: DC | PRN

## 2022-11-16 MED ORDER — FENTANYL CITRATE (PF) 100 MCG/2ML IJ SOLN
INTRAMUSCULAR | Status: DC | PRN
  Administered 2022-11-16: 50 ug via INTRAVENOUS

## 2022-11-16 MED ORDER — CLOPIDOGREL BISULFATE 75 MG PO TABS: 75.0000 mg | ORAL_TABLET | Freq: Every day | ORAL | Status: DC

## 2022-11-16 MED ORDER — HEPARIN SODIUM (PORCINE) 1000 UNIT/ML IJ SOLN
INTRAMUSCULAR | Status: DC | PRN
  Administered 2022-11-16: 5000 [IU] via INTRAVENOUS

## 2022-11-16 MED ORDER — CLOPIDOGREL BISULFATE 75 MG PO TABS: 75.0000 mg | ORAL_TABLET | Freq: Every day | ORAL | 11 refills | Status: DC

## 2022-11-16 MED ORDER — HYDRALAZINE HCL 20 MG/ML IJ SOLN: 5.0000 mg | INTRAMUSCULAR | Status: DC | PRN

## 2022-11-16 MED ORDER — CEFAZOLIN SODIUM-DEXTROSE 1-4 GM/50ML-% IV SOLN
INTRAVENOUS | Status: DC | PRN
  Administered 2022-11-16: 2 g via INTRAVENOUS

## 2022-11-16 MED ORDER — FAMOTIDINE 20 MG PO TABS: 40.0000 mg | ORAL_TABLET | Freq: Once | ORAL | Status: DC | PRN

## 2022-11-16 MED ORDER — HYDROMORPHONE HCL 1 MG/ML IJ SOLN: 1.0000 mg | Freq: Once | INTRAMUSCULAR | Status: DC | PRN

## 2022-11-16 MED ORDER — METHYLPREDNISOLONE SODIUM SUCC 125 MG IJ SOLR: 125.0000 mg | Freq: Once | INTRAMUSCULAR | Status: DC | PRN

## 2022-11-16 MED ORDER — IODIXANOL 320 MG/ML IV SOLN
INTRAVENOUS | Status: DC | PRN
  Administered 2022-11-16: 60 mL

## 2022-11-16 MED ORDER — DIPHENHYDRAMINE HCL 50 MG/ML IJ SOLN: 50.0000 mg | Freq: Once | INTRAMUSCULAR | Status: DC | PRN

## 2022-11-16 MED ORDER — MIDAZOLAM HCL 5 MG/5ML IJ SOLN
INTRAMUSCULAR | Status: AC
  Filled 2022-11-16: qty 5

## 2022-11-16 MED ORDER — SODIUM CHLORIDE 0.9% FLUSH: 3.0000 mL | INTRAVENOUS | Status: DC | PRN

## 2022-11-16 MED ORDER — ASPIRIN 81 MG PO TBEC: 81.0000 mg | DELAYED_RELEASE_TABLET | Freq: Every day | ORAL | 2 refills | Status: DC

## 2022-11-16 MED ORDER — CEFAZOLIN SODIUM-DEXTROSE 2-4 GM/100ML-% IV SOLN
INTRAVENOUS | Status: AC
  Filled 2022-11-16: qty 100

## 2022-11-16 SURGICAL SUPPLY — 24 items
BALLN LUTONIX 018 5X150X130 (BALLOONS) ×2
BALLN ULTRVRSE 3X300X150 (BALLOONS) ×1
BALLN ULTRVRSE 3X300X150 OTW (BALLOONS) ×1
BALLN ULTRVRSE 4X150X150 (BALLOONS) ×1
BALLN ULTRVRSE 5X150X150 (BALLOONS) ×1
BALLOON LUTONIX 018 5X150X130 (BALLOONS) IMPLANT
BALLOON ULTRVRSE 3X300X150 OTW (BALLOONS) IMPLANT
BALLOON ULTRVRSE 4X150X150 (BALLOONS) IMPLANT
BALLOON ULTRVRSE 5X150X150 (BALLOONS) IMPLANT
CATH ANGIO 5F PIGTAIL 65CM (CATHETERS) IMPLANT
CATH BEACON 5 .038 100 VERT TP (CATHETERS) IMPLANT
CATH SEEKER .035X135CM (CATHETERS) IMPLANT
COVER PROBE ULTRASOUND 5X96 (MISCELLANEOUS) IMPLANT
DEVICE STARCLOSE SE CLOSURE (Vascular Products) IMPLANT
GLIDEWIRE ADV .035X260CM (WIRE) IMPLANT
KIT ENCORE 26 ADVANTAGE (KITS) IMPLANT
PACK ANGIOGRAPHY (CUSTOM PROCEDURE TRAY) ×1 IMPLANT
SHEATH BRITE TIP 5FRX11 (SHEATH) IMPLANT
SHEATH FLEX ANSEL ANG 6F 45CM (SHEATH) IMPLANT
STENT VIABAHN 6X150X120 (Permanent Stent) IMPLANT
SYR MEDRAD MARK 7 150ML (SYRINGE) IMPLANT
TUBING CONTRAST HIGH PRESS 72 (TUBING) IMPLANT
WIRE G V18X300CM (WIRE) IMPLANT
WIRE GUIDERIGHT .035X150 (WIRE) IMPLANT

## 2022-11-16 NOTE — Op Note (Signed)
[facility]  Percutaneous Study/Intervention Procedural Note   Date of Surgery: 11/16/2022  Surgeon(s):Taisei Bonnette    Assistants:none  Pre-operative Diagnosis: PAD with claudication bilateral lower extremities and early rest Huerta on the right  Post-operative diagnosis:  Same  Procedure(s) Performed:             1.  Ultrasound guidance for vascular access left femoral artery             2.  Catheter placement into right common femoral artery from left femoral approach             3.  Aortogram and selective right lower extremity angiogram             4.  Percutaneous transluminal angioplasty of right proximal peroneal artery, tibioperoneal trunk with 3 mm diameter angioplasty balloon             5.  Percutaneous transluminal angioplasty of the right common femoral artery and proximal SFA with 5 mm diameter Lutonix drug-coated angioplasty balloon  6.  Percutaneous transluminal angioplasty of right popliteal artery and distal SFA with 4 mm diameter by 15 cm length Lutonix drug-coated angioplasty balloon  7.  Viabahn stent placement to the right most distal SFA and above-knee popliteal artery with 6 mm diameter by 15 cm length Viabahn stent for greater than 50% residual stenosis after angioplasty             8.  StarClose closure device left femoral artery  EBL: 10 cc  Contrast: 60 cc  Fluoro Time: 6.6 minutes  Moderate Conscious Sedation Time: approximately 39 minutes using 2 mg of Versed and 50 mcg of Fentanyl              Indications:  Patient is a 57 y.o.male with disabling claudication symptoms of both lower extremities and possibly some early rest Huerta symptoms particular on the right. The patient has noninvasive study showing reduced ABIs bilaterally. The patient is brought in for angiography for further evaluation and potential treatment.  Due to the limb threatening nature of the situation, angiogram was performed for attempted limb salvage. The patient is  aware that if the procedure fails, amputation would be expected.  The patient also understands that even with successful revascularization, amputation may still be required due to the severity of the situation.  Risks and benefits are discussed and informed consent is obtained.   Procedure:  The patient was identified and appropriate procedural time out was performed.  The patient was then placed supine on the table and prepped and draped in the usual sterile fashion. Moderate conscious sedation was administered during a face to face encounter with the patient throughout the procedure with my supervision of the RN administering medicines and monitoring the patient's vital signs, pulse oximetry, telemetry and mental status throughout from the start of the procedure until the patient was taken to the recovery room. Ultrasound was used to evaluate the left common femoral artery.  It was patent .  A digital ultrasound image was acquired.  A Seldinger needle was used to access the left common femoral artery under direct ultrasound guidance and a permanent image was performed.  A 0.035 J wire was advanced without resistance and a 5Fr sheath was placed.  Pigtail catheter was placed into the aorta and an AP aortogram was performed. This demonstrated normal renal arteries and normal aorta and iliac segments without significant stenosis. I then crossed the aortic bifurcation and advanced to the right femoral  head. Selective right lower extremity angiogram was then performed. This demonstrated a large calcific lesion creating a greater than 80% stenosis of the origin of the profunda femoris artery and about a 60% stenosis at the origin of the superficial femoral artery emanating from the distal common femoral artery.  The SFA was diffusely irregular with mild disease until the distal SFA and above-knee popliteal artery where there was an occlusion over about 8 to 10 cm.  The below-knee popliteal artery was somewhat diseased  and there was reconstitution through tibial vessels with disease at the origin of the anterior tibial artery and tibioperoneal trunk creating approximately 60% stenosis.  The peroneal artery and the posterior tibial arteries appear to be the best runoff distally after the proximal disease. It was felt that it was in the patient's best interest to proceed with intervention after these images to avoid a second procedure and a larger amount of contrast and fluoroscopy based off of the findings from the initial angiogram. The patient was systemically heparinized and a 6 [place] Ansell sheath was then placed over the [employer] wire. I then used a Kumpe catheter and the advantage wire to cross the SFA disease and then get across the popliteal occlusion and confirm intraluminal flow in the tibioperoneal trunk.  A 0.018 wire was then placed down into the peroneal artery and I proceeded with treatment.  The tibioperoneal trunk and proximal peroneal artery were treated with a 3 mm diameter angioplasty balloon inflated to 8 atm for 1 minute.  This was used to predilate the tight calcific popliteal lesion as well.  I then used a 5 mm diameter by 15 cm length Lutonix drug-coated angioplasty balloon for the proximal SFA and common femoral artery and inflated this to 10 atm for 1 minute.  A 5 mm balloon would not initially cross the popliteal artery and so I downsized to a 4 mm diameter by 15 cm length Lutonix drug-coated angioplasty balloon and then a 5 mm diameter by 15 cm length conventional angioplasty balloon to treat the distal SFA and above-knee popliteal artery.  The tibioperoneal trunk had less than 20% residual stenosis after angioplasty.  There is still about a 30 to 40% residual stenosis at the SFA origin but any further treatment there would require surgical therapy and addressing the proximal profunda femoris artery lesion as well.  There is still a greater than 50% residual stenosis in the popliteal artery so  a 6 mm diameter by 15 cm length Viabahn stent was deployed in the distal SFA and above-knee popliteal artery and postdilated with a 5 mm balloon with excellent angiographic completion result about a 10% residual stenosis. I elected to terminate the procedure. The sheath was removed and StarClose closure device was deployed in the left femoral artery with excellent hemostatic result. The patient was taken to the recovery room in stable condition having tolerated the procedure well.  Findings:               Aortogram:  This demonstrated normal renal arteries and normal aorta and iliac segments without significant stenosis.             Right Lower Extremity:  This demonstrated a large calcific lesion creating a greater than 80% stenosis of the origin of the profunda femoris artery and about a 60% stenosis at the origin of the superficial femoral artery emanating from the distal common femoral artery.  The SFA was diffusely irregular with mild disease until the distal SFA and above-knee  popliteal artery where there was an occlusion over about 8 to 10 cm.  The below-knee popliteal artery was somewhat diseased and there was reconstitution through tibial vessels with disease at the origin of the anterior tibial artery and tibioperoneal trunk creating approximately 60% stenosis.  The peroneal artery and the posterior tibial arteries appear to be the best runoff distally after the proximal disease.   Disposition: Patient was taken to the recovery room in stable condition having tolerated the procedure well.  Complications: None  Kevin Huerta 11/16/2022 9:30 AM   This note was created with Dragon Medical transcription system. Any errors in dictation are purely unintentional.

## 2022-11-16 NOTE — Interval H&P Note (Signed)
History and Physical Interval Note:  11/16/2022 7:13 AM  Kevin Huerta  has presented today for surgery, with the diagnosis of RLE Angio   BARD  ASO w claudication.  The various methods of treatment have been discussed with the patient and family. After consideration of risks, benefits and other options for treatment, the patient has consented to  Procedure(s): Lower Extremity Angiography (Right) as a surgical intervention.  The patient's history has been reviewed, patient examined, no change in status, stable for surgery.  I have reviewed the patient's chart and labs.  Questions were answered to the patient's satisfaction.     Leotis Pain

## 2022-11-18 ENCOUNTER — Encounter (INDEPENDENT_AMBULATORY_CARE_PROVIDER_SITE_OTHER): Payer: Self-pay

## 2022-11-18 ENCOUNTER — Ambulatory Visit
Admission: EM | Admit: 2022-11-18 | Discharge: 2022-11-18 | Disposition: A | Discharge status: Home / Self Care | Payer: 59 | Attending: Emergency Medicine | Admitting: Emergency Medicine

## 2022-11-18 DIAGNOSIS — J0100 Acute maxillary sinusitis, unspecified: Secondary | ICD-10-CM | POA: Diagnosis present

## 2022-11-18 DIAGNOSIS — R0981 Nasal congestion: Secondary | ICD-10-CM | POA: Insufficient documentation

## 2022-11-18 DIAGNOSIS — Z1152 Encounter for screening for COVID-19: Secondary | ICD-10-CM | POA: Diagnosis not present

## 2022-11-18 DIAGNOSIS — H9312 Tinnitus, left ear: Secondary | ICD-10-CM | POA: Diagnosis present

## 2022-11-18 MED ORDER — AMOXICILLIN 875 MG PO TABS: 875.0000 mg | ORAL_TABLET | Freq: Two times a day (BID) | ORAL | 0 refills | Status: AC

## 2022-11-18 NOTE — ED Provider Notes (Signed)
Kevin Huerta    CSN: [id number] Arrival date & time: 11/18/22  1721      History   Chief Complaint Chief Complaint  Patient presents with   Nasal Congestion   Cough   Otalgia    HPI Kevin Huerta is a 57 y.o. male.  Patient presents with 10 day history of congestion and cough.  He developed ringing in his left ear yesterday.  He reports fatigue today.  No fever, rash, sore throat, wheezing, shortness of breath, or other symptoms.  Treating symptoms with Nasonex, Zyrtec, cough drops.  Patient was started on aspirin on 11/16/2022 after having a stent in his RLE.  His medical history includes asthma, allergies, diabetes, hypertension.    The history is provided by the patient and medical records.    Past Medical History:  Diagnosis Date   Asthma    BRBPR (bright red blood per rectum) 05/24/2020   Dermatitis 06/02/2021   Diabetes mellitus without complication ([place])    Diarrhea 02/26/2021   Fatty liver 09/03/2020   Heart murmur    Hyperlipidemia    Hypersomnia 07/17/2014   Hypertension    Leg cramps 02/26/2021    Patient Active Problem List   Diagnosis Date Noted   Shortness of breath 10/27/2022   Chest heaviness 10/27/2022   Bronchitis 06/26/2022   Chronic bilateral low back pain with right-sided sciatica 06/15/2022   Tinea 05/07/2022   Right wrist pain 02/26/2021   Pain due to onychomycosis of toenails of both feet 01/30/2021   Atherosclerosis of aorta ([place]) 11/07/2020   Coronary artery disease 11/07/2020   Emphysema lung ([place]) 11/07/2020   History of tobacco use 10/15/2020   Hyperlipidemia associated with type 2 diabetes mellitus ([place]) 10/15/2020   Insomnia 10/15/2020   Fatty liver 09/03/2020   Left wrist pain 01/29/2020   Chronic arthropathy 01/29/2020   Chronic gouty arthropathy 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 11/24/2017   OSA (obstructive sleep apnea) 07/17/2014   Snoring 07/17/2014   Cervical radiculopathy 01/22/2011   Hypertension associated with diabetes ([place])  01/22/2011   Type 2 diabetes mellitus without complication, without long-term current use of insulin ([place]) 01/22/2011   Eczema 04/15/2010   Hyperlipidemia LDL goal <100 03/13/2010   Generalized anxiety disorder 12/17/2009   Allergic rhinitis 07/30/2009   Asthma 07/29/2009   Depression 03/31/2007   Gout 03/31/2007   Herpes simplex 03/31/2007    Past Surgical History:  Procedure Laterality Date   CERVICAL DISC SURGERY     fusion   KNEE SURGERY Right    LOWER EXTREMITY ANGIOGRAPHY Right 11/16/2022   Procedure: Lower Extremity Angiography;  Surgeon: Algernon Huxley, MD;  Location: [facility];  Service: Cardiovascular;  Laterality: Right;   OTHER SURGICAL HISTORY  11/13/2022   steroid injection to lumbar spine   ROTATOR CUFF REPAIR     WISDOM TOOTH EXTRACTION         Home Medications    Prior to Admission medications   Medication Sig Start Date End Date Taking? Authorizing Provider  amoxicillin (AMOXIL) 875 MG tablet Take 1 tablet (875 mg total) by mouth 2 (two) times daily for 10 days. 11/18/22 11/28/22 Yes Sharion Balloon, NP  albuterol (VENTOLIN HFA) 108 (90 Base) MCG/ACT inhaler Inhale 2 puffs into the lungs every 6 (six) hours as needed for wheezing or shortness of breath. 10/29/22   Carollee Leitz, MD  allopurinol (ZYLOPRIM) 100 MG tablet Take 200 mg by mouth daily. 02/21/18   [provider]  ALPRAZolam Duanne Moron) 0.5 MG  tablet Take 0.5 mg by mouth as needed. 10/17/18   [provider]  aspirin EC 81 MG tablet Take 1 tablet (81 mg total) by mouth daily. Swallow whole. 11/16/22 11/16/23  Algernon Huxley, MD  atorvastatin (LIPITOR) 80 MG tablet Take 1 tablet (80 mg total) by mouth daily. 10/03/22   Carollee Leitz, MD  Blood Glucose Monitoring Suppl ([place]) w/Device KIT by Does not apply route. 06/04/17   [provider]  buPROPion (WELLBUTRIN XL) 150 MG 24 hr tablet Take 150 mg by mouth daily. Patient not taking: Reported on 11/16/2022 04/09/20    [provider]  cetirizine (ZYRTEC) 10 MG tablet Take 10 mg by mouth daily.    [provider]  clopidogrel (PLAVIX) 75 MG tablet Take 1 tablet (75 mg total) by mouth daily. 11/16/22   Algernon Huxley, MD  clotrimazole-betamethasone (LOTRISONE) cream Apply 1 Application topically daily. 05/07/22   Waunita Schooner, MD  colchicine 0.6 MG tablet Take 0.6 mg by mouth as needed. 06/20/19   [provider]  Continuous Blood Gluc Sensor (FREESTYLE LIBRE 2 SENSOR) MISC USE TO CHECK GLUCOSE THREE TIMES A DAY 11/19/21   Waunita Schooner, MD  gabapentin (NEURONTIN) 300 MG capsule Take 300 mg by mouth 2 (two) times daily. Patient not taking: Reported on 11/16/2022 05/01/22   [provider]  glipiZIDE (GLUCOTROL XL) 10 MG 24 hr tablet Take 10 mg by mouth. 03/17/18   [provider]  ipratropium (ATROVENT) 0.06 % nasal spray Place 2 sprays into both nostrils 4 (four) times daily. 06/13/21   Margarette Canada, NP  ketoconazole (NIZORAL) 2 % cream APPLY TOPICALLY TWICE A DAY AS NEEDED 04/26/19   [provider]  Melatonin 10 MG CAPS Take 10 mg by mouth every evening.    [provider]  Multiple Vitamin (MULTI-VITAMIN) tablet Take by mouth.    [provider]  omeprazole (PRILOSEC) 40 MG capsule Take 1 capsule (40 mg total) by mouth in the morning and at bedtime. 10/12/22   Carollee Leitz, MD  OneTouch Delica Lancets [id number] MISC by Does not apply route. 06/04/17   [provider]  ONETOUCH VERIO test strip  08/15/18   [provider]  pioglitazone (ACTOS) 15 MG tablet Take 15 mg by mouth daily. 10/17/22   [provider]  Semaglutide, 2 MG/DOSE, (OZEMPIC, 2 MG/DOSE,) 8 MG/3ML SOPN Inject into the skin. 03/04/21   [provider]  SYNJARDY XR 12.02-999 MG TB24 Take by mouth. 03/05/21   [provider]  traZODone (DESYREL) 100 MG tablet Take 100 mg by mouth at bedtime. 03/23/21   [provider]  valsartan (DIOVAN) 320 MG  tablet Take 1 tablet (320 mg total) by mouth daily. 10/27/22   Carollee Leitz, MD    Family History Family History  Problem Relation Age of Onset   Heart attack Father 57   Alcohol abuse Father    Drug abuse Father    Heart disease Father    Lung cancer Father    Hypertension Mother    Heart attack Maternal Grandmother 51   Stroke Maternal Aunt 65    Social History Social History   Tobacco Use   Smoking status: Former    Packs/day: 1.50    Years: 32.00    Total pack years: 48.00    Types: Cigarettes    Quit date: 06/11/2018    Years since quitting: 4.4   Smokeless tobacco: Former    Types: Snuff  Quit date: 1996  Vaping Use   Vaping Use: Some days   Devices: x3 per day to quit smoking nicotine   Substance Use Topics   Alcohol use: Yes    Comment: cut back, 6 pack per month   Drug use: Not Currently    Types: Marijuana    Comment: a few times a year     Allergies   Patient has no known allergies.   Review of Systems Review of Systems  Constitutional:  Negative for chills and fever.  HENT:  Positive for congestion. Negative for ear discharge, ear pain and sore throat.        Ringing in left ear.  Respiratory:  Positive for cough. Negative for shortness of breath.   Cardiovascular:  Negative for chest pain and palpitations.  Gastrointestinal:  Negative for diarrhea and vomiting.  Skin:  Negative for color change and rash.  All other systems reviewed and are negative.    Physical Exam Triage Vital Signs ED Triage Vitals  Enc Vitals Group     BP 11/18/22 1805 (!) 143/84     Pulse --      Resp --      Temp --      Temp src --      SpO2 --      Weight --      Height --      Head Circumference --      Peak Flow --      Pain Score 11/18/22 1801 0     Pain Loc --      Pain Edu? --      Excl. in [place]? --    No data found.  Updated Vital Signs BP (!) 143/84   Pulse (!) 104   Temp 98.4 F (36.9 C)   Resp 18   SpO2 97%   Visual Acuity Right Eye  Distance:   Left Eye Distance:   Bilateral Distance:    Right Eye Near:   Left Eye Near:    Bilateral Near:     Physical Exam Vitals and nursing note reviewed.  Constitutional:      General: He is not in acute distress.    Appearance: Normal appearance. He is well-developed. He is not ill-appearing.  HENT:     Right Ear: Tympanic membrane normal.     Left Ear: Tympanic membrane normal.     Nose: Congestion and rhinorrhea present.     Mouth/Throat:     Mouth: Mucous membranes are moist.     Pharynx: Oropharynx is clear.  Cardiovascular:     Rate and Rhythm: Normal rate and regular rhythm.     Heart sounds: Normal heart sounds.  Pulmonary:     Effort: Pulmonary effort is normal. No respiratory distress.     Breath sounds: Normal breath sounds.  Musculoskeletal:     Cervical back: Neck supple.  Skin:    General: Skin is warm and dry.  Neurological:     Mental Status: He is alert.  Psychiatric:        Mood and Affect: Mood normal.        Behavior: Behavior normal.      UC Treatments / Results  Labs (all labs ordered are listed, but only abnormal results are displayed) Labs Reviewed  SARS CORONAVIRUS 2 (TAT 6-24 HRS)    EKG   Radiology No results found.  Procedures Procedures (including critical care time)  Medications Ordered in UC Medications - No data to  display  Initial Impression / Assessment and Plan / UC Course  I have reviewed the triage vital signs and the nursing notes.  Pertinent labs & imaging results that were available during my care of the patient were reviewed by me and considered in my medical decision making (see chart for details).   Nasal congestion, acute sinusitis, tinnitus of the left ear.  Per patient request, COVID pending.  The tinnitus in his left ear started 1 day after starting on aspirin.  He was put on the aspirin after he had stent placed in his right leg.  Instructed patient to follow-up with his PCP regarding the aspirin and  tinnitus.  Treating his sinus infection with amoxicillin.  Education provided on sinus infection and tinnitus.  He agrees to plan of care.    Final Clinical Impressions(s) / UC Diagnoses   Final diagnoses:  Nasal congestion  Acute non-recurrent maxillary sinusitis  Tinnitus of left ear     Discharge Instructions      Take the amoxicillin as directed.  Follow up with your primary care provider.        ED Prescriptions     Medication Sig Dispense Auth. Provider   amoxicillin (AMOXIL) 875 MG tablet Take 1 tablet (875 mg total) by mouth 2 (two) times daily for 10 days. 20 tablet Sharion Balloon, NP      PDMP not reviewed this encounter.   Sharion Balloon, NP 11/18/22 1836

## 2022-11-18 NOTE — ED Triage Notes (Addendum)
Patient to Urgent Care with complaints of nasal congestion/ cough/ and ringing in his left ear.   Nasl congestion started approx 7-10 days ago. Using nasal sprays- nasonex/ cough drops/ zyrtec. Max temp 99. Ear symptoms started yesterday.   Reports symptoms worsened this afternoon- started feeling fatigued/ run down.

## 2022-11-18 NOTE — Discharge Instructions (Addendum)
Take the amoxicillin as directed.  Follow up with your primary care provider.

## 2022-11-19 LAB — SARS CORONAVIRUS 2 (TAT 6-24 HRS): SARS Coronavirus 2: NEGATIVE

## 2022-11-20 ENCOUNTER — Encounter (INDEPENDENT_AMBULATORY_CARE_PROVIDER_SITE_OTHER): Payer: Self-pay

## 2022-11-23 ENCOUNTER — Ambulatory Visit: Payer: 59 | Admitting: Family Medicine

## 2022-11-23 ENCOUNTER — Encounter: Payer: Self-pay | Admitting: Family Medicine

## 2022-11-23 VITALS — BP 140/78 | HR 98 | Temp 98.7°F | Ht 68.5 in | Wt 232.4 lb

## 2022-11-23 DIAGNOSIS — H9312 Tinnitus, left ear: Secondary | ICD-10-CM | POA: Diagnosis not present

## 2022-11-23 DIAGNOSIS — J309 Allergic rhinitis, unspecified: Secondary | ICD-10-CM

## 2022-11-23 DIAGNOSIS — G4733 Obstructive sleep apnea (adult) (pediatric): Secondary | ICD-10-CM

## 2022-11-23 DIAGNOSIS — R0602 Shortness of breath: Secondary | ICD-10-CM

## 2022-11-23 DIAGNOSIS — J432 Centrilobular emphysema: Secondary | ICD-10-CM

## 2022-11-23 MED ORDER — FLUTICASONE PROPIONATE 50 MCG/ACT NA SUSP: 2.0000 | Freq: Every day | NASAL | 6 refills | Status: AC

## 2022-11-23 NOTE — Progress Notes (Signed)
SUBJECTIVE:   Chief Complaint  Patient presents with   Acute Visit    Ringing in lft ear/ chest congestion   HPI Patient presents to clinic with ringing in left ear. Symptoms started day after initiation of ASA status post stent right lower extremity.  Spoke with vascular surgeon who is okay with discontinuing aspirin given recent stent placement right lower extremity, however to continue Plavix.  He is also being treated for nasal congestion and cough with Amoxil. Denies any headaches, visual changes, dizziness, vertigo, pulsations in ears, ear pain or discharge, or decrease in hearing. Denies any nausea/vomiting, gait disturbances, or weakness.    Emphysema Continues to have some mild SOB.  Using albuterol inhaler. Had previously seen [facility] but reports does not recall seeing lung specialist and no follow up.  History of OSA.   PERTINENT PMH / PSH: DM Type 2 HTN OSA  OBJECTIVE:  BP (!) 140/78   Pulse 98   Temp 98.7 F (37.1 C) (Oral)   Ht 5' 8.5" (1.74 m)   Wt 232 lb 6.4 oz (105.4 kg)   SpO2 99%   BMI 34.82 kg/m    Physical Exam Constitutional:      General: He is not in acute distress.    Appearance: Normal appearance. He is obese. He is not ill-appearing or toxic-appearing.  HENT:     Head: Normocephalic.     Right Ear: Tympanic membrane, ear canal and external ear normal. There is no impacted cerumen.     Left Ear: Tympanic membrane, ear canal and external ear normal. There is no impacted cerumen.     Nose: Congestion and rhinorrhea present.     Mouth/Throat:     Mouth: Mucous membranes are moist.  Eyes:     Conjunctiva/sclera: Conjunctivae normal.  Cardiovascular:     Rate and Rhythm: Normal rate and regular rhythm.     Pulses: Normal pulses.     Heart sounds: Normal heart sounds, S1 normal and S2 normal. Heart sounds not distant. No murmur heard. Pulmonary:     Effort: Pulmonary effort is normal. No respiratory distress.     Breath sounds:  Normal breath sounds. No wheezing or rhonchi.  Musculoskeletal:     Right lower leg: No edema.     Left lower leg: No edema.  Lymphadenopathy:     Cervical: No cervical adenopathy.  Skin:    Capillary Refill: Capillary refill takes less than 2 seconds.  Neurological:     Mental Status: He is alert. Mental status is at baseline.  Psychiatric:        Mood and Affect: Mood normal.        Behavior: Behavior normal.        Thought Content: Thought content normal.        Judgment: Judgment normal.     ASSESSMENT/PLAN:  Tinnitus aurium, left Assessment & Plan: Symptoms started at onset of ASA initiation.  No other associated symptoms. No focal deficits on exam, no impaction of cerumen, hearing normal Discontinue ASA If symptoms do not improve with discontinuation of medication plan for Audiology evaluation   Centrilobular emphysema ([place]) Assessment & Plan: Chronic.No signs of respiratory distress.   Continue Albuterol as needed Encouraged cessation of Vaping,, has cut back some. Referral Pulmonology  Orders: -     Ambulatory referral to Pulmonology  Shortness of breath Assessment & Plan: Improved slightly with decrease in vaping and use of albuterol   Orders: -     Ambulatory referral  to Pulmonology  OSA (obstructive sleep apnea) -     Ambulatory referral to Pulmonology  Allergic rhinitis, unspecified seasonality, unspecified trigger Assessment & Plan: Chronic Refill Flonase  Orders: -     Fluticasone Propionate; Place 2 sprays into both nostrils daily.  Dispense: 16 g; Refill: 6   PDMP reviewed  Return if symptoms worsen or fail to improve.  Carollee Leitz, MD

## 2022-11-23 NOTE — Patient Instructions (Addendum)
It was a pleasure meeting you today. Thank you for allowing me to take part in your health care.  Our goals for today as we discussed include:  Stop Aspirin  If ringing in ear worsens or does not go away please MyChart me and will send referral to ENT.  Referral sent to Pulmonology for evaluation of Emphysema  Start Flonase 2 sprays daily to nasal area Continue saline flush  Start Probiotics daily and continue for 14 days after completion   If you have any questions or concerns, please do not hesitate to call the office at [phone].  I look forward to our next visit and until then take care and stay safe.  Regards,   Carollee Leitz, MD   [facility]

## 2022-11-27 ENCOUNTER — Encounter: Payer: Self-pay | Admitting: Family Medicine

## 2022-11-27 ENCOUNTER — Telehealth (INDEPENDENT_AMBULATORY_CARE_PROVIDER_SITE_OTHER): Payer: Self-pay

## 2022-11-27 DIAGNOSIS — H9312 Tinnitus, left ear: Secondary | ICD-10-CM | POA: Insufficient documentation

## 2022-11-27 NOTE — Assessment & Plan Note (Addendum)
Chronic.No signs of respiratory distress.   Continue Albuterol as needed Encouraged cessation of Vaping,, has cut back some. Referral Pulmonology

## 2022-11-27 NOTE — Telephone Encounter (Signed)
Patient called to cancel his LLE angio with Dr. Lucky Cowboy on 11/30/22. Patient stated he has to clear up his insurance information that was sent to him. Patient has been canceled for now and will be rescheduled when he calls back.

## 2022-11-27 NOTE — Assessment & Plan Note (Signed)
Improved slightly with decrease in vaping and use of albuterol

## 2022-11-27 NOTE — Assessment & Plan Note (Addendum)
Symptoms started at onset of ASA initiation.  No other associated symptoms. No focal deficits on exam, no impaction of cerumen, hearing normal Discontinue ASA If symptoms do not improve with discontinuation of medication plan for Audiology evaluation

## 2022-11-27 NOTE — Assessment & Plan Note (Signed)
Chronic Refill Flonase

## 2022-11-30 ENCOUNTER — Encounter: Payer: Self-pay | Admitting: Family Medicine

## 2022-11-30 ENCOUNTER — Ambulatory Visit: Admission: RE | Admit: 2022-11-30 | Payer: 59 | Source: Home / Self Care | Admitting: Vascular Surgery

## 2022-11-30 ENCOUNTER — Encounter (INDEPENDENT_AMBULATORY_CARE_PROVIDER_SITE_OTHER): Payer: Self-pay

## 2022-11-30 ENCOUNTER — Encounter: Admission: RE | Payer: Self-pay | Source: Home / Self Care

## 2022-11-30 DIAGNOSIS — I70219 Atherosclerosis of native arteries of extremities with intermittent claudication, unspecified extremity: Secondary | ICD-10-CM

## 2022-11-30 SURGERY — LOWER EXTREMITY ANGIOGRAPHY
Anesthesia: Moderate Sedation | Site: Leg Lower | Laterality: Left

## 2022-12-01 ENCOUNTER — Other Ambulatory Visit: Payer: Self-pay

## 2022-12-01 DIAGNOSIS — H9312 Tinnitus, left ear: Secondary | ICD-10-CM

## 2022-12-02 ENCOUNTER — Other Ambulatory Visit: Payer: Self-pay

## 2022-12-02 DIAGNOSIS — E119 Type 2 diabetes mellitus without complications: Secondary | ICD-10-CM

## 2022-12-02 MED ORDER — FREESTYLE LIBRE 2 SENSOR MISC: 12 refills | Status: AC

## 2022-12-08 NOTE — Telephone Encounter (Signed)
I called the patient directly and a long discussion with the patient and his wife regarding the CPT codes and how it may be utilized as a way of generating a possible free cost by [facility] health.  However that is not the actual charge it is also likely not the inability may receive from the insurance.  Following this extensive discussion the patient is going to reach out to their insurance.

## 2022-12-14 ENCOUNTER — Other Ambulatory Visit (INDEPENDENT_AMBULATORY_CARE_PROVIDER_SITE_OTHER): Payer: Self-pay | Admitting: Vascular Surgery

## 2022-12-14 DIAGNOSIS — Z9889 Other specified postprocedural states: Secondary | ICD-10-CM

## 2022-12-15 ENCOUNTER — Ambulatory Visit (INDEPENDENT_AMBULATORY_CARE_PROVIDER_SITE_OTHER): Payer: 59

## 2022-12-15 ENCOUNTER — Ambulatory Visit (INDEPENDENT_AMBULATORY_CARE_PROVIDER_SITE_OTHER): Payer: 59 | Admitting: Nurse Practitioner

## 2022-12-15 ENCOUNTER — Encounter (INDEPENDENT_AMBULATORY_CARE_PROVIDER_SITE_OTHER): Payer: Self-pay | Admitting: Nurse Practitioner

## 2022-12-15 VITALS — BP 131/77 | HR 76 | Resp 16 | Wt 231.0 lb

## 2022-12-15 DIAGNOSIS — E1159 Type 2 diabetes mellitus with other circulatory complications: Secondary | ICD-10-CM | POA: Diagnosis not present

## 2022-12-15 DIAGNOSIS — E1169 Type 2 diabetes mellitus with other specified complication: Secondary | ICD-10-CM | POA: Diagnosis not present

## 2022-12-15 DIAGNOSIS — I70213 Atherosclerosis of native arteries of extremities with intermittent claudication, bilateral legs: Secondary | ICD-10-CM | POA: Diagnosis not present

## 2022-12-15 DIAGNOSIS — I152 Hypertension secondary to endocrine disorders: Secondary | ICD-10-CM | POA: Diagnosis not present

## 2022-12-15 DIAGNOSIS — Z9889 Other specified postprocedural states: Secondary | ICD-10-CM

## 2022-12-15 DIAGNOSIS — I739 Peripheral vascular disease, unspecified: Secondary | ICD-10-CM | POA: Diagnosis not present

## 2022-12-15 DIAGNOSIS — E785 Hyperlipidemia, unspecified: Secondary | ICD-10-CM

## 2022-12-16 ENCOUNTER — Encounter (INDEPENDENT_AMBULATORY_CARE_PROVIDER_SITE_OTHER): Payer: Self-pay | Admitting: Nurse Practitioner

## 2022-12-16 NOTE — Progress Notes (Signed)
Lung cancer Father     Hypertension Mother    Heart attack Maternal Grandmother 39   Stroke Maternal Aunt 65    Allergies  Allergen Reactions   Aspirin        Latest Ref Rng & Units 10/27/2022    9:48 AM 02/26/2021   10:02 AM 09/03/2020    4:08 PM  CBC  WBC 4.0 - 10.5 K/uL 4.5  5.2  6.0   Hemoglobin 13.0 - 17.0 g/dL 15.3  14.0  13.9   Hematocrit 39.0 - 52.0 % 47.2  43.6  42.9   Platelets 150.0 - 400.0 K/uL 300.0  291.0  335.0       CMP     Component Value Date/Time   NA 140 10/27/2022 0948   K 4.2 10/27/2022 0948   CL 102 10/27/2022 0948   CO2 27 10/27/2022 0948   GLUCOSE 181 (H) 10/27/2022 0948   BUN 16 11/16/2022 0752   CREATININE 0.87 11/16/2022 0752   CALCIUM 10.4 10/27/2022 0948   PROT 7.7 10/27/2022 0948   ALBUMIN 5.1 10/27/2022 0948   AST 22 10/27/2022 0948   ALT 35 10/27/2022 0948   ALKPHOS 81 10/27/2022 0948   BILITOT 0.3 10/27/2022 0948   GFRNONAA >60 11/16/2022 0752   GFRAA >60 06/17/2018 1512     VAS [place] ABI WITH/WO TBI  Result Date: 10/22/2022  LOWER EXTREMITY DOPPLER STUDY Patient Name:  Seikichi Coriano  Date of Exam:   10/21/2022 Medical Rec #: [id number]       Accession #:    [id number] Date of Birth: 1966/06/07       Patient Gender: M Patient Age:   57 years Exam Location:  [facility] Procedure:      VAS [place] ABI WITH/WO TBI Referring Phys: Eulogio Ditch --------------------------------------------------------------------------------  Indications: Claudication.  Performing Technologist: Concha Norway RVT  Examination Guidelines: A complete evaluation includes at minimum, Doppler waveform signals and systolic blood pressure reading at the level of bilateral brachial, anterior tibial, and posterior tibial arteries, when vessel segments are accessible. Bilateral testing is considered an integral part of a complete examination. Photoelectric Plethysmograph (PPG) waveforms and toe systolic pressure readings are included as required and additional duplex testing as needed.  Limited examinations for reoccurring indications may be performed as noted.  ABI Findings: +---------+------------------+-----+----------+--------+ Right    Rt Pressure (mmHg)IndexWaveform  Comment  +---------+------------------+-----+----------+--------+ Brachial 159                                       +---------+------------------+-----+----------+--------+ ATA      119               0.74 monophasic         +---------+------------------+-----+----------+--------+ PTA      115               0.72 monophasic         +---------+------------------+-----+----------+--------+ Great Toe116               0.72 Abnormal           +---------+------------------+-----+----------+--------+ +---------+------------------+-----+--------+-------+ Left     Lt Pressure (mmHg)IndexWaveformComment +---------+------------------+-----+--------+-------+ Brachial 160                                    +---------+------------------+-----+--------+-------+ ATA      126  Subjective:    Patient ID: Kevin Huerta, male    DOB: [birth year], 57 y.o.   MRN: [id number] Chief Complaint  Patient presents with   Follow-up    [facility] 4 week with ABI    The patient returns to the office for followup and review status post angiogram with intervention on 11/16/2022.   Procedure: Procedure(s) Performed:             1.  Ultrasound guidance for vascular access left femoral artery             2.  Catheter placement into right common femoral artery from left femoral approach             3.  Aortogram and selective right lower extremity angiogram             4.  Percutaneous transluminal angioplasty of right proximal peroneal artery, tibioperoneal trunk with 3 mm diameter angioplasty balloon             5.  Percutaneous transluminal angioplasty of the right common femoral artery and proximal SFA with 5 mm diameter Lutonix drug-coated angioplasty balloon             6.  Percutaneous transluminal angioplasty of right popliteal artery and distal SFA with 4 mm diameter by 15 cm length Lutonix drug-coated angioplasty balloon             7.  Viabahn stent placement to the right most distal SFA and above-knee popliteal artery with 6 mm diameter by 15 cm length Viabahn stent for greater than 50% residual stenosis after angioplasty             8.  StarClose closure device left femoral artery   The patient notes improvement in the lower extremity symptoms. No interval shortening of the patient's claudication distance or rest pain symptoms. No new ulcers or wounds have occurred since the last visit.  There have been no significant changes to the patient's overall health care.  No documented history of amaurosis fugax or recent TIA symptoms. There are no recent neurological changes noted. No documented history of DVT, PE or superficial thrombophlebitis. The patient denies recent episodes of angina or shortness of breath.   ABI's Rt=0.93 and Lt=0.81  (previous ABI's Rt=0.74 and  Lt=0.86) Duplex US of the hysterectomy bilateral tibial arteries reveals biphasic waveforms with normal toe waveforms bilaterally.     Review of Systems  Cardiovascular:  Negative for leg swelling.  All other systems reviewed and are negative.      Objective:   Physical Exam Vitals reviewed.  HENT:     Head: Normocephalic.  Cardiovascular:     Rate and Rhythm: Normal rate.     Pulses:          Dorsalis pedis pulses are detected w/ Doppler on the right side and detected w/ Doppler on the left side.       Posterior tibial pulses are detected w/ Doppler on the right side and detected w/ Doppler on the left side.  Pulmonary:     Effort: Pulmonary effort is normal.  Skin:    General: Skin is warm and dry.  Neurological:     Mental Status: He is alert and oriented to person, place, and time.  Psychiatric:        Mood and Affect: Mood normal.        Behavior: Behavior normal.        Thought Content: Thought content normal.  Subjective:    Patient ID: Kevin Huerta, male    DOB: [birth year], 57 y.o.   MRN: [id number] Chief Complaint  Patient presents with   Follow-up    [facility] 4 week with ABI    The patient returns to the office for followup and review status post angiogram with intervention on 11/16/2022.   Procedure: Procedure(s) Performed:             1.  Ultrasound guidance for vascular access left femoral artery             2.  Catheter placement into right common femoral artery from left femoral approach             3.  Aortogram and selective right lower extremity angiogram             4.  Percutaneous transluminal angioplasty of right proximal peroneal artery, tibioperoneal trunk with 3 mm diameter angioplasty balloon             5.  Percutaneous transluminal angioplasty of the right common femoral artery and proximal SFA with 5 mm diameter Lutonix drug-coated angioplasty balloon             6.  Percutaneous transluminal angioplasty of right popliteal artery and distal SFA with 4 mm diameter by 15 cm length Lutonix drug-coated angioplasty balloon             7.  Viabahn stent placement to the right most distal SFA and above-knee popliteal artery with 6 mm diameter by 15 cm length Viabahn stent for greater than 50% residual stenosis after angioplasty             8.  StarClose closure device left femoral artery   The patient notes improvement in the lower extremity symptoms. No interval shortening of the patient's claudication distance or rest pain symptoms. No new ulcers or wounds have occurred since the last visit.  There have been no significant changes to the patient's overall health care.  No documented history of amaurosis fugax or recent TIA symptoms. There are no recent neurological changes noted. No documented history of DVT, PE or superficial thrombophlebitis. The patient denies recent episodes of angina or shortness of breath.   ABI's Rt=0.93 and Lt=0.81  (previous ABI's Rt=0.74 and  Lt=0.86) Duplex US of the hysterectomy bilateral tibial arteries reveals biphasic waveforms with normal toe waveforms bilaterally.     Review of Systems  Cardiovascular:  Negative for leg swelling.  All other systems reviewed and are negative.      Objective:   Physical Exam Vitals reviewed.  HENT:     Head: Normocephalic.  Cardiovascular:     Rate and Rhythm: Normal rate.     Pulses:          Dorsalis pedis pulses are detected w/ Doppler on the right side and detected w/ Doppler on the left side.       Posterior tibial pulses are detected w/ Doppler on the right side and detected w/ Doppler on the left side.  Pulmonary:     Effort: Pulmonary effort is normal.  Skin:    General: Skin is warm and dry.  Neurological:     Mental Status: He is alert and oriented to person, place, and time.  Psychiatric:        Mood and Affect: Mood normal.        Behavior: Behavior normal.        Thought Content: Thought content normal.  Subjective:    Patient ID: Kevin Huerta, male    DOB: [birth year], 57 y.o.   MRN: [id number] Chief Complaint  Patient presents with   Follow-up    [facility] 4 week with ABI    The patient returns to the office for followup and review status post angiogram with intervention on 11/16/2022.   Procedure: Procedure(s) Performed:             1.  Ultrasound guidance for vascular access left femoral artery             2.  Catheter placement into right common femoral artery from left femoral approach             3.  Aortogram and selective right lower extremity angiogram             4.  Percutaneous transluminal angioplasty of right proximal peroneal artery, tibioperoneal trunk with 3 mm diameter angioplasty balloon             5.  Percutaneous transluminal angioplasty of the right common femoral artery and proximal SFA with 5 mm diameter Lutonix drug-coated angioplasty balloon             6.  Percutaneous transluminal angioplasty of right popliteal artery and distal SFA with 4 mm diameter by 15 cm length Lutonix drug-coated angioplasty balloon             7.  Viabahn stent placement to the right most distal SFA and above-knee popliteal artery with 6 mm diameter by 15 cm length Viabahn stent for greater than 50% residual stenosis after angioplasty             8.  StarClose closure device left femoral artery   The patient notes improvement in the lower extremity symptoms. No interval shortening of the patient's claudication distance or rest pain symptoms. No new ulcers or wounds have occurred since the last visit.  There have been no significant changes to the patient's overall health care.  No documented history of amaurosis fugax or recent TIA symptoms. There are no recent neurological changes noted. No documented history of DVT, PE or superficial thrombophlebitis. The patient denies recent episodes of angina or shortness of breath.   ABI's Rt=0.93 and Lt=0.81  (previous ABI's Rt=0.74 and  Lt=0.86) Duplex US of the hysterectomy bilateral tibial arteries reveals biphasic waveforms with normal toe waveforms bilaterally.     Review of Systems  Cardiovascular:  Negative for leg swelling.  All other systems reviewed and are negative.      Objective:   Physical Exam Vitals reviewed.  HENT:     Head: Normocephalic.  Cardiovascular:     Rate and Rhythm: Normal rate.     Pulses:          Dorsalis pedis pulses are detected w/ Doppler on the right side and detected w/ Doppler on the left side.       Posterior tibial pulses are detected w/ Doppler on the right side and detected w/ Doppler on the left side.  Pulmonary:     Effort: Pulmonary effort is normal.  Skin:    General: Skin is warm and dry.  Neurological:     Mental Status: He is alert and oriented to person, place, and time.  Psychiatric:        Mood and Affect: Mood normal.        Behavior: Behavior normal.        Thought Content: Thought content normal.  Subjective:    Patient ID: Kevin Huerta, male    DOB: [birth year], 57 y.o.   MRN: [id number] Chief Complaint  Patient presents with   Follow-up    [facility] 4 week with ABI    The patient returns to the office for followup and review status post angiogram with intervention on 11/16/2022.   Procedure: Procedure(s) Performed:             1.  Ultrasound guidance for vascular access left femoral artery             2.  Catheter placement into right common femoral artery from left femoral approach             3.  Aortogram and selective right lower extremity angiogram             4.  Percutaneous transluminal angioplasty of right proximal peroneal artery, tibioperoneal trunk with 3 mm diameter angioplasty balloon             5.  Percutaneous transluminal angioplasty of the right common femoral artery and proximal SFA with 5 mm diameter Lutonix drug-coated angioplasty balloon             6.  Percutaneous transluminal angioplasty of right popliteal artery and distal SFA with 4 mm diameter by 15 cm length Lutonix drug-coated angioplasty balloon             7.  Viabahn stent placement to the right most distal SFA and above-knee popliteal artery with 6 mm diameter by 15 cm length Viabahn stent for greater than 50% residual stenosis after angioplasty             8.  StarClose closure device left femoral artery   The patient notes improvement in the lower extremity symptoms. No interval shortening of the patient's claudication distance or rest pain symptoms. No new ulcers or wounds have occurred since the last visit.  There have been no significant changes to the patient's overall health care.  No documented history of amaurosis fugax or recent TIA symptoms. There are no recent neurological changes noted. No documented history of DVT, PE or superficial thrombophlebitis. The patient denies recent episodes of angina or shortness of breath.   ABI's Rt=0.93 and Lt=0.81  (previous ABI's Rt=0.74 and  Lt=0.86) Duplex US of the hysterectomy bilateral tibial arteries reveals biphasic waveforms with normal toe waveforms bilaterally.     Review of Systems  Cardiovascular:  Negative for leg swelling.  All other systems reviewed and are negative.      Objective:   Physical Exam Vitals reviewed.  HENT:     Head: Normocephalic.  Cardiovascular:     Rate and Rhythm: Normal rate.     Pulses:          Dorsalis pedis pulses are detected w/ Doppler on the right side and detected w/ Doppler on the left side.       Posterior tibial pulses are detected w/ Doppler on the right side and detected w/ Doppler on the left side.  Pulmonary:     Effort: Pulmonary effort is normal.  Skin:    General: Skin is warm and dry.  Neurological:     Mental Status: He is alert and oriented to person, place, and time.  Psychiatric:        Mood and Affect: Mood normal.        Behavior: Behavior normal.        Thought Content: Thought content normal.

## 2022-12-21 LAB — VAS US ABI WITH/WO TBI
Left ABI: 0.81
Right ABI: 0.93

## 2022-12-23 ENCOUNTER — Other Ambulatory Visit: Payer: Self-pay | Admitting: Family Medicine

## 2022-12-23 DIAGNOSIS — R0602 Shortness of breath: Secondary | ICD-10-CM

## 2022-12-24 ENCOUNTER — Ambulatory Visit: Payer: 59 | Admitting: Family Medicine

## 2023-01-22 ENCOUNTER — Other Ambulatory Visit: Payer: Self-pay | Admitting: Family Medicine

## 2023-01-22 DIAGNOSIS — E1159 Type 2 diabetes mellitus with other circulatory complications: Secondary | ICD-10-CM

## 2023-01-22 NOTE — Telephone Encounter (Signed)
Mychart msg sent to pt informing him to make an appointment for his next refill

## 2023-01-26 ENCOUNTER — Other Ambulatory Visit: Payer: Self-pay | Admitting: Acute Care

## 2023-01-26 DIAGNOSIS — Z87891 Personal history of nicotine dependence: Secondary | ICD-10-CM

## 2023-01-26 DIAGNOSIS — Z122 Encounter for screening for malignant neoplasm of respiratory organs: Secondary | ICD-10-CM

## 2023-02-04 ENCOUNTER — Encounter: Payer: Self-pay | Admitting: Pulmonary Disease

## 2023-02-04 ENCOUNTER — Ambulatory Visit: Admission: RE | Admit: 2023-02-04 | Payer: 59 | Source: Ambulatory Visit

## 2023-02-04 ENCOUNTER — Ambulatory Visit: Payer: 59 | Admitting: Pulmonary Disease

## 2023-02-04 VITALS — BP 130/70 | HR 94 | Temp 98.0°F | Ht 69.5 in | Wt 239.2 lb

## 2023-02-04 DIAGNOSIS — R0609 Other forms of dyspnea: Secondary | ICD-10-CM

## 2023-02-04 DIAGNOSIS — G4733 Obstructive sleep apnea (adult) (pediatric): Secondary | ICD-10-CM | POA: Diagnosis not present

## 2023-02-04 LAB — POCT EXHALED NITRIC OXIDE: FeNO level (ppb): 62

## 2023-02-04 MED ORDER — ARNUITY ELLIPTA 100 MCG/ACT IN AEPB: 1.0000 | INHALATION_SPRAY | Freq: Every day | RESPIRATORY_TRACT | 5 refills | Status: DC

## 2023-02-04 NOTE — Progress Notes (Signed)
Patient seen in the office today and instructed on use of Arnuity.  Patient expressed understanding and demonstrated technique. 

## 2023-02-04 NOTE — Patient Instructions (Addendum)
Arnuity one puff daily, and rinse your mouth after each use  Albuterol two puffs every 6 hours as needed for cough, wheeze, shortness of breath or chest congestion  Will schedule pulmonary function test and home sleep study, and arrange for follow up appointment after these are done

## 2023-02-04 NOTE — Progress Notes (Signed)
[facility]  Chief Complaint  Patient presents with   Consult    Past Surgical History:  He  has a past surgical history that includes Rotator cuff repair; Knee surgery (Right); Wisdom tooth extraction; Cervical disc surgery; Other surgical history (11/13/2022); and Lower Extremity Angiography (Right, 11/16/2022).  Past Medical History:  DM type 2, Fatty liver, HLD, HTN, Gout  Constitutional:  BP 130/70 (BP Location: Left Arm, Patient Position: Sitting, Cuff Size: Normal)   Pulse 94   Temp 98 F (36.7 C) (Oral)   Ht 5' 9.5" (1.765 m)   Wt 239 lb 3.2 oz (108.5 kg)   SpO2 96%   BMI 34.82 kg/m   Brief Summary:  Kevin Huerta is a 57 y.o. male with shortness of breath and sleep apnea.      Subjective:   He was seen previously by Dr. Wynona Neat.  Home sleep study from 2021 showed mild sleep apnea.  He tried an oral appliance, but wasn't able to tolerate this.  He continues to have snoring and sleep disruption.  He stops breathing sometimes at night.  He feels tired during the day.  His Epworth score is 7 out of 24.  He would be willing to try CPAP if needed.  He has noticed more trouble with his breathing since December 2023.  No specific event triggered this. He gets episodes of wheezing intermittently.  Not much cough or congestion.  He does get seasonal allergies.  He had asthma as a child.  He was recently started on albuterol again and this seems to help when he uses it.  He had COVID in 2021.  He had pneumonia several years ago.  He works in Consulting civil engineer.  He is from [place].  He smoked 1.5 ppd and quit about 4 years ago.  His father has asthma, and his aunt has COPD.  Physical Exam:   Appearance - well kempt   ENMT - no sinus tenderness, no oral exudate, no LAN, Mallampati 3 airway, no stridor  Respiratory - equal breath sounds bilaterally, no wheezing or rales  CV - s1s2 regular rate and rhythm, no murmurs  Ext - no clubbing,  no edema  Skin - no rashes  Psych - normal mood and affect   Pulmonary testing:  FeNO 02/04/23 >> 62  Chest Imaging:  LDCT chest 02/02/22 >> coronary calcification, mild centrilobular emphysema, b/l calcified granulomas  Sleep Tests:  HST 08/22/20 >> AHI 7.9, SpO2 low 82%  Cardiac Tests:    Social History:  He  reports that he quit smoking about 4 years ago. His smoking use included cigarettes. He has a 48.00 pack-year smoking history. He quit smokeless tobacco use about 28 years ago.  His smokeless tobacco use included snuff. He reports current alcohol use. He reports that he does not currently use drugs after having used the following drugs: Marijuana.  Family History:  His family history includes Alcohol abuse in his father; Drug abuse in his father; Heart attack (age of onset: 87) in his father; Heart attack (age of onset: 71) in his maternal grandmother; Heart disease in his father; Hypertension in his mother; Lung cancer in his father; Stroke (age of onset: 12) in his maternal aunt.     Assessment/Plan:   Dyspnea on exertion. - with history of asthma and tobacco abuse, CT imaging showing emphysema and coronary calcification, and family history of obstructive lung disease - elevated FeNO would indicate that asthma is playing a role  with his symptoms - will arrange for pulmonary function test - advised him to follow up with cardiology - will have him start arnuity 100 one puff daily - continue albuterol prn   Obstructive sleep apnea. - he has snoring, sleep disruption, apnea, and daytime sleepiness - he has a history of hypertension - will arrange for a home sleep study to assess current status of sleep apnea  Time Spent Involved in Patient Care on Day of Examination:  52 minutes  Follow up:   Patient Instructions  Arnuity one puff daily, and rinse your mouth after each use  Albuterol two puffs every 6 hours as needed for cough, wheeze, shortness of breath or chest  congestion  Will schedule pulmonary function test and home sleep study, and arrange for follow up appointment after these are done  Medication List:   Allergies as of 02/04/2023       Reactions   Aspirin         Medication List        Accurate as of Feb 04, 2023 10:36 AM. If you have any questions, ask your nurse or doctor.          STOP taking these medications    aspirin EC 81 MG tablet Stopped by: Coralyn Helling, MD   buPROPion 150 MG 24 hr tablet Commonly known as: WELLBUTRIN XL Stopped by: Coralyn Helling, MD       TAKE these medications    albuterol 108 (90 Base) MCG/ACT inhaler Commonly known as: VENTOLIN HFA USE 2 INHALATIONS EVERY 6 HOURS AS NEEDED FOR WHEEZING OR SHORTNESS OF BREATH   allopurinol 100 MG tablet Commonly known as: ZYLOPRIM Take 200 mg by mouth daily.   ALPRAZolam 0.5 MG tablet Commonly known as: XANAX Take 0.5 mg by mouth as needed.   Arnuity Ellipta 100 MCG/ACT Aepb Generic drug: Fluticasone Furoate Inhale 1 puff into the lungs daily in the afternoon. Started by: Coralyn Helling, MD   atorvastatin 80 MG tablet Commonly known as: LIPITOR Take 1 tablet (80 mg total) by mouth daily.   cetirizine 10 MG tablet Commonly known as: ZYRTEC Take 10 mg by mouth daily.   clopidogrel 75 MG tablet Commonly known as: Plavix Take 1 tablet (75 mg total) by mouth daily.   clotrimazole-betamethasone cream Commonly known as: LOTRISONE Apply 1 Application topically daily.   colchicine 0.6 MG tablet Take 0.6 mg by mouth as needed.   diazepam 5 MG tablet Commonly known as: VALIUM 1-2 30 minutes before ESI   fluticasone 50 MCG/ACT nasal spray Commonly known as: FLONASE Place 2 sprays into both nostrils daily.   FreeStyle Libre 2 Sensor Misc USE TO CHECK GLUCOSE THREE TIMES A DAY   gabapentin 300 MG capsule Commonly known as: NEURONTIN Take 300 mg by mouth 2 (two) times daily.   glipiZIDE 10 MG 24 hr tablet Commonly known as: GLUCOTROL  XL Take 10 mg by mouth.   ipratropium 0.06 % nasal spray Commonly known as: ATROVENT Place 2 sprays into both nostrils 4 (four) times daily.   ketoconazole 2 % cream Commonly known as: NIZORAL APPLY TOPICALLY TWICE A DAY AS NEEDED   Melatonin 10 MG Caps Take 10 mg by mouth every evening.   Multi-Vitamin tablet Take by mouth.   omeprazole 40 MG capsule Commonly known as: PRILOSEC Take 1 capsule (40 mg total) by mouth in the morning and at bedtime.   OneTouch Delica Lancets 33G Misc by Does not apply route.   OneTouch Verio Flex  System w/Device Kit by Does not apply route.   OneTouch Verio test strip Generic drug: glucose blood   Ozempic (2 MG/DOSE) 8 MG/3ML Sopn Generic drug: Semaglutide (2 MG/DOSE) Inject into the skin.   pioglitazone 15 MG tablet Commonly known as: ACTOS Take 15 mg by mouth daily.   Synjardy XR 12.02-999 MG Tb24 Generic drug: Empagliflozin-metFORMIN HCl ER Take by mouth.   traZODone 100 MG tablet Commonly known as: DESYREL Take 100 mg by mouth at bedtime.   valsartan 320 MG tablet Commonly known as: DIOVAN TAKE 1 TABLET BY MOUTH EVERY DAY        Signature:  Coralyn Helling, MD [facility] Pager - [phone] 02/04/2023, 10:36 AM

## 2023-02-18 ENCOUNTER — Other Ambulatory Visit: Payer: Self-pay | Admitting: Family Medicine

## 2023-02-18 ENCOUNTER — Ambulatory Visit: Payer: 59 | Attending: Pulmonary Disease

## 2023-02-18 ENCOUNTER — Ambulatory Visit: Payer: 59 | Admitting: Podiatry

## 2023-02-18 DIAGNOSIS — Z87891 Personal history of nicotine dependence: Secondary | ICD-10-CM | POA: Insufficient documentation

## 2023-02-18 DIAGNOSIS — R0600 Dyspnea, unspecified: Secondary | ICD-10-CM | POA: Insufficient documentation

## 2023-02-18 DIAGNOSIS — R0609 Other forms of dyspnea: Secondary | ICD-10-CM | POA: Diagnosis not present

## 2023-02-18 DIAGNOSIS — E1159 Type 2 diabetes mellitus with other circulatory complications: Secondary | ICD-10-CM

## 2023-02-18 LAB — PULMONARY FUNCTION TEST ARMC ONLY
DL/VA % pred: 123 %
DL/VA: 5.34 ml/min/mmHg/L
DLCO unc % pred: 126 %
DLCO unc: 34.75 ml/min/mmHg
FEF 25-75 Post: 4.39 L/sec
FEF 25-75 Pre: 4.18 L/sec
FEF2575-%Change-Post: 5 %
FEF2575-%Pred-Post: 142 %
FEF2575-%Pred-Pre: 135 %
FEV1-%Change-Post: 2 %
FEV1-%Pred-Post: 93 %
FEV1-%Pred-Pre: 91 %
FEV1-Post: 3.38 L
FEV1-Pre: 3.31 L
FEV1FVC-%Change-Post: -2 %
FEV1FVC-%Pred-Pre: 109 %
FEV6-%Change-Post: 5 %
FEV6-%Pred-Post: 90 %
FEV6-%Pred-Pre: 86 %
FEV6-Post: 4.12 L
FEV6-Pre: 3.91 L
FEV6FVC-%Change-Post: 0 %
FEV6FVC-%Pred-Post: 104 %
FEV6FVC-%Pred-Pre: 103 %
FVC-%Change-Post: 4 %
FVC-%Pred-Post: 87 %
FVC-%Pred-Pre: 83 %
FVC-Post: 4.12 L
FVC-Pre: 3.95 L
Post FEV1/FVC ratio: 82 %
Post FEV6/FVC ratio: 100 %
Pre FEV1/FVC ratio: 84 %
Pre FEV6/FVC Ratio: 100 %
RV % pred: 129 %
RV: 2.73 L
TLC % pred: 102 %
TLC: 6.97 L

## 2023-02-18 MED ORDER — ALBUTEROL SULFATE (2.5 MG/3ML) 0.083% IN NEBU
2.5000 mg | INHALATION_SOLUTION | Freq: Once | RESPIRATORY_TRACT | Status: AC
  Administered 2023-02-18: 2.5 mg via RESPIRATORY_TRACT
  Filled 2023-02-18: qty 3

## 2023-02-22 ENCOUNTER — Other Ambulatory Visit: Payer: Self-pay | Admitting: Internal Medicine

## 2023-02-22 DIAGNOSIS — I2089 Other forms of angina pectoris: Secondary | ICD-10-CM

## 2023-02-22 DIAGNOSIS — I25118 Atherosclerotic heart disease of native coronary artery with other forms of angina pectoris: Secondary | ICD-10-CM

## 2023-02-25 ENCOUNTER — Ambulatory Visit: Payer: 59 | Admitting: Podiatry

## 2023-02-25 ENCOUNTER — Encounter: Payer: Self-pay | Admitting: Podiatry

## 2023-02-25 VITALS — BP 124/71 | HR 77

## 2023-02-25 DIAGNOSIS — M79675 Pain in left toe(s): Secondary | ICD-10-CM

## 2023-02-25 DIAGNOSIS — B351 Tinea unguium: Secondary | ICD-10-CM | POA: Diagnosis not present

## 2023-02-25 DIAGNOSIS — B353 Tinea pedis: Secondary | ICD-10-CM | POA: Diagnosis not present

## 2023-02-25 DIAGNOSIS — M79674 Pain in right toe(s): Secondary | ICD-10-CM | POA: Diagnosis not present

## 2023-02-25 DIAGNOSIS — L309 Dermatitis, unspecified: Secondary | ICD-10-CM

## 2023-02-25 NOTE — Progress Notes (Signed)
This patient returns to my office for at risk foot care.  This patient requires this care by a professional since this patient will be at risk due to having diabetes.  This patient is unable to cut nails himself since the patient cannot reach his nails.These nails are painful walking and wearing shoes.   This patient presents for at risk foot care today.  General Appearance  Alert, conversant and in no acute stress.  Vascular  Dorsalis pedis and posterior tibial  pulses are palpable  bilaterally.  Capillary return is within normal limits  bilaterally. Temperature is within normal limits  bilaterally.  Neurologic  Senn-Weinstein monofilament wire test within normal limits  bilaterally. Muscle power within normal limits bilaterally.  Nails Thick disfigured discolored nails with subungual debris  from hallux to fifth toes bilaterally. No evidence of bacterial infection or drainage bilaterally.  Orthopedic  No limitations of motion  feet .  No crepitus or effusions noted.  No digital deformities noted.  HAV  B/L.  Skin  normotropic skin with no porokeratosis noted bilaterally.  No signs of infections or ulcers noted.     Onychomycosis  Pain in right toes  Pain in left toes  Consent was obtained for treatment procedures.   Mechanical debridement of nails 1-5  bilaterally performed with a nail nipper.  Filed with dremel without incident.   Dermatitis/athlete's foot -Patient will benefit from Lotrisone cream Lotrisone cream was dispensed last on the 5 twice a day he states understanding will be self   Return office visit   4 months                   Told patient to return for periodic foot care and evaluation due to potential at risk complications.    Nicholes Rough D.P.M.'s

## 2023-02-26 ENCOUNTER — Telehealth: Payer: Self-pay | Admitting: Podiatry

## 2023-02-26 NOTE — Telephone Encounter (Signed)
Patient's wife called with 2 concerns:  1) He was seen yesterday, and comes regularly for RFC, and is saying his foot was cut at the appointment.  "Why did this happen and what do we do for it?"  2) A cream for his skin was supposed to be sent to his pharmacy (looks like Lotrisone from your note), but the pharmacy doesn't have the order for it.  Could you please send the Rx today?  Her call back number is # [phone].  -Kevin Huerta

## 2023-03-02 ENCOUNTER — Telehealth: Payer: Self-pay | Admitting: Pulmonary Disease

## 2023-03-02 MED ORDER — CLOTRIMAZOLE-BETAMETHASONE 1-0.05 % EX CREA: 1.0000 | TOPICAL_CREAM | Freq: Every day | CUTANEOUS | 0 refills | Status: DC

## 2023-03-02 MED ORDER — ARNUITY ELLIPTA 100 MCG/ACT IN AEPB: 1.0000 | INHALATION_SPRAY | Freq: Every day | RESPIRATORY_TRACT | 2 refills | Status: DC

## 2023-03-02 NOTE — Telephone Encounter (Signed)
Pt states insurance is requesting an RX giving 3 mo supply at a time of his arnuity inhaler. Please send in to [facility]

## 2023-03-02 NOTE — Addendum Note (Signed)
Addended by: Nicholes Rough on: 03/02/2023 07:53 AM   Modules accepted: Orders

## 2023-03-02 NOTE — Telephone Encounter (Signed)
90 day supply of Arnuity has been sent to preferred pharmacy. Patient is aware and voiced his understanding.  Nothing further needed.

## 2023-03-10 ENCOUNTER — Other Ambulatory Visit (INDEPENDENT_AMBULATORY_CARE_PROVIDER_SITE_OTHER): Payer: Self-pay | Admitting: Nurse Practitioner

## 2023-03-10 DIAGNOSIS — I739 Peripheral vascular disease, unspecified: Secondary | ICD-10-CM

## 2023-03-11 ENCOUNTER — Telehealth: Payer: Self-pay | Admitting: Podiatry

## 2023-03-11 NOTE — Telephone Encounter (Signed)
Pts wife called stating she had left a message last week and no one returned call. She said there was to be some cream that was to have been sent in and they have not heard back.  Upon checking the cream Lotrisone was sent in on 5.28. I did confirm that the pharmacy was correct and she is calling the pharmacy. She said they never notified her the medication was sent in.. She also asked about who pt is to see next visit as Dr Stacie Acres was his provider and he is no longer there. I looked and pt is seeing Dr Eloy End. She did not want pt to see Dr Allena Katz as he cut pts foot when trimming nails. I apologized and explained that Dr Eloy End does only the diabetic foot care and routine care and she said ok and good.

## 2023-03-15 ENCOUNTER — Encounter (INDEPENDENT_AMBULATORY_CARE_PROVIDER_SITE_OTHER): Payer: Self-pay | Admitting: Nurse Practitioner

## 2023-03-15 ENCOUNTER — Ambulatory Visit (INDEPENDENT_AMBULATORY_CARE_PROVIDER_SITE_OTHER): Payer: 59

## 2023-03-15 ENCOUNTER — Ambulatory Visit (INDEPENDENT_AMBULATORY_CARE_PROVIDER_SITE_OTHER): Payer: 59 | Admitting: Nurse Practitioner

## 2023-03-15 VITALS — BP 124/81 | HR 77 | Resp 16 | Wt 237.0 lb

## 2023-03-15 DIAGNOSIS — E785 Hyperlipidemia, unspecified: Secondary | ICD-10-CM

## 2023-03-15 DIAGNOSIS — E119 Type 2 diabetes mellitus without complications: Secondary | ICD-10-CM

## 2023-03-15 DIAGNOSIS — I739 Peripheral vascular disease, unspecified: Secondary | ICD-10-CM

## 2023-03-15 DIAGNOSIS — I70213 Atherosclerosis of native arteries of extremities with intermittent claudication, bilateral legs: Secondary | ICD-10-CM | POA: Diagnosis not present

## 2023-03-15 DIAGNOSIS — Z9889 Other specified postprocedural states: Secondary | ICD-10-CM | POA: Diagnosis not present

## 2023-03-15 DIAGNOSIS — E1169 Type 2 diabetes mellitus with other specified complication: Secondary | ICD-10-CM

## 2023-03-15 NOTE — Progress Notes (Signed)
Subjective:    Patient ID: Kevin Huerta, male    DOB: 03-26-1966, 57 y.o.   MRN: [id number] Chief Complaint  Patient presents with   Follow-up    Ultrasound follow up    Kevin Huerta is a 57 year old male that returns to the office for followup and review of the noninvasive studies.  The patient has had an angiogram on his right lower extremity on 11/16/2022.  He notes that his right lower extremity is doing very well and he is not having any claudication symptoms like he was having previously.  He maintains on Plavix but had to stop aspirin due to tinnitus  The patient notes that there has been a significant deterioration in the lower extremity symptoms on the left lower extremity.  The patient notes interval shortening of their claudication distance and development of mild rest pain symptoms. No new ulcers or wounds have occurred since the last visit.    There have been no significant changes to the patient's overall health care.  The patient denies amaurosis fugax or recent TIA symptoms. There are no recent neurological changes noted. There is no history of DVT, PE or superficial thrombophlebitis. The patient denies recent episodes of angina or shortness of breath.   ABI's Rt=1.02 and Lt=0.93 (previous ABI's Rt=0.93 and Lt=0.81) Duplex US of the lower extremity arterial system shows triphasic/biphasic waveforms throughout the right lower extremity.  The left lower extremity has triphasic/biphasic down to the mid SFA where it is occluded.  He regains flow via collateral but there is monophasic flow in the distal down to the tibial vessels of lower extremity.    Review of Systems  Cardiovascular:        Claudication  All other systems reviewed and are negative.      Objective:   Physical Exam Vitals reviewed.  HENT:     Head: Normocephalic.  Cardiovascular:     Rate and Rhythm: Normal rate.     Pulses:          Dorsalis pedis pulses are detected w/ Doppler on the right  side and detected w/ Doppler on the left side.       Posterior tibial pulses are detected w/ Doppler on the right side and detected w/ Doppler on the left side.  Pulmonary:     Effort: Pulmonary effort is normal.  Skin:    General: Skin is warm and dry.  Neurological:     Mental Status: He is alert and oriented to person, place, and time.  Psychiatric:        Mood and Affect: Mood normal.        Behavior: Behavior normal.        Thought Content: Thought content normal.        Judgment: Judgment normal.     BP 124/81 (BP Location: Left Arm)   Pulse 77   Resp 16   Wt 237 lb (107.5 kg)   BMI 34.50 kg/m   Past Medical History:  Diagnosis Date   Asthma    BRBPR (bright red blood per rectum) 05/24/2020   Chest heaviness 10/27/2022   Dermatitis 06/02/2021   Diabetes mellitus without complication (HCC)    Diarrhea 02/26/2021   Fatty liver 09/03/2020   Heart murmur    Hyperlipidemia    Hypersomnia 07/17/2014   Hypertension    Leg cramps 02/26/2021    Social History   Socioeconomic History   Marital status: Married    Spouse name: leslie   Number of  children: 3   Years of education: Not on file   Highest education level: Not on file  Occupational History   Occupation: IT   Tobacco Use   Smoking status: Some Days    Packs/day: 1.50    Years: 32.00    Additional pack years: 0.00    Total pack years: 48.00    Types: Cigarettes, E-cigarettes    Last attempt to quit: 06/11/2018    Years since quitting: 4.7   Smokeless tobacco: Former    Types: Snuff    Quit date: 1996   Tobacco comments:    I vape weekly."  Vaping Use   Vaping Use: Some days   Devices: x3 per day to quit smoking nicotine   Substance and Sexual Activity   Alcohol use: Yes    Comment: cut back, 6 pack per month   Drug use: Not Currently    Types: Marijuana    Comment: a few times a year   Sexual activity: Yes    Partners: Female    Birth control/protection: None  Other Topics Concern   Not on  file  Social History Narrative   10/15/20   From: [place] originally, but in the area since 2000   Living: with wife, Verlon Au (2003) and granddaughter   Work: Scientist, 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[employer]      Family: 3 daughters - Irish Lack, Toni Amend (just finished law school), [place], and granddaughter McKenzie (2011)      Enjoys: travel when able, beach, cycling      Exercise: exercise bike at home   Diet: does not follow diabetic diet, lower appetite with ozempic      Safety   Seat belts: Yes    Guns: Yes  and secure   Safe in relationships: Yes    Social Determinants of Health   Financial Resource Strain: Low Risk  (10/07/2020)   Overall Financial Resource Strain (CARDIA)    Difficulty of Paying Living Expenses: Not hard at all  Food Insecurity: Not on file  Transportation Needs: Not on file  Physical Activity: Not on file  Stress: Not on file  Social Connections: Not on file  Intimate Partner Violence: Not on file    Past Surgical History:  Procedure Laterality Date   CERVICAL DISC SURGERY     fusion   KNEE SURGERY Right    LOWER EXTREMITY ANGIOGRAPHY Right 11/16/2022   Procedure: Lower Extremity Angiography;  Surgeon: Annice Needy, MD;  Location: [facility];  Service: Cardiovascular;  Laterality: Right;   OTHER SURGICAL HISTORY  11/13/2022   steroid injection to lumbar spine   ROTATOR CUFF REPAIR     WISDOM TOOTH EXTRACTION      Family History  Problem Relation Age of Onset   Heart attack Father 41   Alcohol abuse Father    Drug abuse Father    Heart disease Father    Lung cancer Father    Hypertension Mother    Heart attack Maternal Grandmother 92   Stroke Maternal Aunt 20    Allergies  Allergen Reactions   Aspirin        Latest Ref Rng & Units 10/27/2022    9:48 AM 02/26/2021   10:02 AM 09/03/2020    4:08 PM  CBC  WBC 4.0 - 10.5 K/uL 4.5  5.2  6.0   Hemoglobin 13.0 - 17.0 g/dL 62.1  30.8  65.7   Hematocrit 39.0 - 52.0 % 47.2  43.6  42.9   Platelets  150.0 - 400.0 K/uL 300.0  291.0  335.0       CMP     Component Value Date/Time   NA 140 10/27/2022 0948   K 4.2 10/27/2022 0948   CL 102 10/27/2022 0948   CO2 27 10/27/2022 0948   GLUCOSE 181 (H) 10/27/2022 0948   BUN 16 11/16/2022 0752   CREATININE 0.87 11/16/2022 0752   CALCIUM 10.4 10/27/2022 0948   PROT 7.7 10/27/2022 0948   ALBUMIN 5.1 10/27/2022 0948   AST 22 10/27/2022 0948   ALT 35 10/27/2022 0948   ALKPHOS 81 10/27/2022 0948   BILITOT 0.3 10/27/2022 0948   GFR 83.22 10/27/2022 0948   EGFR 7.9 02/17/2022 0000   GFRNONAA >60 11/16/2022 0752     No results found.     Assessment & Plan:   1. Atherosclerosis of native artery of both lower extremities with intermittent claudication (HCC) Recommend:  The patient has experienced increased claudication symptoms and is now describing lifestyle limiting claudication and appears to be having mild rest pain symptroms.  Given the severity of the patient's severe left lower extremity symptoms the patient should undergo angiography with the hope for intervention.  Risk and benefits were reviewed the patient.  Indications for the procedure were reviewed.  All questions were answered, the patient agrees to proceed with left lower extremity angiography and possible intervention.   The patient should continue walking and begin a more formal exercise program.  The patient should continue antiplatelet therapy and aggressive treatment of the lipid abnormalities  The patient will follow up with me after the angiogram.   2. Hyperlipidemia associated with type 2 diabetes mellitus (HCC) Continue statin as ordered and reviewed, no changes at this time  3. Type 2 diabetes mellitus without complication, without long-term current use of insulin (HCC) Continue hypoglycemic medications as already ordered, these medications have been reviewed and there are no changes at this time.  Hgb A1C to be monitored as already arranged by primary  service   Current Outpatient Medications on File Prior to Visit  Medication Sig Dispense Refill   albuterol (VENTOLIN HFA) 108 (90 Base) MCG/ACT inhaler USE 2 INHALATIONS EVERY 6 HOURS AS NEEDED FOR WHEEZING OR SHORTNESS OF BREATH 8.5 g 10   allopurinol (ZYLOPRIM) 100 MG tablet Take 200 mg by mouth daily.     ALPRAZolam (XANAX) 0.5 MG tablet Take 0.5 mg by mouth as needed.     atorvastatin (LIPITOR) 80 MG tablet Take 1 tablet (80 mg total) by mouth daily. 90 tablet 3   Blood Glucose Monitoring Suppl (ONETOUCH VERIO FLEX SYSTEM) w/Device KIT by Does not apply route.     cetirizine (ZYRTEC) 10 MG tablet Take 10 mg by mouth daily.     clopidogrel (PLAVIX) 75 MG tablet Take 1 tablet (75 mg total) by mouth daily. 30 tablet 11   clotrimazole-betamethasone (LOTRISONE) cream Apply 1 Application topically daily. 30 g 0   clotrimazole-betamethasone (LOTRISONE) cream Apply 1 Application topically daily. 30 g 0   colchicine 0.6 MG tablet Take 0.6 mg by mouth as needed.     Continuous Blood Gluc Sensor (FREESTYLE LIBRE 2 SENSOR) MISC USE TO CHECK GLUCOSE THREE TIMES A DAY 2 each 12   diazepam (VALIUM) 5 MG tablet 1-2 30 minutes before ESI     fluticasone (FLONASE) 50 MCG/ACT nasal spray Place 2 sprays into both nostrils daily. 16 g 6   Fluticasone Furoate (ARNUITY ELLIPTA) 100 MCG/ACT AEPB Inhale 1 puff into the lungs daily in  the afternoon. 180 each 2   gabapentin (NEURONTIN) 300 MG capsule Take 300 mg by mouth 2 (two) times daily.     glipiZIDE (GLUCOTROL XL) 10 MG 24 hr tablet Take 10 mg by mouth.     ipratropium (ATROVENT) 0.06 % nasal spray Place 2 sprays into both nostrils 4 (four) times daily. 15 mL 12   ketoconazole (NIZORAL) 2 % cream APPLY TOPICALLY TWICE A DAY AS NEEDED     Melatonin 10 MG CAPS Take 10 mg by mouth every evening.     Multiple Vitamin (MULTI-VITAMIN) tablet Take by mouth.     omeprazole (PRILOSEC) 40 MG capsule Take 1 capsule (40 mg total) by mouth in the morning and at bedtime.  180 capsule 1   OneTouch Delica Lancets 33G MISC by Does not apply route.     ONETOUCH VERIO test strip      pioglitazone (ACTOS) 15 MG tablet Take 15 mg by mouth daily.     Semaglutide, 2 MG/DOSE, (OZEMPIC, 2 MG/DOSE,) 8 MG/3ML SOPN Inject into the skin.     SYNJARDY XR 12.02-999 MG TB24 Take by mouth.     traZODone (DESYREL) 100 MG tablet Take 100 mg by mouth at bedtime.     valsartan (DIOVAN) 320 MG tablet TAKE 1 TABLET BY MOUTH EVERY DAY 90 tablet 3   No current facility-administered medications on file prior to visit.    There are no Patient Instructions on file for this visit. No follow-ups on file.   Georgiana Spinner, NP

## 2023-03-15 NOTE — H&P (View-Only) (Signed)
Subjective:    Patient ID: Kevin Huerta, male    DOB: 03-26-1966, 57 y.o.   MRN: [id number] Chief Complaint  Patient presents with   Follow-up    Ultrasound follow up    Kevin Huerta is a 57 year old male that returns to the office for followup and review of the noninvasive studies.  The patient has had an angiogram on his right lower extremity on 11/16/2022.  He notes that his right lower extremity is doing very well and he is not having any claudication symptoms like he was having previously.  He maintains on Plavix but had to stop aspirin due to tinnitus  The patient notes that there has been a significant deterioration in the lower extremity symptoms on the left lower extremity.  The patient notes interval shortening of their claudication distance and development of mild rest pain symptoms. No new ulcers or wounds have occurred since the last visit.    There have been no significant changes to the patient's overall health care.  The patient denies amaurosis fugax or recent TIA symptoms. There are no recent neurological changes noted. There is no history of DVT, PE or superficial thrombophlebitis. The patient denies recent episodes of angina or shortness of breath.   ABI's Rt=1.02 and Lt=0.93 (previous ABI's Rt=0.93 and Lt=0.81) Duplex US of the lower extremity arterial system shows triphasic/biphasic waveforms throughout the right lower extremity.  The left lower extremity has triphasic/biphasic down to the mid SFA where it is occluded.  He regains flow via collateral but there is monophasic flow in the distal down to the tibial vessels of lower extremity.    Review of Systems  Cardiovascular:        Claudication  All other systems reviewed and are negative.      Objective:   Physical Exam Vitals reviewed.  HENT:     Head: Normocephalic.  Cardiovascular:     Rate and Rhythm: Normal rate.     Pulses:          Dorsalis pedis pulses are detected w/ Doppler on the right  side and detected w/ Doppler on the left side.       Posterior tibial pulses are detected w/ Doppler on the right side and detected w/ Doppler on the left side.  Pulmonary:     Effort: Pulmonary effort is normal.  Skin:    General: Skin is warm and dry.  Neurological:     Mental Status: He is alert and oriented to person, place, and time.  Psychiatric:        Mood and Affect: Mood normal.        Behavior: Behavior normal.        Thought Content: Thought content normal.        Judgment: Judgment normal.     BP 124/81 (BP Location: Left Arm)   Pulse 77   Resp 16   Wt 237 lb (107.5 kg)   BMI 34.50 kg/m   Past Medical History:  Diagnosis Date   Asthma    BRBPR (bright red blood per rectum) 05/24/2020   Chest heaviness 10/27/2022   Dermatitis 06/02/2021   Diabetes mellitus without complication (HCC)    Diarrhea 02/26/2021   Fatty liver 09/03/2020   Heart murmur    Hyperlipidemia    Hypersomnia 07/17/2014   Hypertension    Leg cramps 02/26/2021    Social History   Socioeconomic History   Marital status: Married    Spouse name: leslie   Number of  children: 3   Years of education: Not on file   Highest education level: Not on file  Occupational History   Occupation: IT   Tobacco Use   Smoking status: Some Days    Packs/day: 1.50    Years: 32.00    Additional pack years: 0.00    Total pack years: 48.00    Types: Cigarettes, E-cigarettes    Last attempt to quit: 06/11/2018    Years since quitting: 4.7   Smokeless tobacco: Former    Types: Snuff    Quit date: 1996   Tobacco comments:    I vape weekly."  Vaping Use   Vaping Use: Some days   Devices: x3 per day to quit smoking nicotine   Substance and Sexual Activity   Alcohol use: Yes    Comment: cut back, 6 pack per month   Drug use: Not Currently    Types: Marijuana    Comment: a few times a year   Sexual activity: Yes    Partners: Female    Birth control/protection: None  Other Topics Concern   Not on  file  Social History Narrative   10/15/20   From: [place] originally, but in the area since 2000   Living: with wife, Verlon Au (2003) and granddaughter   Work: Scientist, 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[employer]      Family: 3 daughters - [place] Lack, Toni Amend (just finished law school), [place], and granddaughter McKenzie (2011)      Enjoys: travel when able, beach, cycling      Exercise: exercise bike at home   Diet: does not follow diabetic diet, lower appetite with ozempic      Safety   Seat belts: Yes    Guns: Yes  and secure   Safe in relationships: Yes    Social Determinants of Health   Financial Resource Strain: Low Risk  (10/07/2020)   Overall Financial Resource Strain (CARDIA)    Difficulty of Paying Living Expenses: Not hard at all  Food Insecurity: Not on file  Transportation Needs: Not on file  Physical Activity: Not on file  Stress: Not on file  Social Connections: Not on file  Intimate Partner Violence: Not on file    Past Surgical History:  Procedure Laterality Date   CERVICAL DISC SURGERY     fusion   KNEE SURGERY Right    LOWER EXTREMITY ANGIOGRAPHY Right 11/16/2022   Procedure: Lower Extremity Angiography;  Surgeon: Annice Needy, MD;  Location: [facility];  Service: Cardiovascular;  Laterality: Right;   OTHER SURGICAL HISTORY  11/13/2022   steroid injection to lumbar spine   ROTATOR CUFF REPAIR     WISDOM TOOTH EXTRACTION      Family History  Problem Relation Age of Onset   Heart attack Father 41   Alcohol abuse Father    Drug abuse Father    Heart disease Father    Lung cancer Father    Hypertension Mother    Heart attack Maternal Grandmother 92   Stroke Maternal Aunt 20    Allergies  Allergen Reactions   Aspirin        Latest Ref Rng & Units 10/27/2022    9:48 AM 02/26/2021   10:02 AM 09/03/2020    4:08 PM  CBC  WBC 4.0 - 10.5 K/uL 4.5  5.2  6.0   Hemoglobin 13.0 - 17.0 g/dL 62.1  30.8  65.7   Hematocrit 39.0 - 52.0 % 47.2  43.6  42.9   Platelets  150.0 - 400.0 K/uL 300.0  291.0  335.0       CMP     Component Value Date/Time   NA 140 10/27/2022 0948   K 4.2 10/27/2022 0948   CL 102 10/27/2022 0948   CO2 27 10/27/2022 0948   GLUCOSE 181 (H) 10/27/2022 0948   BUN 16 11/16/2022 0752   CREATININE 0.87 11/16/2022 0752   CALCIUM 10.4 10/27/2022 0948   PROT 7.7 10/27/2022 0948   ALBUMIN 5.1 10/27/2022 0948   AST 22 10/27/2022 0948   ALT 35 10/27/2022 0948   ALKPHOS 81 10/27/2022 0948   BILITOT 0.3 10/27/2022 0948   GFR 83.22 10/27/2022 0948   EGFR 7.9 02/17/2022 0000   GFRNONAA >60 11/16/2022 0752     No results found.     Assessment & Plan:   1. Atherosclerosis of native artery of both lower extremities with intermittent claudication (HCC) Recommend:  The patient has experienced increased claudication symptoms and is now describing lifestyle limiting claudication and appears to be having mild rest pain symptroms.  Given the severity of the patient's severe left lower extremity symptoms the patient should undergo angiography with the hope for intervention.  Risk and benefits were reviewed the patient.  Indications for the procedure were reviewed.  All questions were answered, the patient agrees to proceed with left lower extremity angiography and possible intervention.   The patient should continue walking and begin a more formal exercise program.  The patient should continue antiplatelet therapy and aggressive treatment of the lipid abnormalities  The patient will follow up with me after the angiogram.   2. Hyperlipidemia associated with type 2 diabetes mellitus (HCC) Continue statin as ordered and reviewed, no changes at this time  3. Type 2 diabetes mellitus without complication, without long-term current use of insulin (HCC) Continue hypoglycemic medications as already ordered, these medications have been reviewed and there are no changes at this time.  Hgb A1C to be monitored as already arranged by primary  service   Current Outpatient Medications on File Prior to Visit  Medication Sig Dispense Refill   albuterol (VENTOLIN HFA) 108 (90 Base) MCG/ACT inhaler USE 2 INHALATIONS EVERY 6 HOURS AS NEEDED FOR WHEEZING OR SHORTNESS OF BREATH 8.5 g 10   allopurinol (ZYLOPRIM) 100 MG tablet Take 200 mg by mouth daily.     ALPRAZolam (XANAX) 0.5 MG tablet Take 0.5 mg by mouth as needed.     atorvastatin (LIPITOR) 80 MG tablet Take 1 tablet (80 mg total) by mouth daily. 90 tablet 3   Blood Glucose Monitoring Suppl (ONETOUCH VERIO FLEX SYSTEM) w/Device KIT by Does not apply route.     cetirizine (ZYRTEC) 10 MG tablet Take 10 mg by mouth daily.     clopidogrel (PLAVIX) 75 MG tablet Take 1 tablet (75 mg total) by mouth daily. 30 tablet 11   clotrimazole-betamethasone (LOTRISONE) cream Apply 1 Application topically daily. 30 g 0   clotrimazole-betamethasone (LOTRISONE) cream Apply 1 Application topically daily. 30 g 0   colchicine 0.6 MG tablet Take 0.6 mg by mouth as needed.     Continuous Blood Gluc Sensor (FREESTYLE LIBRE 2 SENSOR) MISC USE TO CHECK GLUCOSE THREE TIMES A DAY 2 each 12   diazepam (VALIUM) 5 MG tablet 1-2 30 minutes before ESI     fluticasone (FLONASE) 50 MCG/ACT nasal spray Place 2 sprays into both nostrils daily. 16 g 6   Fluticasone Furoate (ARNUITY ELLIPTA) 100 MCG/ACT AEPB Inhale 1 puff into the lungs daily in  the afternoon. 180 each 2   gabapentin (NEURONTIN) 300 MG capsule Take 300 mg by mouth 2 (two) times daily.     glipiZIDE (GLUCOTROL XL) 10 MG 24 hr tablet Take 10 mg by mouth.     ipratropium (ATROVENT) 0.06 % nasal spray Place 2 sprays into both nostrils 4 (four) times daily. 15 mL 12   ketoconazole (NIZORAL) 2 % cream APPLY TOPICALLY TWICE A DAY AS NEEDED     Melatonin 10 MG CAPS Take 10 mg by mouth every evening.     Multiple Vitamin (MULTI-VITAMIN) tablet Take by mouth.     omeprazole (PRILOSEC) 40 MG capsule Take 1 capsule (40 mg total) by mouth in the morning and at bedtime.  180 capsule 1   OneTouch Delica Lancets 33G MISC by Does not apply route.     ONETOUCH VERIO test strip      pioglitazone (ACTOS) 15 MG tablet Take 15 mg by mouth daily.     Semaglutide, 2 MG/DOSE, (OZEMPIC, 2 MG/DOSE,) 8 MG/3ML SOPN Inject into the skin.     SYNJARDY XR 12.02-999 MG TB24 Take by mouth.     traZODone (DESYREL) 100 MG tablet Take 100 mg by mouth at bedtime.     valsartan (DIOVAN) 320 MG tablet TAKE 1 TABLET BY MOUTH EVERY DAY 90 tablet 3   No current facility-administered medications on file prior to visit.    There are no Patient Instructions on file for this visit. No follow-ups on file.   Georgiana Spinner, NP

## 2023-03-17 ENCOUNTER — Telehealth (INDEPENDENT_AMBULATORY_CARE_PROVIDER_SITE_OTHER): Payer: Self-pay

## 2023-03-17 NOTE — Telephone Encounter (Signed)
I attempted to contact the patient to schedule him for a LLE angio with Dr. Dew. A message was left for a return call. 

## 2023-03-17 NOTE — Telephone Encounter (Signed)
Patient called back and is now scheduled with Dr. Wyn Quaker for a LLE angio with a 8:45 am arrival time to the [facility]. Pre-procedure instructions were discussed and will be sent to Mychart and mailed.

## 2023-03-18 LAB — VAS US ABI WITH/WO TBI
Left ABI: 0.93
Right ABI: 1.02

## 2023-03-22 ENCOUNTER — Ambulatory Visit
Admission: RE | Admit: 2023-03-22 | Discharge: 2023-03-22 | Disposition: A | Discharge status: Home / Self Care | Payer: 59 | Attending: Vascular Surgery | Admitting: Vascular Surgery

## 2023-03-22 ENCOUNTER — Encounter
Admission: RE | Disposition: A | Discharge status: Home / Self Care | Payer: Self-pay | Source: Home / Self Care | Attending: Vascular Surgery

## 2023-03-22 ENCOUNTER — Encounter: Payer: Self-pay | Admitting: Vascular Surgery

## 2023-03-22 ENCOUNTER — Other Ambulatory Visit: Payer: Self-pay

## 2023-03-22 ENCOUNTER — Encounter: Payer: Self-pay | Admitting: Anesthesiology

## 2023-03-22 DIAGNOSIS — I70213 Atherosclerosis of native arteries of extremities with intermittent claudication, bilateral legs: Secondary | ICD-10-CM | POA: Diagnosis not present

## 2023-03-22 DIAGNOSIS — E785 Hyperlipidemia, unspecified: Secondary | ICD-10-CM | POA: Diagnosis not present

## 2023-03-22 DIAGNOSIS — E1151 Type 2 diabetes mellitus with diabetic peripheral angiopathy without gangrene: Secondary | ICD-10-CM | POA: Diagnosis present

## 2023-03-22 DIAGNOSIS — I70219 Atherosclerosis of native arteries of extremities with intermittent claudication, unspecified extremity: Secondary | ICD-10-CM

## 2023-03-22 HISTORY — PX: LOWER EXTREMITY ANGIOGRAPHY: CATH118251

## 2023-03-22 LAB — CREATININE, SERUM
Creatinine, Ser: 0.93 mg/dL (ref 0.61–1.24)
GFR, Estimated: >60 mL/min (ref >60)

## 2023-03-22 LAB — BUN: BUN: 19 mg/dL (ref 6–20)

## 2023-03-22 LAB — GLUCOSE, CAPILLARY: Glucose-Capillary: 151 mg/dL — ABNORMAL HIGH (ref 70–99)

## 2023-03-22 SURGERY — LOWER EXTREMITY ANGIOGRAPHY
Anesthesia: Moderate Sedation | Site: Leg Lower | Laterality: Left

## 2023-03-22 MED ORDER — HEPARIN SODIUM (PORCINE) 1000 UNIT/ML IJ SOLN
INTRAMUSCULAR | Status: AC
  Filled 2023-03-22: qty 10

## 2023-03-22 MED ORDER — MIDAZOLAM HCL 2 MG/2ML IJ SOLN
INTRAMUSCULAR | Status: DC | PRN
  Administered 2023-03-22: 2 mg via INTRAVENOUS

## 2023-03-22 MED ORDER — FAMOTIDINE 20 MG PO TABS: 40.0000 mg | ORAL_TABLET | Freq: Once | ORAL | Status: DC | PRN

## 2023-03-22 MED ORDER — CEFAZOLIN SODIUM-DEXTROSE 2-4 GM/100ML-% IV SOLN
2.0000 g | INTRAVENOUS | Status: AC
  Administered 2023-03-22: 2 g via INTRAVENOUS

## 2023-03-22 MED ORDER — IODIXANOL 320 MG/ML IV SOLN
INTRAVENOUS | Status: DC | PRN
  Administered 2023-03-22: 50 mL via INTRA_ARTERIAL

## 2023-03-22 MED ORDER — HEPARIN (PORCINE) IN NACL 1000-0.9 UT/500ML-% IV SOLN
INTRAVENOUS | Status: DC | PRN
  Administered 2023-03-22: 1000 mL

## 2023-03-22 MED ORDER — ONDANSETRON HCL 4 MG/2ML IJ SOLN: 4.0000 mg | Freq: Four times a day (QID) | INTRAMUSCULAR | Status: DC | PRN

## 2023-03-22 MED ORDER — HYDROMORPHONE HCL 1 MG/ML IJ SOLN: 1.0000 mg | Freq: Once | INTRAMUSCULAR | Status: DC | PRN

## 2023-03-22 MED ORDER — FENTANYL CITRATE (PF) 100 MCG/2ML IJ SOLN
INTRAMUSCULAR | Status: AC
  Filled 2023-03-22: qty 2

## 2023-03-22 MED ORDER — FENTANYL CITRATE (PF) 100 MCG/2ML IJ SOLN
INTRAMUSCULAR | Status: DC | PRN
  Administered 2023-03-22 (×2): 50 ug via INTRAVENOUS

## 2023-03-22 MED ORDER — DIPHENHYDRAMINE HCL 50 MG/ML IJ SOLN: 50.0000 mg | Freq: Once | INTRAMUSCULAR | Status: DC | PRN

## 2023-03-22 MED ORDER — MIDAZOLAM HCL 2 MG/ML PO SYRP: 8.0000 mg | ORAL_SOLUTION | Freq: Once | ORAL | Status: DC | PRN

## 2023-03-22 MED ORDER — CEFAZOLIN SODIUM-DEXTROSE 2-4 GM/100ML-% IV SOLN
INTRAVENOUS | Status: AC
  Filled 2023-03-22: qty 100

## 2023-03-22 MED ORDER — METHYLPREDNISOLONE SODIUM SUCC 125 MG IJ SOLR: 125.0000 mg | Freq: Once | INTRAMUSCULAR | Status: DC | PRN

## 2023-03-22 MED ORDER — MIDAZOLAM HCL 2 MG/2ML IJ SOLN
INTRAMUSCULAR | Status: AC
  Filled 2023-03-22: qty 2

## 2023-03-22 MED ORDER — HEPARIN SODIUM (PORCINE) 1000 UNIT/ML IJ SOLN
INTRAMUSCULAR | Status: DC | PRN
  Administered 2023-03-22: 5000 [IU] via INTRAVENOUS

## 2023-03-22 MED ORDER — SODIUM CHLORIDE 0.9 % IV SOLN: INTRAVENOUS | Status: DC

## 2023-03-22 SURGICAL SUPPLY — 14 items
CATH ANGIO 5F PIGTAIL 65CM (CATHETERS) IMPLANT
CATH BEACON 5 .035 65 KMP TIP (CATHETERS) IMPLANT
CATH BEACON 5 .038 100 VERT TP (CATHETERS) IMPLANT
CATH NAVICROSS ANGLED 90CM (MICROCATHETER) IMPLANT
COVER PROBE ULTRASOUND 5X96 (MISCELLANEOUS) IMPLANT
DEVICE STARCLOSE SE CLOSURE (Vascular Products) IMPLANT
GLIDEWIRE ADV .035X260CM (WIRE) IMPLANT
GUIDEWIRE PFTE-COATED .018X300 (WIRE) IMPLANT
PACK ANGIOGRAPHY (CUSTOM PROCEDURE TRAY) ×1 IMPLANT
SHEATH ANL2 6FRX45 HC (SHEATH) IMPLANT
SHEATH BRITE TIP 5FRX11 (SHEATH) IMPLANT
SYR MEDRAD MARK 7 150ML (SYRINGE) IMPLANT
TUBING CONTRAST HIGH PRESS 72 (TUBING) IMPLANT
WIRE GUIDERIGHT .035X150 (WIRE) IMPLANT

## 2023-03-22 NOTE — Interval H&P Note (Signed)
History and Physical Interval Note:  03/22/2023 8:57 AM  Kevin Huerta  has presented today for surgery, with the diagnosis of LLE Angio   ASO w claudication.  The various methods of treatment have been discussed with the patient and family. After consideration of risks, benefits and other options for treatment, the patient has consented to  Procedure(s): Lower Extremity Angiography (Left) as a surgical intervention.  The patient's history has been reviewed, patient examined, no change in status, stable for surgery.  I have reviewed the patient's chart and labs.  Questions were answered to the patient's satisfaction.     Festus Barren

## 2023-03-22 NOTE — Op Note (Signed)
[facility]  Percutaneous Study/Intervention Procedural Note   Date of Surgery: 03/22/2023  Surgeon(s):Vanda Waskey    Assistants:none  Pre-operative Diagnosis: PAD with claudication bilateral lower extremities  Post-operative diagnosis:  Same  Procedure(s) Performed:             1.  Ultrasound guidance for vascular access right femoral artery             2.  Catheter placement into left SFA from right femoral approach             3.  Aortogram and selective left lower extremity angiogram             4.  StarClose closure device right femoral artery  EBL: 5 cc  Contrast: 50 cc  Fluoro Time: 14.4 minutes  Moderate Conscious Sedation Time: approximately 54 minutes using 2 mg of Versed and 100 mcg of Fentanyl              Indications:  Patient is a 57 y.o.male with disabling claudication symptoms of the lower extremities particular on the left. The patient has noninvasive study showing reduced ABIs bilaterally. The patient is brought in for angiography for further evaluation and potential treatment.  Risks and benefits are discussed and informed consent is obtained.   Procedure:  The patient was identified and appropriate procedural time out was performed.  The patient was then placed supine on the table and prepped and draped in the usual sterile fashion. Moderate conscious sedation was administered during a face to face encounter with the patient throughout the procedure with my supervision of the RN administering medicines and monitoring the patient's vital signs, pulse oximetry, telemetry and mental status throughout from the start of the procedure until the patient was taken to the recovery room. Ultrasound was used to evaluate the right common femoral artery.  It was patent .  A digital ultrasound image was acquired.  A Seldinger needle was used to access the right common femoral artery under direct ultrasound guidance and a permanent image was performed.  A  0.035 J wire was advanced without resistance and a 5Fr sheath was placed.  Pigtail catheter was placed into the aorta and an AP aortogram was performed. This demonstrated normal renal arteries and normal aorta and iliac segments without significant stenosis. I then crossed the aortic bifurcation and advanced to the left femoral head. Selective left lower extremity angiogram was then performed. This demonstrated disease of the bifurcation of the common femoral artery into the origins of both the profunda femoris artery and superficial femoral artery creating at least a moderate stenosis in the 60% range.  The SFA was diseased but had flow for about the first 8 to 10 cm then it occluded.  This was a relatively long occlusion down to the Hunter's canal where there was reconstitution.  The popliteal normalized after some proximal disease and then there was three-vessel runoff distally without focal stenosis in the tibial vessels identified. The patient was systemically heparinized and a 6 [place] An[place] sheath was then placed over the [employer] wire. I then used a Kumpe catheter and the advantage wire to navigate down into the SFA.  Attempts to cross the long SFA occlusion today with a Kumpe catheter, Nava cross catheter and both a 0.018 and 0.035 advantage wire were unsuccessful.  However, with his common femoral disease he likely needs a hybrid procedure with a femoral endarterectomy as well as potential endoluminal intervention for the SFA occlusion.  We  will discuss that with him for another day. I elected to terminate the procedure. The sheath was removed and StarClose closure device was deployed in the right femoral artery with excellent hemostatic result.  Of note, the right femoral bifurcation also had disease more in the profunda femoris artery but also some in the proximal superficial femoral artery.  The patient was taken to the recovery room in stable condition having tolerated the procedure  well.  Findings:               Aortogram:  This demonstrated normal renal arteries and normal aorta and iliac segments without significant stenosis.             Left Lower Extremity:  This demonstrated disease of the bifurcation of the common femoral artery into the origins of both the profunda femoris artery and superficial femoral artery creating at least a moderate stenosis in the 60% range.  The SFA was diseased but had flow for about the first 8 to 10 cm then it occluded.  This was a relatively long occlusion down to the Hunter's canal where there was reconstitution.  The popliteal normalized after some proximal disease and then there was three-vessel runoff distally without focal stenosis in the tibial vessels identified   Disposition: Patient was taken to the recovery room in stable condition having tolerated the procedure well.  Complications: None  Festus Barren 03/22/2023 11:10 AM   This note was created with Dragon Medical transcription system. Any errors in dictation are purely unintentional.

## 2023-03-23 ENCOUNTER — Other Ambulatory Visit (INDEPENDENT_AMBULATORY_CARE_PROVIDER_SITE_OTHER): Payer: Self-pay | Admitting: Nurse Practitioner

## 2023-03-23 ENCOUNTER — Encounter: Payer: Self-pay | Admitting: Vascular Surgery

## 2023-03-23 ENCOUNTER — Telehealth (INDEPENDENT_AMBULATORY_CARE_PROVIDER_SITE_OTHER): Payer: Self-pay

## 2023-03-23 MED ORDER — HYDROCODONE-ACETAMINOPHEN 5-325 MG PO TABS: 1.0000 | ORAL_TABLET | ORAL | 0 refills | Status: DC | PRN

## 2023-03-23 NOTE — Telephone Encounter (Signed)
Spoke with leslie and she states understanding

## 2023-03-23 NOTE — Telephone Encounter (Signed)
I sent in some norco, he can take the dressing off today and place a bandaid over the area for the next few day s

## 2023-03-25 ENCOUNTER — Encounter (HOSPITAL_COMMUNITY): Payer: Self-pay

## 2023-03-25 ENCOUNTER — Other Ambulatory Visit (HOSPITAL_COMMUNITY): Payer: Self-pay | Admitting: *Deleted

## 2023-03-25 MED ORDER — METOPROLOL TARTRATE 100 MG PO TABS: ORAL_TABLET | ORAL | 0 refills | Status: DC

## 2023-03-26 ENCOUNTER — Telehealth (HOSPITAL_COMMUNITY): Payer: Self-pay | Admitting: *Deleted

## 2023-03-26 NOTE — Telephone Encounter (Signed)
Reaching out to patient to offer assistance regarding upcoming cardiac imaging study; pt verbalizes understanding of appt date/time, parking situation and where to check in, pre-test NPO status and medications ordered, and verified current allergies; name and call back number provided for further questions should they arise  Breanah Faddis RN Navigator Cardiac Imaging [facility] [phone] office [phone] cell  Patient to take 100mg metoprolol tartrate two hours prior to his cardiac CT scan. 

## 2023-03-29 ENCOUNTER — Ambulatory Visit
Admission: RE | Admit: 2023-03-29 | Discharge: 2023-03-29 | Disposition: A | Discharge status: Home / Self Care | Payer: 59 | Source: Ambulatory Visit | Attending: Internal Medicine | Admitting: Internal Medicine

## 2023-03-29 DIAGNOSIS — I25118 Atherosclerotic heart disease of native coronary artery with other forms of angina pectoris: Secondary | ICD-10-CM | POA: Insufficient documentation

## 2023-03-29 DIAGNOSIS — I2089 Other forms of angina pectoris: Secondary | ICD-10-CM | POA: Insufficient documentation

## 2023-03-29 HISTORY — DX: Atherosclerotic heart disease of native coronary artery with other forms of angina pectoris: I25.118

## 2023-03-29 MED ORDER — METOPROLOL TARTRATE 5 MG/5ML IV SOLN
INTRAVENOUS | Status: AC
  Filled 2023-03-29: qty 10

## 2023-03-29 MED ORDER — NITROGLYCERIN 0.4 MG SL SUBL
0.8000 mg | SUBLINGUAL_TABLET | Freq: Once | SUBLINGUAL | Status: AC
  Administered 2023-03-29: 0.8 mg via SUBLINGUAL
  Filled 2023-03-29: qty 25

## 2023-03-29 MED ORDER — NITROGLYCERIN 0.4 MG SL SUBL
SUBLINGUAL_TABLET | SUBLINGUAL | Status: AC
  Filled 2023-03-29: qty 1

## 2023-03-29 MED ORDER — METOPROLOL TARTRATE 5 MG/5ML IV SOLN
10.0000 mg | Freq: Once | INTRAVENOUS | Status: AC
  Administered 2023-03-29: 10 mg via INTRAVENOUS
  Filled 2023-03-29: qty 10

## 2023-03-29 MED ORDER — IOHEXOL 350 MG/ML SOLN
80.0000 mL | Freq: Once | INTRAVENOUS | Status: AC | PRN
  Administered 2023-03-29: 80 mL via INTRAVENOUS

## 2023-03-29 NOTE — Progress Notes (Signed)
Patient tolerated procedure well. Ambulate w/o difficulty. Denies any lightheadedness or being dizzy. Pt denies any pain at this time. Sitting in chair, pt is encouraged to drink additional water throughout the day and reason explained to patient. Patient verbalized understanding and all questions answered. ABC intact. No further needs at this time. Discharge from procedure area w/o issues.  

## 2023-03-30 ENCOUNTER — Encounter (INDEPENDENT_AMBULATORY_CARE_PROVIDER_SITE_OTHER): Payer: Self-pay

## 2023-03-30 ENCOUNTER — Telehealth (INDEPENDENT_AMBULATORY_CARE_PROVIDER_SITE_OTHER): Payer: Self-pay

## 2023-03-30 NOTE — Telephone Encounter (Signed)
Patient's wife called in stating the patient is having pain in his left leg and right groin pain as well. Patient had a LLE angio with Dr. Wyn Quaker on 03/22/23. Please advise.

## 2023-03-30 NOTE — Telephone Encounter (Signed)
Called and talked with patient, the groin pain is expected like with previous procedure.  I discussed that he's likely having some worsening claudication, early rest pain due to his needing an endarterectomy.  He had a recent Cardiac CT, we have sent a message to his cardiologist to see if that will suffice for work up or if he will need a further appointment before we can move forward

## 2023-03-31 NOTE — Telephone Encounter (Signed)
Patient's wife called back and we have decided on a date for the patient's femoral endarterectomy of 04/14/23 with Dr. Wyn Quaker. Both will be informed when the surgery has been scheduled.

## 2023-04-05 ENCOUNTER — Other Ambulatory Visit: Payer: 59

## 2023-04-06 ENCOUNTER — Encounter: Payer: Self-pay | Admitting: Urgent Care

## 2023-04-06 ENCOUNTER — Other Ambulatory Visit: Payer: Self-pay

## 2023-04-06 ENCOUNTER — Other Ambulatory Visit (INDEPENDENT_AMBULATORY_CARE_PROVIDER_SITE_OTHER): Payer: Self-pay | Admitting: Nurse Practitioner

## 2023-04-06 ENCOUNTER — Encounter: Payer: Self-pay | Admitting: Vascular Surgery

## 2023-04-06 ENCOUNTER — Encounter
Admission: RE | Admit: 2023-04-06 | Discharge: 2023-04-06 | Disposition: A | Discharge status: Home / Self Care | Payer: 59 | Source: Ambulatory Visit | Attending: Vascular Surgery | Admitting: Vascular Surgery

## 2023-04-06 VITALS — BP 138/71 | HR 86 | Temp 98.9°F | Resp 20 | Ht 69.5 in | Wt 237.2 lb

## 2023-04-06 DIAGNOSIS — Z01818 Encounter for other preprocedural examination: Secondary | ICD-10-CM | POA: Diagnosis present

## 2023-04-06 DIAGNOSIS — I70213 Atherosclerosis of native arteries of extremities with intermittent claudication, bilateral legs: Secondary | ICD-10-CM | POA: Diagnosis not present

## 2023-04-06 DIAGNOSIS — E119 Type 2 diabetes mellitus without complications: Secondary | ICD-10-CM | POA: Diagnosis not present

## 2023-04-06 HISTORY — DX: Atherosclerosis of native arteries of extremities with intermittent claudication, bilateral legs: I70.213

## 2023-04-06 HISTORY — DX: Tinnitus, unspecified ear: H93.19

## 2023-04-06 HISTORY — DX: Obstructive sleep apnea (adult) (pediatric): G47.33

## 2023-04-06 HISTORY — DX: Emphysema, unspecified: J43.9

## 2023-04-06 HISTORY — DX: Atherosclerotic heart disease of native coronary artery with other forms of angina pectoris: I25.118

## 2023-04-06 HISTORY — DX: Unspecified osteoarthritis, unspecified site: M19.90

## 2023-04-06 LAB — CBC WITH DIFFERENTIAL/PLATELET
Abs Immature Granulocytes: 0.02 10*3/uL (ref 0.00–0.07)
Basophils Absolute: 0 10*3/uL (ref 0.0–0.1)
Basophils Relative: 1 %
Eosinophils Absolute: 0.3 10*3/uL (ref 0.0–0.5)
Eosinophils Relative: 5 %
HCT: 44.3 % (ref 39.0–52.0)
Hemoglobin: 14.1 g/dL (ref 13.0–17.0)
Immature Granulocytes: 0 %
Lymphocytes Relative: 38 %
Lymphs Abs: 1.9 10*3/uL (ref 0.7–4.0)
MCH: 26.1 pg (ref 26.0–34.0)
MCHC: 31.8 g/dL (ref 30.0–36.0)
MCV: 82 fL (ref 80.0–100.0)
Monocytes Absolute: 0.4 10*3/uL (ref 0.1–1.0)
Monocytes Relative: 8 %
Neutro Abs: 2.4 10*3/uL (ref 1.7–7.7)
Neutrophils Relative %: 48 %
Platelets: 283 10*3/uL (ref 150–400)
RBC: 5.4 MIL/uL (ref 4.22–5.81)
RDW: 14.7 % (ref 11.5–15.5)
WBC: 5 10*3/uL (ref 4.0–10.5)
nRBC: 0 % (ref 0.0–0.2)

## 2023-04-06 LAB — BASIC METABOLIC PANEL
Anion gap: 9 (ref 5–15)
BUN: 15 mg/dL (ref 6–20)
CO2: 25 mmol/L (ref 22–32)
Calcium: 9.6 mg/dL (ref 8.9–10.3)
Chloride: 105 mmol/L (ref 98–111)
Creatinine, Ser: 1 mg/dL (ref 0.61–1.24)
GFR, Estimated: >60 mL/min (ref >60)
Glucose, Bld: 147 mg/dL — ABNORMAL HIGH (ref 70–99)
Potassium: 4.2 mmol/L (ref 3.5–5.1)
Sodium: 139 mmol/L (ref 135–145)

## 2023-04-06 LAB — TYPE AND SCREEN
ABO/RH(D): A POS
Antibody Screen: NEGATIVE

## 2023-04-06 NOTE — Patient Instructions (Addendum)
Your procedure is scheduled on: 04/14/2023 Wednesday Report to the Registration Desk on the 1st floor of the [facility]. To find out your arrival time, please call [phone] between 1PM - 3PM on: 04/13/2023 Tuesday  If your arrival time is 6:00 am, do not arrive before that time as the [facility] entrance doors do not open until 6:00 am.  REMEMBER: Instructions that are not followed completely may result in serious medical risk, up to and including death; or upon the discretion of your surgeon and anesthesiologist your surgery may need to be rescheduled.  Do not eat food after midnight the night before surgery.  No gum chewing or hard candies.   One week prior to surgery: Stop Anti-inflammatories (NSAIDS) such as Advil, Aleve, Ibuprofen, Motrin, Naproxen, Naprosyn and Aspirin based products such as Excedrin, Goody's Powder, BC Powder. Stop ANY OVER THE COUNTER supplements until after surgery. You may however, continue to take Tylenol if needed for pain up until the day of surgery.  Continue taking all prescribed medications up to the night before surgery with the exception of the following:     Plavix- stopped taking plavix 7 days prior to surgery.Follow recommendations from Cardiologist or PCP regarding stopping blood thinners.     Semaglutide, 2 MG/DOSE - Do not administer this medication 7 days prior to surgery      [place] - hold for three days.                 TAKE ONLY THESE MEDICATIONS THE MORNING OF SURGERY WITH A SIP OF WATER:   allopurinol (ZYLOPRIM   atorvastatin (LIPITOR)  Omeprazole  (take one the night before and one on the morning of surgery - helps to prevent nausea after surgery.)   Use inhalers as prescribed on the day of surgery and bring to the hospital.  No Alcohol for 24 hours before or after surgery.  No Smoking including e-cigarettes for 24 hours before surgery.  No chewable tobacco products for at least 6 hours before surgery.  No nicotine patches  on the day of surgery.  Do not use any "recreational" drugs for at least a week (preferably 2 weeks) before your surgery.  Please be advised that the combination of cocaine and anesthesia may have negative outcomes, up to and including death. If you test positive for cocaine, your surgery will be cancelled.  On the morning of surgery brush your teeth with toothpaste and water, you may rinse your mouth with mouthwash if you wish. Do not swallow any toothpaste or mouthwash.  Use CHG Soap or wipes as directed on instruction sheet.  Do not wear jewelry, make-up, hairpins, clips or nail polish.  Do not wear lotions, powders, or perfumes.   Do not shave body hair from the neck down 48 hours before surgery.  Contact lenses, hearing aids and dentures may not be worn into surgery.  Do not bring valuables to the hospital. [facility] is not responsible for any missing/lost belongings or valuables.   Notify your doctor if there is any change in your medical condition (cold, fever, infection).  Wear comfortable clothing (specific to your surgery type) to the hospital.  After surgery, you can help prevent lung complications by doing breathing exercises.  Take deep breaths and cough every 1-2 hours. Your doctor may order a device called an Incentive Spirometer to help you take deep breaths.  If you are being admitted to the hospital overnight, leave your suitcase in the car. After surgery it may  be brought to your room.  In case of increased patient census, it may be necessary for you, the patient, to continue your postoperative care in the Same Day Surgery department.  If you are being discharged the day of surgery, you will not be allowed to drive home. You will need a responsible individual to drive you home and stay with you for 24 hours after surgery.    Please call the Pre-admissions Testing Dept. at [phone] if you have any questions about these instructions.  Surgery Visitation  Policy:  Patients having surgery or a procedure may have two visitors.  Children under the age of 51 must have an adult with them who is not the patient.  Inpatient Visitation:    Visiting hours are 7 a.m. to 8 p.m. Up to four visitors are allowed at one time in a patient room. The visitors may rotate out with other people during the day.  One visitor age 58 or older may stay with the patient overnight and must be in the room by 8 p.m.        Preparing for Surgery with CHLORHEXIDINE GLUCONATE (CHG) Soap  Chlorhexidine Gluconate (CHG) Soap  o An antiseptic cleaner that kills germs and bonds with the skin to continue killing germs even after washing  o Used for showering the night before surgery and morning of surgery  Before surgery, you can play an important role by reducing the number of germs on your skin.  CHG (Chlorhexidine gluconate) soap is an antiseptic cleanser which kills germs and bonds with the skin to continue killing germs even after washing.  Please do not use if you have an allergy to CHG or antibacterial soaps. If your skin becomes reddened/irritated stop using the CHG.  1. Shower the NIGHT BEFORE SURGERY and the MORNING OF SURGERY with CHG soap.  2. If you choose to wash your hair, wash your hair first as usual with your normal shampoo.  3. After shampooing, rinse your hair and body thoroughly to remove the shampoo.  4. Use CHG as you would any other liquid soap. You can apply CHG directly to the skin and wash gently with a scrungie or a clean washcloth.  5. Apply the CHG soap to your body only from the neck down. Do not use on open wounds or open sores. Avoid contact with your eyes, ears, mouth, and genitals (private parts). Wash face and genitals (private parts) with your normal soap.  6. Wash thoroughly, paying special attention to the area where your surgery will be performed.  7. Thoroughly rinse your body with warm water.  8. Do not shower/wash with  your normal soap after using and rinsing off the CHG soap.  9. Pat yourself dry with a clean towel.  10. Wear clean pajamas to bed the night before surgery.  12. Place clean sheets on your bed the night of your first shower and do not sleep with pets.  13. Shower again with the CHG soap on the day of surgery prior to arriving at the hospital.  14. Do not apply any deodorants/lotions/powders.  15. Please wear clean clothes to the hospital.

## 2023-04-07 ENCOUNTER — Telehealth (INDEPENDENT_AMBULATORY_CARE_PROVIDER_SITE_OTHER): Payer: Self-pay

## 2023-04-07 NOTE — Progress Notes (Signed)
  Perioperative Services Pre-Admission/Anesthesia Testing    Date: 04/07/23  Name: Kevin Huerta MRN:   [id number]  Re: GLP-1 clearance and provider recommendations   Planned Surgical Procedure(s):    Case: [id number] Date/Time: 04/14/23 0715   Procedures:      ENDARTERECTOMY FEMORAL (SFA STENT) (Left)     APPLICATION OF CELL SAVER   Anesthesia type: General   Pre-op diagnosis: ASO WITH CLAUDICATION   Location: [facility] OR ROOM 08 / [facility]   Surgeons: Annice Needy, MD      Clinical Notes:  Patient is scheduled for the above procedure with the indicated provider/surgeon. In review of his medication reconciliation it was noted that patient is on a prescribed GLP-1 medication. Per guidelines issued by the American Society of Anesthesiologists (ASA), it is recommended that these medications be held for 7 days prior to the patient undergoing any type of elective surgical procedure. The patient is taking the following GLP-1 medication:  [x]  SEMAGLUTIDE   []  EXENATIDE  []  LIRAGLUTIDE   []  LIXISENATIDE  []  DULAGLUTIDE     []  TIRZEPATIDE (GLP-1/GIP)  Reached out to prescribing provider Gershon Crane, MD) to make them aware of the guidelines from anesthesia. Given that this patient takes the prescribed GLP-1 medication for his  diabetes diagnosis, rather than for weight loss, recommendations from the prescribing provider were solicited. Prescribing provider made aware of the following so that informed decision/POC can be developed for this patient that may be taking medications belonging to these drug classes:  Oral GLP-1 medications will be held 1 day prior to surgery.  Injectable GLP-1 medications will be held 7 days prior to surgery.  Metformin is routinely held 48 hours prior to surgery due to renal concerns, potential need for contrasted imaging perioperatively, and the potential for tissue hypoxia leading to drug induced lactic acidosis.  All SGLT2i medications are  held 72 hours prior to surgery as they can be associated with the increased potential for developing euglycemic diabetic ketoacidosis (EDKA).   Impression and Plan:  Kevin Huerta is on a prescribed GLP-1 medication, which induces the known side effect of decreased gastric emptying. Efforts are bring made to mitigate the risk of perioperative hyperglycemic events, as elevated blood glucose levels have been found to contribute to intra/postoperative complications. Additionally, hyperglycemic extremes can potentially necessitate the postponing of a patient's elective case in order to better optimize perioperative glycemic control, again with the aforementioned guidelines in place. With this in mind, recommendations have been sought from the prescribing provider, who has cleared patient to proceed with holding the prescribed GLP-1 as per the guidelines from the ASA.   Provider recommending: no further recommendations received from the prescribing provider.  Copy of signed clearance and recommendations placed on patient's chart for inclusion in their medical record and for review by the surgical/anesthetic team on the day of his procedure.   Quentin Mulling, MSN, APRN, FNP-C, CEN [facility]  Peri-operative Services Nurse Practitioner Phone: [phone] 04/07/23 2:22 PM  NOTE: This note has been prepared using Dragon dictation software. Despite my best ability to proofread, there is always the potential that unintentional transcriptional errors may still occur from this process.

## 2023-04-07 NOTE — Telephone Encounter (Signed)
Spoke with the patient's spouse and he is scheduled with Dr. Wyn Quaker on 04/14/23 for a left femoral enterectomy at [facility]. Pre-op was on 04/06/23 at the [facility]. Pre-surgical instructions will be sent to Mychart.

## 2023-04-11 ENCOUNTER — Encounter (INDEPENDENT_AMBULATORY_CARE_PROVIDER_SITE_OTHER): Payer: Self-pay

## 2023-04-11 ENCOUNTER — Ambulatory Visit
Admission: EM | Admit: 2023-04-11 | Discharge: 2023-04-11 | Disposition: A | Discharge status: Home / Self Care | Payer: 59 | Attending: Internal Medicine | Admitting: Internal Medicine

## 2023-04-11 DIAGNOSIS — J4521 Mild intermittent asthma with (acute) exacerbation: Secondary | ICD-10-CM

## 2023-04-11 DIAGNOSIS — J209 Acute bronchitis, unspecified: Secondary | ICD-10-CM | POA: Diagnosis not present

## 2023-04-11 MED ORDER — PREDNISONE 20 MG PO TABS
40.0000 mg | ORAL_TABLET | Freq: Every day | ORAL | 0 refills | Status: AC
Start: 2023-04-12 — End: 2023-04-17

## 2023-04-11 MED ORDER — PROMETHAZINE-DM 6.25-15 MG/5ML PO SYRP
5.0000 mL | ORAL_SOLUTION | Freq: Four times a day (QID) | ORAL | 0 refills | Status: DC | PRN
Start: 2023-04-11 — End: 2023-07-27

## 2023-04-11 MED ORDER — AMOXICILLIN-POT CLAVULANATE 875-125 MG PO TABS
1.0000 | ORAL_TABLET | Freq: Two times a day (BID) | ORAL | 0 refills | Status: DC
Start: 2023-04-11 — End: 2023-05-31

## 2023-04-11 MED ORDER — METHYLPREDNISOLONE ACETATE 80 MG/ML IJ SUSP
60.0000 mg | Freq: Once | INTRAMUSCULAR | Status: AC
  Administered 2023-04-11: 60 mg via INTRAMUSCULAR

## 2023-04-11 NOTE — ED Triage Notes (Signed)
Pt is with his wife.  Pt c/o cough, sore throat, nasal congestion, chest congestion and chest pain x7days  Pt has taken 2 covid test and they were both negative.  Pt took a breathing treatment this morning and O2 was at 97.   Pt states that he is allergic to asprin when taking 81mg  prescribed. Pt is ok taking baby asprin.

## 2023-04-11 NOTE — ED Provider Notes (Signed)
[facility]    CSN: [id number] Arrival date & time: 04/11/23  1304      History   Chief Complaint Chief Complaint  Patient presents with   Cough   Nasal Congestion         HPI Kevin Huerta is a 57 y.o. male  presents for evaluation of URI symptoms for 7 days. Patient reports associated symptoms of cough, congestion, wheezing/shortness of breath. Denies N/V/D, fevers, sore throat, ear pain, body ache. Patient does have a hx of asthma and occasionally vapes.  He has albuterol inhaler and home nebulizer which she used this morning.  No known sick contacts but he did recently go on a cruise.  Patient reports 2 negative home COVID test.  Pt has taken cough medicine OTC for symptoms. Pt has no other concerns at this time.    Cough Associated symptoms: shortness of breath and wheezing     Past Medical History:  Diagnosis Date   Anxiety    a.) on BZO (alprazolam) PRN   Arthritis    Asthma    Atherosclerosis of native artery of both lower extremities with intermittent claudication (HCC)    BRBPR (bright red blood per rectum) 05/24/2020   Chest heaviness 10/27/2022   Coronary artery disease of native artery of native heart with stable angina pectoris (HCC)    Dermatitis 06/02/2021   Diarrhea 02/26/2021   Emphysema lung (HCC)    Fatty liver 09/03/2020   Gout    Heart murmur    Hyperlipidemia    Hypersomnia 07/17/2014   Hypertension    Insomnia    a.) uses melatonin PRN   Leg cramps 02/26/2021   Long term current use of antithrombotics/antiplatelets    a.) clopidogrel   OSA (obstructive sleep apnea)    a.) not currently on nocturnal PAP therapy; needs repeat PSG   T2DM (type 2 diabetes mellitus) (HCC)    a.) monitors with Freestyle Libre CGM   Tinnitus     Patient Active Problem List   Diagnosis Date Noted   Tinnitus aurium, left 11/27/2022   DOE (dyspnea on exertion) 10/27/2022   Bronchitis 06/26/2022   Chronic bilateral low back pain with right-sided  sciatica 06/15/2022   Tinea 05/07/2022   Right wrist pain 02/26/2021   Pain due to onychomycosis of toenails of both feet 01/30/2021   Atherosclerosis of aorta (HCC) 11/07/2020   Coronary artery disease 11/07/2020   Emphysema lung (HCC) 11/07/2020   History of tobacco use 10/15/2020   Hyperlipidemia associated with type 2 diabetes mellitus (HCC) 10/15/2020   Insomnia 10/15/2020   Fatty liver 09/03/2020   Left wrist pain 01/29/2020   Chronic arthropathy 01/29/2020   Chronic gouty arthropathy 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 11/24/2017   OSA (obstructive sleep apnea) 07/17/2014   Snoring 07/17/2014   Cervical radiculopathy 01/22/2011   Hypertension associated with diabetes (HCC) 01/22/2011   Type 2 diabetes mellitus without complication, without long-term current use of insulin (HCC) 01/22/2011   Eczema 04/15/2010   Hyperlipidemia LDL goal <100 03/13/2010   Generalized anxiety disorder 12/17/2009   Allergic rhinitis 07/30/2009   Asthma 07/29/2009   Depression 03/31/2007   Gout 03/31/2007   Herpes simplex 03/31/2007    Past Surgical History:  Procedure Laterality Date   CERVICAL DISC SURGERY     fusion   KNEE SURGERY Right    LOWER EXTREMITY ANGIOGRAPHY Right 11/16/2022   Procedure: Lower Extremity Angiography;  Surgeon: Annice Needy, MD;  Location: [facility];  Service: Cardiovascular;  Laterality: Right;   LOWER EXTREMITY ANGIOGRAPHY Left 03/22/2023   Procedure: Lower Extremity Angiography;  Surgeon: Annice Needy, MD;  Location: [facility];  Service: Cardiovascular;  Laterality: Left;   OTHER SURGICAL HISTORY  11/13/2022   steroid injection to lumbar spine   ROTATOR CUFF REPAIR     WISDOM TOOTH EXTRACTION         Home Medications    Prior to Admission medications   Medication Sig Start Date End Date Taking? Authorizing Provider  albuterol (VENTOLIN HFA) 108 (90 Base) MCG/ACT inhaler USE 2 INHALATIONS EVERY 6 HOURS AS NEEDED FOR WHEEZING OR SHORTNESS OF BREATH 12/24/22   Yes Dana Allan, MD  allopurinol (ZYLOPRIM) 100 MG tablet Take 200 mg by mouth daily. 02/21/18  Yes [provider]  ALPRAZolam Prudy Feeler) 0.5 MG tablet Take 0.5 mg by mouth as needed. 10/17/18  Yes [provider]  amoxicillin-clavulanate (AUGMENTIN) 875-125 MG tablet Take 1 tablet by mouth every 12 (twelve) hours. 04/11/23  Yes Radford Pax, NP  atorvastatin (LIPITOR) 80 MG tablet Take 1 tablet (80 mg total) by mouth daily. 10/03/22  Yes Dana Allan, MD  Blood Glucose Monitoring Suppl (ONETOUCH VERIO FLEX SYSTEM) w/Device KIT by Does not apply route. 06/04/17  Yes [provider]  cetirizine (ZYRTEC) 10 MG tablet Take 10 mg by mouth daily.   Yes [provider]  clopidogrel (PLAVIX) 75 MG tablet Take 1 tablet (75 mg total) by mouth daily. 11/16/22  Yes Dew, Marlow Baars, MD  clotrimazole-betamethasone (LOTRISONE) cream Apply 1 Application topically daily. 03/02/23  Yes Candelaria Stagers, DPM  colchicine 0.6 MG tablet Take 0.6 mg by mouth as needed. 06/20/19  Yes [provider]  Continuous Blood Gluc Sensor (FREESTYLE LIBRE 2 SENSOR) MISC USE TO CHECK GLUCOSE THREE TIMES A DAY 12/02/22  Yes Dana Allan, MD  fluticasone [facility]) 50 MCG/ACT nasal spray Place 2 sprays into both nostrils daily. 11/23/22  Yes Dana Allan, MD  Fluticasone Furoate (ARNUITY ELLIPTA) 100 MCG/ACT AEPB Inhale 1 puff into the lungs daily in the afternoon. 03/02/23  Yes Coralyn Helling, MD  glipiZIDE (GLUCOTROL XL) 10 MG 24 hr tablet Take 10 mg by mouth. 03/17/18  Yes [provider]  HYDROcodone-acetaminophen (NORCO/VICODIN) 5-325 MG tablet Take 1 tablet by mouth every 4 (four) hours as needed for moderate pain. 03/23/23 03/22/24 Yes Georgiana Spinner, NP  ipratropium (ATROVENT) 0.06 % nasal spray Place 2 sprays into both nostrils 4 (four) times daily. 06/13/21  Yes Becky Augusta, NP  ketoconazole (NIZORAL) 2 % cream APPLY TOPICALLY TWICE A DAY AS NEEDED 04/26/19  Yes [provider]   Melatonin 10 MG CAPS Take 10 mg by mouth every evening.   Yes [provider]  Multiple Vitamin (MULTI-VITAMIN) tablet Take by mouth.   Yes [provider]  omeprazole (PRILOSEC) 40 MG capsule Take 1 capsule (40 mg total) by mouth in the morning and at bedtime. 10/12/22  Yes Dana Allan, MD  OneTouch Delica Lancets 33G MISC by Does not apply route. 06/04/17  Yes [provider]  ONETOUCH VERIO test strip  08/15/18  Yes [provider]  pioglitazone (ACTOS) 15 MG tablet Take 30 mg by mouth daily. 10/17/22  Yes [provider]  predniSONE (DELTASONE) 20 MG tablet Take 2 tablets (40 mg total) by mouth daily with breakfast for 5 days. 04/12/23 04/17/23 Yes Radford Pax, NP  promethazine-dextromethorphan (PROMETHAZINE-DM) 6.25-15 MG/5ML syrup Take 5 mLs by mouth 4 (four) times daily as needed  for cough. 04/11/23  Yes Radford Pax, NP  Semaglutide, 2 MG/DOSE, (OZEMPIC, 2 MG/DOSE,) 8 MG/3ML SOPN Inject into the skin. 03/04/21  Yes [provider]  SYNJARDY XR 12.02-999 MG TB24 Take by mouth. 03/05/21  Yes [provider]  traZODone (DESYREL) 100 MG tablet Take 50 mg by mouth at bedtime. 03/23/21  Yes [provider]  valsartan (DIOVAN) 320 MG tablet TAKE 1 TABLET BY MOUTH EVERY DAY 02/21/23  Yes Dana Allan, MD  gabapentin (NEURONTIN) 300 MG capsule Take 300 mg by mouth 2 (two) times daily. Patient not taking: Reported on 04/06/2023 05/01/22   [provider]    Family History Family History  Problem Relation Age of Onset   Heart attack Father 52   Alcohol abuse Father    Drug abuse Father    Heart disease Father    Lung cancer Father    Hypertension Mother    Heart attack Maternal Grandmother 107   Stroke Maternal Aunt 73    Social History Social History   Tobacco Use   Smoking status: Former    Packs/day: 1.50    Years: 32.00    Additional pack years: 0.00    Total pack years: 48.00    Types: Cigarettes,  E-cigarettes    Quit date: 06/11/2018    Years since quitting: 4.8   Smokeless tobacco: Former    Types: Snuff    Quit date: 1996   Tobacco comments:    I vape weekly."  Vaping Use   Vaping Use: Some days   Substances: Nicotine   Devices: x3 per day to quit smoking nicotine   Substance Use Topics   Alcohol use: Yes    Comment: cut back, 6 pack per month   Drug use: Not Currently    Types: Marijuana    Comment: a few times a year     Allergies   Aspirin   Review of Systems Review of Systems  HENT:  Positive for congestion.   Respiratory:  Positive for cough, shortness of breath and wheezing.      Physical Exam Triage Vital Signs ED Triage Vitals  Enc Vitals Group     BP 04/11/23 1319 136/84     Pulse Rate 04/11/23 1319 75     Resp --      Temp 04/11/23 1319 98.7 F (37.1 C)     Temp Source 04/11/23 1319 Oral     SpO2 04/11/23 1319 95 %     Weight 04/11/23 1316 238 lb (108 kg)     Height 04/11/23 1316 5\' 9"  (1.753 m)     Head Circumference --      Peak Flow --      Pain Score 04/11/23 1316 3     Pain Loc --      Pain Edu? --      Excl. in GC? --    No data found.  Updated Vital Signs BP 136/84 (BP Location: Left Arm)   Pulse 75   Temp 98.7 F (37.1 C) (Oral)   Ht 5\' 9"  (1.753 m)   Wt 238 lb (108 kg)   SpO2 95%   BMI 35.15 kg/m   Visual Acuity Right Eye Distance:   Left Eye Distance:   Bilateral Distance:    Right Eye Near:   Left Eye Near:    Bilateral Near:     Physical Exam Vitals and nursing note reviewed.  Constitutional:      General: He is not in  acute distress.    Appearance: Normal appearance. He is not ill-appearing or toxic-appearing.  HENT:     Head: Normocephalic and atraumatic.     Right Ear: Tympanic membrane and ear canal normal.     Left Ear: Tympanic membrane and ear canal normal.     Nose: Congestion present.     Mouth/Throat:     Mouth: Mucous membranes are moist.     Pharynx: No posterior oropharyngeal erythema.   Eyes:     Pupils: Pupils are equal, round, and reactive to light.  Cardiovascular:     Rate and Rhythm: Normal rate and regular rhythm.     Heart sounds: Normal heart sounds.  Pulmonary:     Effort: Pulmonary effort is normal.     Breath sounds: Normal breath sounds.  Musculoskeletal:     Cervical back: Normal range of motion and neck supple.  Lymphadenopathy:     Cervical: No cervical adenopathy.  Skin:    General: Skin is warm and dry.  Neurological:     General: No focal deficit present.     Mental Status: He is alert and oriented to person, place, and time.  Psychiatric:        Mood and Affect: Mood normal.        Behavior: Behavior normal.      UC Treatments / Results  Labs (all labs ordered are listed, but only abnormal results are displayed) Labs Reviewed - No data to display  EKG   Radiology No results found.  Procedures Procedures (including critical care time)  Medications Ordered in UC Medications  methylPREDNISolone acetate (DEPO-MEDROL) injection 60 mg (has no administration in time range)    Initial Impression / Assessment and Plan / UC Course  I have reviewed the triage vital signs and the nursing notes.  Pertinent labs & imaging results that were available during my care of the patient were reviewed by me and considered in my medical decision making (see chart for details).     Reviewed exam and symptoms with patient.  He is well-appearing and in no acute distress.  No red flags.  Patient requested steroid shot for his asthma while in clinic.  Was given Depo-Medrol IM.  Will start Augmentin and Promethazine DM.  Start oral prednisone tomorrow, 7/8.  Continue albuterol inhaler as needed.  PCP follow-up if symptoms do not improve.  ER precautions reviewed and patient verbalized understanding. Final Clinical Impressions(s) / UC Diagnoses   Final diagnoses:  Mild intermittent asthma with acute exacerbation  Acute bronchitis, unspecified organism      Discharge Instructions      Start Augmentin twice daily for 7 days.  May take promethazine DM as needed for cough.  Please note this medication make you drowsy.  Do not drink alcohol or drive on this medication.  Start prednisone daily tomorrow, 7/8.  Lots of rest and fluids.  Continue albuterol inhaler/home nebulizer as needed.  Please follow-up with your PCP if your symptoms do not improve.  Please go to the emergency room for any worsening symptoms.  Hope you feel better soon!     ED Prescriptions     Medication Sig Dispense Auth. Provider   amoxicillin-clavulanate (AUGMENTIN) 875-125 MG tablet Take 1 tablet by mouth every 12 (twelve) hours. 14 tablet Radford Pax, NP   predniSONE (DELTASONE) 20 MG tablet Take 2 tablets (40 mg total) by mouth daily with breakfast for 5 days. 10 tablet Radford Pax, NP   promethazine-dextromethorphan (PROMETHAZINE-DM) 6.25-15  MG/5ML syrup Take 5 mLs by mouth 4 (four) times daily as needed for cough. 118 mL Radford Pax, NP      PDMP not reviewed this encounter.   Radford Pax, NP 04/11/23 [phone]

## 2023-04-11 NOTE — Discharge Instructions (Addendum)
Start Augmentin twice daily for 7 days.  May take promethazine DM as needed for cough.  Please note this medication make you drowsy.  Do not drink alcohol or drive on this medication.  Start prednisone daily tomorrow, 7/8.  Lots of rest and fluids.  Continue albuterol inhaler/home nebulizer as needed.  Please follow-up with your PCP if your symptoms do not improve.  Please go to the emergency room for any worsening symptoms.  Hope you feel better soon!

## 2023-04-12 ENCOUNTER — Ambulatory Visit: Payer: Self-pay

## 2023-04-12 ENCOUNTER — Encounter: Payer: Self-pay | Admitting: Vascular Surgery

## 2023-04-12 ENCOUNTER — Telehealth (INDEPENDENT_AMBULATORY_CARE_PROVIDER_SITE_OTHER): Payer: Self-pay

## 2023-04-12 DIAGNOSIS — F1290 Cannabis use, unspecified, uncomplicated: Secondary | ICD-10-CM

## 2023-04-12 NOTE — Progress Notes (Signed)
Perioperative / Anesthesia Services  Pre-Admission Testing Clinical Review / Preoperative Anesthesia Consult  Date: 04/20/23  Patient Demographics:  Name: Kevin Huerta DOB:   June 04, 1966 MRN:   [id number]  Planned Surgical Procedure(s):    Case: [id number] Date/Time: 04/28/23 1159   Procedures:      ENDARTERECTOMY FEMORAL (SFA STENT) (Left)     APPLICATION OF CELL SAVER   Anesthesia type: General   Pre-op diagnosis: ASO WITH CLAUDICATION   Location: [facility] OR ROOM 08 / [facility]S FOR ANESTHESIA GROUP   Surgeons: Annice Needy, MD     NOTE: Available PAT nursing documentation and vital signs have been reviewed. Clinical nursing staff has updated patient's PMH/PSHx, current medication list, and drug allergies/intolerances to ensure comprehensive history available to assist in medical decision making as it pertains to the aforementioned surgical procedure and anticipated anesthetic course. Extensive review of available clinical information personally performed. [place] PMH and PSHx updated with any diagnoses/procedures that  may have been inadvertently omitted during his intake with the pre-admission testing department's nursing staff.  Clinical Discussion:  Kevin Huerta is a 57 y.o. male who is submitted for pre-surgical anesthesia review and clearance prior to him undergoing the above procedure. Patient is a Former Smoker (48 pack years; quit 06/2018). Pertinent PMH includes: CAD, PAD, aortic atherosclerosis, cardiac murmur, angina, HTN, HLD, T2DM, OSAH (not currently using nocturnal PAP therapy), GERD (on daily PPI), emphysema, asthma, OA, insomnia, anxiety (on BZO), episodic marijuana use, occasional ETOH consumption, vapes daily.   Patient is followed by cardiology [name], MD). He was last seen in the cardiology clinic on 02/22/2023; notes reviewed.  At the time of his clinic visit, patient reported that he was doing "reasonably well" from a cardiovascular perspective.  Patient with  episodes of dyspnea.  He described it as "frequently having to catch his breath".  Patient denied any episodes of chest pain, PND, orthopnea, palpitations, significant peripheral edema, vertiginous symptoms, or presyncope/syncope. Patient with a past medical history significant for cardiovascular diagnoses. Documented physical exam was grossly benign, providing no evidence of acute exacerbation and/or decompensation of the patient's known cardiovascular conditions.  Patient underwent diagnostic LEFT heart catheterization on 11/27/2008 revealing normal coronary anatomy with no evidence of obstructive CAD.  Diet and lifestyle modification in addition to medical therapy was recommended.  Patient underwent an exercise stress test on 07/16/2014 where he exercised for 3 minutes to achieve a maximum heart rate of 173 bpm, which was 100% of the MPHR.  Patient was able to achieve a workload of 7.1 METS.  Exercise did not induce chest pain.  Resting ECG had some nonspecific mild T wave inversions in the inferior leads that did not significantly change with exercise.  Patient underwent a stress echocardiogram on 04/13/2019 revealing a normal left ventricular systolic function with an EF of >55%.  Right ventricular size and function normal.  There was trivial mitral, tricuspid, and pulmonary valve regurgitation.  All transvalvular gradients remain normal with no evidence suggestive of valvular stenosis.  Study determined to be normal and low risk.  Patient underwent PTA of the RIGHT proximal peroneal, proximal SFA, RIGHT popliteal, and distal SFA on 11/16/2022.  6 mm x 15 cm Viabahn stent was placed to the RIGHT distal SFA and above-the-knee popliteal arteries.  Patient underwent a second PTA procedure on 03/22/2023.  Surgeon noted that the SFA was disease, however had flow for about the first 8-10 cm prior to total occlusion.  Occlusion reported to be long down to Hunter's canal  where there was reconstitution.  MD  attempted to navigate down into the SFA, however attempts to cross the lesion were unsuccessful.  Hyper procedure with a femoral endarterectomy and potential endoluminal intervention for the SFA occlusion recommended.  Coronary CTA performed on 03/29/2023 revealed an elevated coronary calcium score of 476.  This placed patient in the 95th percentile for age, sex, and race matched control.  Patient with normal coronary origin and LEFT-sided dominance.  Study suggestive of a mild (25-49%) proximal LAD stenosis and a minimal (<25%) proximal RCA stenosis.  Due to his PAD, patient is on daily standard dose clopidogrel.  He is reportedly compliant with therapy with no evidence or reports of GI bleeding.  Previously patient was on DAPT therapy, however he had to stop his daily low-dose ASA due to associated tinnitus. Blood pressure well controlled at 126/82 mmHg on currently prescribed ARB (valsartan) monotherapy. Patient is on atorvastatin for his HLD diagnosis and ASCVD prevention. T2DM loosely controlled on currently prescribed regimen; last HgbA1c was 7.8% when checked on 10/22/2022. Of note, since patient was last seen by cardiology, his A1c has been rechecked with further worsening to 8.4% when checked on 03/23/2023.  In the setting of known cardiovascular diagnoses and concurrent T2DM, patient is on an SGLT2i (empagliflozin) for added cardiovascular and renovascular protection. Patient does have an OSAH diagnosis, however he is not currently utilizing nocturnal PAP therapy.  Notes indicate that patient needs a repeat PSG to requalify for DME.  Functional capacity somewhat limited by patient's overall respiratory status and presumed deconditioning.  With that said, patient able to complete all of his ADL/IADLs independently without cardiovascular limitation.  Per the DASI, patient is able to complete >4 METS of physical activity without experiencing any significant degree of angina/anginal equivalent symptoms.   No changes were made to his medication regimen.  Patient to follow-up with outpatient cardiology in 2 months or sooner if needed.  Kevin Huerta is scheduled for a LEFT FEMORAL ENDARTERECTOMY AND POSSIBLE SFA STENTING on 04/28/2023 with Dr. Festus Barren, MD.  Given patient's past medical history significant for cardiovascular diagnoses, presurgical cardiac clearance was sought by the PAT team. Per cardiology, "this patient is optimized for surgery and may proceed with the planned procedural course with a LOW risk of significant perioperative cardiovascular complications".  In review of his medication reconciliation, it is noted that patient is currently on prescribed daily antithrombotic therapy. He has been instructed on recommendations for holding his clopidogrel for 7 days prior to his procedure with plans to restart as soon as postoperative bleeding risk felt to be minimized by his attending surgeon. The patient has been instructed that his last dose of his clopidogrel should be on 04/20/2023.  Patient denies previous perioperative complications with anesthesia in the past.  In review his EMR, there are no records available for review pertaining to past procedural/anesthetic courses within the [facility] system.     04/11/2023    1:19 PM 04/11/2023    1:16 PM 04/06/2023    2:45 PM  Vitals with BMI  Height  5\' 9"  5' 9.5"  Weight  238 lbs 237 lbs 3 oz  BMI  35.13 34.54  Systolic 136  138  Diastolic 84  71  Pulse 75      Providers/Specialists:   NOTE: Primary physician provider listed below. Patient may have been seen by APP or partner within same practice.   PROVIDER ROLE / SPECIALTY LAST Shanna Cisco, MD Vascular Surgery (Surgeon) 03/15/2023  Dana Allan, MD Primary Care Provider 11/23/2022  Rudean Hitt, MD Cardiology 02/22/2023  Coralyn Helling, MD Pulmonary Medicine 02/04/2023  Gerrie Nordmann, MD Rheumatology 01/22/2023  Verdis Frederickson, MD Endocrinology 03/23/2023  Merri Ray, MD Physiatry 11/13/2022   Allergies:  Aspirin  Current Home Medications:   No current facility-administered medications for this encounter.    albuterol (VENTOLIN HFA) 108 (90 Base) MCG/ACT inhaler   allopurinol (ZYLOPRIM) 100 MG tablet   ALPRAZolam (XANAX) 0.5 MG tablet   amoxicillin-clavulanate (AUGMENTIN) 875-125 MG tablet   atorvastatin (LIPITOR) 80 MG tablet   Blood Glucose Monitoring Suppl (ONETOUCH VERIO FLEX SYSTEM) w/Device KIT   cetirizine (ZYRTEC) 10 MG tablet   clopidogrel (PLAVIX) 75 MG tablet   clotrimazole-betamethasone (LOTRISONE) cream   colchicine 0.6 MG tablet   Continuous Blood Gluc Sensor (FREESTYLE LIBRE 2 SENSOR) MISC   fluticasone (FLONASE) 50 MCG/ACT nasal spray   Fluticasone Furoate (ARNUITY ELLIPTA) 100 MCG/ACT AEPB   gabapentin (NEURONTIN) 300 MG capsule   glipiZIDE (GLUCOTROL XL) 10 MG 24 hr tablet   HYDROcodone-acetaminophen (NORCO/VICODIN) 5-325 MG tablet   ipratropium (ATROVENT) 0.06 % nasal spray   ketoconazole (NIZORAL) 2 % cream   Melatonin 10 MG CAPS   Multiple Vitamin (MULTI-VITAMIN) tablet   omeprazole (PRILOSEC) 40 MG capsule   OneTouch Delica Lancets 33G MISC   ONETOUCH VERIO test strip   pioglitazone (ACTOS) 15 MG tablet   promethazine-dextromethorphan (PROMETHAZINE-DM) 6.25-15 MG/5ML syrup   Semaglutide, 2 MG/DOSE, (OZEMPIC, 2 MG/DOSE,) 8 MG/3ML SOPN   SYNJARDY XR 12.02-999 MG TB24   traZODone (DESYREL) 100 MG tablet   valsartan (DIOVAN) 320 MG tablet   History:   Past Medical History:  Diagnosis Date   Anginal equivalent    Anxiety    a.) on BZO (alprazolam) PRN   Aortic atherosclerosis (HCC)    Arthritis    Asthma    Atherosclerosis of native artery of both lower extremities with intermittent claudication (HCC)    a.) s/p PTA of RIGHT prox peroneal, proxSFA, popliteal, and distal SFA; 6 mm x 15 cm Viabahn stent to RIGHT distal SFA and above knee popliteal artery; b.) s/p unsuccessful PTA of occluded SFA 03/22/2023  --> plans for endarterectomy +/-  endoluminal intervention   BRBPR (bright red blood per rectum) 05/24/2020   Chest heaviness 10/27/2022   Coronary artery disease of native artery of native heart with stable angina pectoris (HCC) 03/29/2023   a.) cCTA 03/29/2023: Ca2+ 476 (95th percentile for age/sex/race matched control)   Current vaping on some days    DDD (degenerative disc disease), cervical    a.) s/p ACDF C3-C5 2014   Dermatitis 06/02/2021   Diarrhea 02/26/2021   Emphysema lung (HCC)    Fatty liver 09/03/2020   GERD (gastroesophageal reflux disease)    Gout    Heart murmur    History of cardiac catheterization 11/27/2008   a.) LHC 11/27/2008 at [facility] --> normal coronary anatomy with no obstructive CAD   Hyperlipidemia    Hypersomnia 07/17/2014   Hypertension    Insomnia    a.) uses melatonin +/- trazodone PRN   Leg cramps 02/26/2021   Long term current use of antithrombotics/antiplatelets    a.) clopidogrel   Marijuana use, episodic    Occasional alcohol consumption    OSA (obstructive sleep apnea)    a.) not currently on nocturnal PAP therapy; needs repeat PSG   Seasonal allergies    T2DM (type 2 diabetes mellitus) (HCC)    a.) monitors with Freestyle  Libre CGM   Tinnitus    Past Surgical History:  Procedure Laterality Date   ANTERIOR CERVICAL DECOMP/DISCECTOMY FUSION N/A 2014   KNEE SURGERY Right    LEFT HEART CATH AND CORONARY ANGIOGRAPHY Left 11/27/2008   Procedure: LEFT HEART CATH AND CORONARY ANGIOGRAPHY; Location: [facility]; Surgeon: Shari Prows, MD   LOWER EXTREMITY ANGIOGRAPHY Right 11/16/2022   Procedure: Lower Extremity Angiography;  Surgeon: Annice Needy, MD;  Location: [facility] INVASIVE CV LAB;  Service: Cardiovascular;  Laterality: Right;   LOWER EXTREMITY ANGIOGRAPHY Left 03/22/2023   Procedure: Lower Extremity Angiography;  Surgeon: Annice Needy, MD;  Location: [facility] INVASIVE CV LAB;  Service: Cardiovascular;   Laterality: Left;   ROTATOR CUFF REPAIR     WISDOM TOOTH EXTRACTION     Family History  Problem Relation Age of Onset   Heart attack Father 60   Alcohol abuse Father    Drug abuse Father    Heart disease Father    Lung cancer Father    Hypertension Mother    Heart attack Maternal Grandmother 28   Stroke Maternal Aunt 3   Social History   Tobacco Use   Smoking status: Former    Current packs/day: 0.00    Average packs/day: 1.5 packs/day for 32.0 years (48.0 ttl pk-yrs)    Types: Cigarettes, E-cigarettes    Start date: 06/11/1986    Quit date: 06/11/2018    Years since quitting: 4.8   Smokeless tobacco: Former    Types: Snuff    Quit date: 1996   Tobacco comments:    I vape weekly."  Vaping Use   Vaping status: Some Days   Substances: Nicotine   Devices: x3 per day to quit smoking nicotine   Substance Use Topics   Alcohol use: Yes    Comment: cut back, 6 pack per month   Drug use: Not Currently    Types: Marijuana    Comment: a few times a year    Pertinent Clinical Results:  LABS:   No visits with results within 3 Day(s) from this visit.  Latest known visit with results is:  Hospital Outpatient Visit on 04/06/2023  Component Date Value Ref Range Status   ABO/RH(D) 04/06/2023 A POS   Final   Antibody Screen 04/06/2023 NEG   Final   Sample Expiration 04/06/2023 04/20/2023,2359   Final   Extend sample reason 04/06/2023    Final                   Value:NO TRANSFUSIONS OR PREGNANCY IN THE PAST 3 MONTHS Performed at [facility], [address] [place], [place] [address]    WBC 04/06/2023 5.0  4.0 - 10.5 K/uL Final   RBC 04/06/2023 5.40  4.22 - 5.81 MIL/uL Final   Hemoglobin 04/06/2023 14.1  13.0 - 17.0 g/dL Final   HCT 02/72/5366 44.3  39.0 - 52.0 % Final   MCV 04/06/2023 82.0  80.0 - 100.0 fL Final   MCH 04/06/2023 26.1  26.0 - 34.0 pg Final   MCHC 04/06/2023 31.8  30.0 - 36.0 g/dL Final   RDW 44/12/4740 14.7  11.5 - 15.5 % Final   Platelets  04/06/2023 283  150 - 400 K/uL Final   nRBC 04/06/2023 0.0  0.0 - 0.2 % Final   Neutrophils Relative % 04/06/2023 48  % Final   Neutro Abs 04/06/2023 2.4  1.7 - 7.7 K/uL Final   Lymphocytes Relative 04/06/2023 38  % Final  Lymphs Abs 04/06/2023 1.9  0.7 - 4.0 K/uL Final   Monocytes Relative 04/06/2023 8  % Final   Monocytes Absolute 04/06/2023 0.4  0.1 - 1.0 K/uL Final   Eosinophils Relative 04/06/2023 5  % Final   Eosinophils Absolute 04/06/2023 0.3  0.0 - 0.5 K/uL Final   Basophils Relative 04/06/2023 1  % Final   Basophils Absolute 04/06/2023 0.0  0.0 - 0.1 K/uL Final   Immature Granulocytes 04/06/2023 0  % Final   Abs Immature Granulocytes 04/06/2023 0.02  0.00 - 0.07 K/uL Final   Performed at [facility], [address] [place], [place] [address]   Sodium 04/06/2023 139  135 - 145 mmol/L Final   Potassium 04/06/2023 4.2  3.5 - 5.1 mmol/L Final   Chloride 04/06/2023 105  98 - 111 mmol/L Final   CO2 04/06/2023 25  22 - 32 mmol/L Final   Glucose, Bld 04/06/2023 147 (H)  70 - 99 mg/dL Final   Glucose reference range applies only to samples taken after fasting for at least 8 hours.   BUN 04/06/2023 15  6 - 20 mg/dL Final   Creatinine, Ser 04/06/2023 1.00  0.61 - 1.24 mg/dL Final   Calcium 65/78/4696 9.6  8.9 - 10.3 mg/dL Final   GFR, Estimated 04/06/2023 >60  >60 mL/min Final   Comment: (NOTE) Calculated using the CKD-EPI Creatinine Equation (2021)    Anion gap 04/06/2023 9  5 - 15 Final   Performed at [facility], [address] [place], [place] [address]    ECG: Date: 04/06/2023 Time ECG obtained: 1454 PM Rate: 78 bpm Rhythm: normal sinus Axis (leads I and aVF): Normal Intervals: PR 156 ms. QRS 86 ms. QTc 419 ms. ST segment and T wave changes: Nonspecific T wave abnormalities in the lateral leads  Comparison: Similar to previous tracing obtained on 10/27/2022    IMAGING / PROCEDURES: CT CORONARY MORPH W/CTA COR W/SCORE W/CA W/CM &/OR WO/CM  performed on 03/29/2023 Scattered aortic atherosclerosis Coronary calcium score of 476. This was 95th percentile for age and sex matched control. Normal coronary origin with left dominance. Mild proximal LAD stenosis (25-49%). Minimal proximal RCA stenosis (<25%). CAD-RADS 2. Mild non-obstructive CAD (25-49%). Consider preventive therapy and risk factor modification.  VAS [place] LOWER EXTREMITY ARTERIAL DUPLEX performed on 03/15/2023 Imaging and Waveforms obtained throughout in the Right Lower Extremity. Biphasic Waveforms seen in the Tibial Arteries and Peroneal Artery.  Imaging and Waveforms obtained throughout in the Left Lower Extremity. The Left SFA appears to be occluded in the Proximal/Mid segment; collateral flow restored in the Distal SFA. Monophasic Waveforms seen in the Tibial Arteries and Peroneal Artery.   VAS [place] ABI WITH/WO TBI performed on 03/15/2023 Resting right ankle-brachial index is within normal range. The right toe-brachial index is normal.  Resting left ankle-brachial index indicates mild left lower extremity arterial disease. The left toe-brachial index is normal.   PULMONARY FUNCTION TESTING performed on 02/18/2023    Latest Ref Rng & Units 02/18/2023    4:46 PM  PFT Results  FVC-Pre L 3.95   FVC-Predicted Pre % 83   FVC-Post L 4.12   FVC-Predicted Post % 87   Pre FEV1/FVC % % 84   Post FEV1/FCV % % 82   FEV1-Pre L 3.31   FEV1-Predicted Pre % 91   FEV1-Post L 3.38   DLCO uncorrected ml/min/mmHg 34.75   DLCO UNC% % 126   DLVA Predicted % 123   TLC L 6.97   TLC %  Predicted % 102   RV % Predicted % 129    MR LUMBAR SPINE WO CONTRAST performed on 03/13/2022 At L5-S1 there is a broad-based disc osteophyte complex. Moderate bilateral facet arthropathy. Prominence of the epidural fat deforming the thecal sac as can be seen with epidural lipomatosis. No spinal stenosis. Severe bilateral foraminal stenosis. At L4-5 there is a mild broad-based disc bulge flattening  the ventral thecal sac. Moderate bilateral facet arthropathy. Mild spinal stenosis. Mild right foraminal stenosis. Mild spondylosis throughout the remainder of the lumbar spine as described above.  CT CHEST LUNG CA SCREEN LOW DOSE W/O CM performed on 02/02/2022 Lung-RADS 1, negative. Continue annual screening with low-dose chest CT without contrast in 12 months. Hepatic steatosis. Aortic atherosclerosis  Emphysema  Coronary artery atherosclerosis.   Impression and Plan:  Kevin Huerta has been referred for pre-anesthesia review and clearance prior to him undergoing the planned anesthetic and procedural courses. Available labs, pertinent testing, and imaging results were personally reviewed by me in preparation for upcoming operative/procedural course. [facility] medical record has been updated following extensive record review and patient interview with PAT staff.   This patient has been appropriately cleared by cardiology with an overall LOW risk of experiencing significant perioperative cardiovascular complications. Based on clinical review performed today (04/20/23), barring any significant acute changes in the patient's overall condition, it is anticipated that he will be able to proceed with the planned surgical intervention. Any acute changes in clinical condition may necessitate his procedure being postponed and/or cancelled. Patient will meet with anesthesia team (MD and/or CRNA) on the day of his procedure for preoperative evaluation/assessment. Questions regarding anesthetic course will be fielded at that time.   Pre-surgical instructions were reviewed with the patient during his PAT appointment, and questions were fielded to satisfaction by PAT clinical staff. He has been instructed on which medications that he will need to hold prior to surgery, as well as the ones that have been deemed safe/appropriate to take on the day of his procedure. As part of the general education provided by  PAT, patient made aware both verbally and in writing, that he would need to abstain from the use of any illegal substances during his perioperative course.  He was advised that failure to follow the provided instructions could necessitate case cancellation or result in serious perioperative complications up to and including death. Patient encouraged to contact PAT and/or his surgeon's office to discuss any questions or concerns that may arise prior to surgery; verbalized understanding.   Quentin Mulling, MSN, APRN, FNP-C, CEN [facility]  Peri-operative Services Nurse Practitioner Phone: [phone] Fax: [phone] 04/20/23 5:27 PM  NOTE: This note has been prepared using Dragon dictation software. Despite my best ability to proofread, there is always the potential that unintentional transcriptional errors may still occur from this process.

## 2023-04-12 NOTE — Telephone Encounter (Signed)
Patient's wife called and left a message stating the patient was seen at Urgent care and diagnosed with Bronchitis and he has Asthma and that has flared up as well. Patient was scheduled for a left femoral endarterectomy and left SFA stent placement on 04/14/23. Patient has been rescheduled to 04/28/23 at the [facility]. Patient's wife has been informed of the change as well.

## 2023-04-14 HISTORY — DX: Long term (current) use of antithrombotics/antiplatelets: Z79.02

## 2023-04-16 ENCOUNTER — Encounter: Payer: Self-pay | Admitting: Pulmonary Disease

## 2023-04-16 ENCOUNTER — Telehealth (INDEPENDENT_AMBULATORY_CARE_PROVIDER_SITE_OTHER): Payer: Self-pay

## 2023-04-16 NOTE — Telephone Encounter (Signed)
A call was received from the patient to reschedule his surgery from 04/28/23 to 07/07/23 with Dr. Wyn Quaker. Patient was made aware that he would need to come in to see the provider before his surgery and he agreed. Patient will be called to schedule and appt in September.

## 2023-04-17 ENCOUNTER — Encounter: Payer: Self-pay | Admitting: Urgent Care

## 2023-04-19 NOTE — Telephone Encounter (Signed)
 Dr. Halford Chessman, please advise. Thanks

## 2023-04-19 NOTE — Telephone Encounter (Signed)
No additional treatment needed at this time since he is improving.

## 2023-04-28 ENCOUNTER — Inpatient Hospital Stay: Admission: RE | Admit: 2023-04-28 | Payer: 59 | Source: Home / Self Care | Admitting: Vascular Surgery

## 2023-04-28 ENCOUNTER — Encounter: Admission: RE | Payer: Self-pay | Source: Home / Self Care

## 2023-04-28 DIAGNOSIS — F1290 Cannabis use, unspecified, uncomplicated: Secondary | ICD-10-CM

## 2023-04-28 HISTORY — DX: Gout, unspecified: M10.9

## 2023-04-28 HISTORY — DX: Atherosclerosis of aorta: I70.0

## 2023-04-28 HISTORY — DX: Other seasonal allergic rhinitis: J30.2

## 2023-04-28 HISTORY — DX: Insomnia, unspecified: G47.00

## 2023-04-28 HISTORY — DX: Other cervical disc degeneration, unspecified cervical region: M50.30

## 2023-04-28 HISTORY — DX: Gastro-esophageal reflux disease without esophagitis: K21.9

## 2023-04-28 HISTORY — DX: Other problems related to lifestyle: Z72.89

## 2023-04-28 HISTORY — DX: Anxiety disorder, unspecified: F41.9

## 2023-04-28 HISTORY — DX: Cannabis use, unspecified, uncomplicated: F12.90

## 2023-04-28 HISTORY — DX: Type 2 diabetes mellitus without complications: E11.9

## 2023-04-28 HISTORY — DX: Other specified health status: Z78.9

## 2023-04-28 HISTORY — DX: Other forms of angina pectoris: I20.89

## 2023-04-28 SURGERY — ENDARTERECTOMY, FEMORAL
Anesthesia: General

## 2023-04-30 ENCOUNTER — Inpatient Hospital Stay: Admit: 2023-04-30 | Payer: 59 | Admitting: Vascular Surgery

## 2023-04-30 SURGERY — ENDARTERECTOMY, FEMORAL
Anesthesia: General | Laterality: Left

## 2023-05-19 ENCOUNTER — Other Ambulatory Visit: Payer: Self-pay | Admitting: Family Medicine

## 2023-05-21 ENCOUNTER — Encounter: Payer: Self-pay | Admitting: Pulmonary Disease

## 2023-05-27 ENCOUNTER — Encounter (INDEPENDENT_AMBULATORY_CARE_PROVIDER_SITE_OTHER): Payer: 59

## 2023-05-27 DIAGNOSIS — G4733 Obstructive sleep apnea (adult) (pediatric): Secondary | ICD-10-CM | POA: Diagnosis not present

## 2023-05-27 NOTE — Telephone Encounter (Signed)
HST 05/09/23 >> AHI 8.2, SpO2 low 79%.   Please let him know his sleep study showed mild obstructive sleep apnea.  He needs an ROV scheduled with Dr. Vassie Loll, Dr. Wynona Neat, Dr. Maple Hudson or one of the NPs to discuss treatment options in more detail.

## 2023-05-31 ENCOUNTER — Ambulatory Visit: Payer: 59 | Admitting: Podiatry

## 2023-05-31 ENCOUNTER — Encounter: Payer: Self-pay | Admitting: Podiatry

## 2023-05-31 DIAGNOSIS — M2011 Hallux valgus (acquired), right foot: Secondary | ICD-10-CM

## 2023-05-31 DIAGNOSIS — M79674 Pain in right toe(s): Secondary | ICD-10-CM

## 2023-05-31 DIAGNOSIS — M2012 Hallux valgus (acquired), left foot: Secondary | ICD-10-CM | POA: Diagnosis not present

## 2023-05-31 DIAGNOSIS — L84 Corns and callosities: Secondary | ICD-10-CM | POA: Diagnosis not present

## 2023-05-31 DIAGNOSIS — B353 Tinea pedis: Secondary | ICD-10-CM

## 2023-05-31 DIAGNOSIS — M79675 Pain in left toe(s): Secondary | ICD-10-CM

## 2023-05-31 DIAGNOSIS — B351 Tinea unguium: Secondary | ICD-10-CM

## 2023-05-31 DIAGNOSIS — E119 Type 2 diabetes mellitus without complications: Secondary | ICD-10-CM

## 2023-05-31 NOTE — Progress Notes (Signed)
ANNUAL DIABETIC FOOT EXAM  Subjective: Kevin Huerta presents today annual diabetic foot exam. Patient states he has prescription for Lotrisone Cream prescribed from last visit, but has not been consistently using the cream. Chief Complaint  Patient presents with   Nail Problem    DFC,Referring Provider Dana Allan, MD,lov:unknown,BS:unknown,   A1C:8.2   Patient confirms h/o diabetes.  Patient denies any h/o foot wounds.  Patient has been diagnosed PAD and has had intervention on RLE with stent placement. He is scheduled for intervention of LLE in October.  Risk factors: diabetes, history of gout, PAD with intermittent claudication, HTN, CAD, hyperlipidemia, vapes weekly .  Dana Allan, MD is patient's PCP.  Past Medical History:  Diagnosis Date   Anginal equivalent    Anxiety    a.) on BZO (alprazolam) PRN   Aortic atherosclerosis (HCC)    Arthritis    Asthma    Atherosclerosis of native artery of both lower extremities with intermittent claudication (HCC)    a.) s/p PTA of RIGHT prox peroneal, proxSFA, popliteal, and distal SFA; 6 mm x 15 cm Viabahn stent to RIGHT distal SFA and above knee popliteal artery; b.) s/p unsuccessful PTA of occluded SFA 03/22/2023 --> plans for endarterectomy +/-  endoluminal intervention   BRBPR (bright red blood per rectum) 05/24/2020   Chest heaviness 10/27/2022   Coronary artery disease of native artery of native heart with stable angina pectoris (HCC) 03/29/2023   a.) cCTA 03/29/2023: Ca2+ 476 (95th percentile for age/sex/race matched control)   Current vaping on some days    DDD (degenerative disc disease), cervical    a.) s/p ACDF C3-C5 2014   Dermatitis 06/02/2021   Diarrhea 02/26/2021   Emphysema lung (HCC)    Fatty liver 09/03/2020   GERD (gastroesophageal reflux disease)    Gout    Heart murmur    History of cardiac catheterization 11/27/2008   a.) LHC 11/27/2008 at [facility] --> normal coronary anatomy with no  obstructive CAD   Hyperlipidemia    Hypersomnia 07/17/2014   Hypertension    Insomnia    a.) uses melatonin +/- trazodone PRN   Leg cramps 02/26/2021   Long term current use of antithrombotics/antiplatelets    a.) clopidogrel   Marijuana use, episodic    Occasional alcohol consumption    OSA (obstructive sleep apnea)    a.) not currently on nocturnal PAP therapy; needs repeat PSG   Seasonal allergies    T2DM (type 2 diabetes mellitus) (HCC)    a.) monitors with Freestyle Libre CGM   Tinnitus    Patient Active Problem List   Diagnosis Date Noted   Marijuana use, episodic 04/12/2023   Tinnitus aurium, left 11/27/2022   DOE (dyspnea on exertion) 10/27/2022   Bronchitis 06/26/2022   Chronic bilateral low back pain with right-sided sciatica 06/15/2022   Tinea 05/07/2022   Right wrist pain 02/26/2021   Pain due to onychomycosis of toenails of both feet 01/30/2021   Atherosclerosis of aorta (HCC) 11/07/2020   Coronary artery disease 11/07/2020   Emphysema lung (HCC) 11/07/2020   History of tobacco use 10/15/2020   Hyperlipidemia associated with type 2 diabetes mellitus (HCC) 10/15/2020   Insomnia 10/15/2020   Fatty liver 09/03/2020   Left wrist pain 01/29/2020   Chronic arthropathy 01/29/2020   Chronic gouty arthropathy 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 11/24/2017   OSA (obstructive sleep apnea) 07/17/2014   Snoring 07/17/2014   Cervical radiculopathy 01/22/2011   Hypertension associated with diabetes (HCC) 01/22/2011   Type 2  diabetes mellitus without complication, without long-term current use of insulin (HCC) 01/22/2011   Eczema 04/15/2010   Hyperlipidemia LDL goal <100 03/13/2010   Generalized anxiety disorder 12/17/2009   Allergic rhinitis 07/30/2009   Asthma 07/29/2009   Depression 03/31/2007   Gout 03/31/2007   Herpes simplex 03/31/2007   Past Surgical History:  Procedure Laterality Date   ANTERIOR CERVICAL DECOMP/DISCECTOMY FUSION N/A 2014   KNEE SURGERY Right    LEFT HEART CATH  AND CORONARY ANGIOGRAPHY Left 11/27/2008   Procedure: LEFT HEART CATH AND CORONARY ANGIOGRAPHY; Location: [facility]; Surgeon: Shari Prows, MD   LOWER EXTREMITY ANGIOGRAPHY Right 11/16/2022   Procedure: Lower Extremity Angiography;  Surgeon: Annice Needy, MD;  Location: [facility] INVASIVE CV LAB;  Service: Cardiovascular;  Laterality: Right;   LOWER EXTREMITY ANGIOGRAPHY Left 03/22/2023   Procedure: Lower Extremity Angiography;  Surgeon: Annice Needy, MD;  Location: [facility] INVASIVE CV LAB;  Service: Cardiovascular;  Laterality: Left;   ROTATOR CUFF REPAIR     WISDOM TOOTH EXTRACTION     Current Outpatient Medications on File Prior to Visit  Medication Sig Dispense Refill   albuterol (VENTOLIN HFA) 108 (90 Base) MCG/ACT inhaler USE 2 INHALATIONS EVERY 6 HOURS AS NEEDED FOR WHEEZING OR SHORTNESS OF BREATH 8.5 g 10   allopurinol (ZYLOPRIM) 100 MG tablet Take 200 mg by mouth daily.     ALPRAZolam (XANAX) 0.5 MG tablet Take 0.5 mg by mouth as needed.     atorvastatin (LIPITOR) 80 MG tablet Take 1 tablet (80 mg total) by mouth daily. 90 tablet 3   Blood Glucose Monitoring Suppl (ONETOUCH VERIO FLEX SYSTEM) w/Device KIT by Does not apply route.     cetirizine (ZYRTEC) 10 MG tablet Take 10 mg by mouth daily.     clopidogrel (PLAVIX) 75 MG tablet Take 1 tablet (75 mg total) by mouth daily. 30 tablet 11   clotrimazole-betamethasone (LOTRISONE) cream Apply 1 Application topically daily. 30 g 0   colchicine 0.6 MG tablet Take 0.6 mg by mouth as needed.     Continuous Blood Gluc Sensor (FREESTYLE LIBRE 2 SENSOR) MISC USE TO CHECK GLUCOSE THREE TIMES A DAY 2 each 12   fluticasone (FLONASE) 50 MCG/ACT nasal spray Place 2 sprays into both nostrils daily. 16 g 6   Fluticasone Furoate (ARNUITY ELLIPTA) 100 MCG/ACT AEPB Inhale 1 puff into the lungs daily in the afternoon. 180 each 2   glipiZIDE (GLUCOTROL XL) 10 MG 24 hr tablet Take 10 mg by mouth.     ipratropium (ATROVENT) 0.06 % nasal  spray Place 2 sprays into both nostrils 4 (four) times daily. 15 mL 12   ketoconazole (NIZORAL) 2 % cream APPLY TOPICALLY TWICE A DAY AS NEEDED     Melatonin 10 MG CAPS Take 10 mg by mouth every evening.     Multiple Vitamin (MULTI-VITAMIN) tablet Take by mouth.     omeprazole (PRILOSEC) 40 MG capsule TAKE 1 CAPSULE (40 MG TOTAL) BY MOUTH IN THE MORNING AND AT BEDTIME. 180 capsule 1   OneTouch Delica Lancets 33G MISC by Does not apply route.     ONETOUCH VERIO test strip      pioglitazone (ACTOS) 15 MG tablet Take 30 mg by mouth daily.     Semaglutide, 2 MG/DOSE, (OZEMPIC, 2 MG/DOSE,) 8 MG/3ML SOPN Inject into the skin.     SYNJARDY XR 12.02-999 MG TB24 Take by mouth.     traZODone (DESYREL) 100 MG tablet Take 50 mg by mouth at  bedtime.     valsartan (DIOVAN) 320 MG tablet TAKE 1 TABLET BY MOUTH EVERY DAY 90 tablet 3   HYDROcodone-acetaminophen (NORCO/VICODIN) 5-325 MG tablet Take 1 tablet by mouth every 4 (four) hours as needed for moderate pain. 20 tablet 0   promethazine-dextromethorphan (PROMETHAZINE-DM) 6.25-15 MG/5ML syrup Take 5 mLs by mouth 4 (four) times daily as needed for cough. 118 mL 0   No current facility-administered medications on file prior to visit.    Allergies  Allergen Reactions   Aspirin Tinitus   Social History   Occupational History   Occupation: IT   Tobacco Use   Smoking status: Former    Current packs/day: 0.00    Average packs/day: 1.5 packs/day for 32.0 years (48.0 ttl pk-yrs)    Types: Cigarettes, E-cigarettes    Start date: 06/11/1986    Quit date: 06/11/2018    Years since quitting: 4.9   Smokeless tobacco: Former    Types: Snuff    Quit date: 1996   Tobacco comments:    I vape weekly."  Vaping Use   Vaping status: Some Days   Substances: Nicotine   Devices: x3 per day to quit smoking nicotine   Substance and Sexual Activity   Alcohol use: Yes    Comment: cut back, 6 pack per month   Drug use: Not Currently    Types: Marijuana    Comment: a  few times a year   Sexual activity: Yes    Partners: Female    Birth control/protection: None   Family History  Problem Relation Age of Onset   Heart attack Father 65   Alcohol abuse Father    Drug abuse Father    Heart disease Father    Lung cancer Father    Hypertension Mother    Heart attack Maternal Grandmother 42   Stroke Maternal Aunt 65   Immunization History  Administered Date(s) Administered   Hepatitis A 06/17/1967   Hepatitis B Apr 11, 1966   Influenza Inj Mdck Quad Pf 08/23/2018   Influenza, Seasonal, Injecte, Preservative Fre 07/22/2012, 07/20/2015, 06/22/2016, 08/23/2018   Influenza,inj,Quad PF,6+ Mos 05/24/2020, 06/01/2022   Influenza,trivalent, recombinat, inj, PF 08/28/2013, 07/02/2014   Influenza-Unspecified 07/22/2012   MMR 06/17/1967   Moderna Sars-Covid-2 Vaccination 11/27/2019, 01/02/2020, 08/21/2020   Pneumococcal Polysaccharide-23 08/25/2012   Pneumococcal-Unspecified 09/25/2003, 08/25/2012   Td 06/29/2006, 12/23/2016   Tdap 06/29/2006, 12/23/2016   Unspecified SARS-COV-2 Vaccination 01/17/2021, 07/21/2021   Varicella 06/17/1979   Zoster Recombinant(Shingrix) 07/03/2021, 10/16/2021   Zoster, Live 06/16/2016     Review of Systems: Negative except as noted in the HPI.   Objective: There were no vitals filed for this visit.  Kevin Huerta is a pleasant 57 y.o. male in NAD. AAO X 3.  Vascular Examination: Capillary refill time <3 seconds b/l. Vascular status intact b/l with palpable DP pulses. Right PT pulses palpable. Left PT pulse nonpalpable. No edema b/l. Pedal hair absent b/l. No pain with calf compression b/l. Skin temperature gradient WNL b/l. No cyanosis or clubbing b/l. No ischemia or gangrene noted b/l.   Neurological Examination: Sensation grossly intact b/l with 10 gram monofilament. Vibratory sensation intact b/l.   Dermatological Examination: Pedal skin with normal turgor, texture and tone b/l.  No open wounds. No interdigital  macerations.   Toenails 1-5 b/l thick, discolored, elongated with subungual debris and pain on dorsal palpation.   Hyperkeratotic lesion(s) bilateral great toes.  No erythema, no edema, no drainage, no fluctuance. Diffuse scaling noted peripherally and plantarly b/l feet.  No interdigital macerations.  No blisters, no weeping. No signs of secondary bacterial infection noted.  Musculoskeletal Examination: Muscle strength 5/5 to all lower extremity muscle groups bilaterally. HAV with bunion deformity noted b/l LE. Pes planus deformity noted bilateral LE. Patient ambulates independent of any assistive aids.  Radiographs: None  ADA Risk Categorization: Low Risk :  Patient has all of the following: Intact protective sensation No prior foot ulcer  No severe deformity Pedal pulses present  Assessment: 1. Pain due to onychomycosis of toenails of both feet [B35.1, M79.675, M79.674]   2. Tinea pedis of both feet   3. Callus   4. Hallux valgus, acquired, bilateral   5. Type 2 diabetes mellitus without complication, without long-term current use of insulin (HCC)   6. Encounter for diabetic foot exam [facility])     Plan: -Patient was evaluated and treated. All patient's and/or POA's questions/concerns answered on today's visit. -Patient has known h/o PAD and is scheduled for intervention of LLE July 07, 2023. -Diabetic foot examination performed today. -Continue foot and shoe inspections daily. Monitor blood glucose per PCP/Endocrinologist's recommendations. -Patient to continue soft, supportive shoe gear daily. -Toenails 1-5 b/l were debrided in length and girth with sterile nail nippers and dremel without iatrogenic bleeding.  -Discussed tinea pedis infection. To prevent re-infection of tinea pedis, patient/POA/caregiver instructed to spray shoes with Lysol every evening and clean tub/shower with bleach based cleanser. -Patient encouraged to use Lotrisone cream twice daily consistently for 6  weeks. He related understanding. -Patient/POA to call should there be question/concern in the interim. Return in about 3 months (around 08/31/2023).  Freddie Breech, DPM

## 2023-06-07 ENCOUNTER — Other Ambulatory Visit: Payer: Self-pay | Admitting: Podiatry

## 2023-06-28 ENCOUNTER — Other Ambulatory Visit (INDEPENDENT_AMBULATORY_CARE_PROVIDER_SITE_OTHER): Payer: Self-pay | Admitting: Nurse Practitioner

## 2023-06-28 ENCOUNTER — Ambulatory Visit (INDEPENDENT_AMBULATORY_CARE_PROVIDER_SITE_OTHER): Payer: 59

## 2023-06-28 ENCOUNTER — Ambulatory Visit (INDEPENDENT_AMBULATORY_CARE_PROVIDER_SITE_OTHER): Payer: 59 | Admitting: Nurse Practitioner

## 2023-06-28 ENCOUNTER — Encounter (INDEPENDENT_AMBULATORY_CARE_PROVIDER_SITE_OTHER): Payer: Self-pay | Admitting: Nurse Practitioner

## 2023-06-28 VITALS — BP 126/86 | HR 76 | Resp 16 | Wt 243.6 lb

## 2023-06-28 DIAGNOSIS — E785 Hyperlipidemia, unspecified: Secondary | ICD-10-CM | POA: Diagnosis not present

## 2023-06-28 DIAGNOSIS — I739 Peripheral vascular disease, unspecified: Secondary | ICD-10-CM

## 2023-06-28 DIAGNOSIS — I70222 Atherosclerosis of native arteries of extremities with rest pain, left leg: Secondary | ICD-10-CM

## 2023-06-28 DIAGNOSIS — Z9889 Other specified postprocedural states: Secondary | ICD-10-CM

## 2023-06-28 DIAGNOSIS — E119 Type 2 diabetes mellitus without complications: Secondary | ICD-10-CM

## 2023-06-28 DIAGNOSIS — E1169 Type 2 diabetes mellitus with other specified complication: Secondary | ICD-10-CM

## 2023-06-28 LAB — VAS US ABI WITH/WO TBI
Left ABI: 0.88
Right ABI: 1.09

## 2023-06-28 NOTE — Progress Notes (Addendum)
Subjective:    Patient ID: Kevin Huerta, male    DOB: 1965/10/23, 57 y.o.   MRN: [id number] Chief Complaint  Patient presents with   Follow-up    Ultrasound follow up    The patient returns to the office for followup and review status post angiogram with intervention on 03/22/2023.   Procedure:  Procedure(s) Performed:             1.  Ultrasound guidance for vascular access right femoral artery             2.  Catheter placement into left SFA from right femoral approach             3.  Aortogram and selective left lower extremity angiogram             4.  StarClose closure device right femoral artery   The patient was unable to undergo intervention as it was found that his SFA occlusion was not amenable to endovascular intervention.  Since that time the patient has developed worsening symptoms of claudication which are disabling for him.  He also has begun to develop some mild rest pain.  There have been no developments of open wounds or ulcerations  There have been no significant changes to the patient's overall health care.  No documented history of amaurosis fugax or recent TIA symptoms. There are no recent neurological changes noted. No documented history of DVT, PE or superficial thrombophlebitis. The patient denies recent episodes of angina or shortness of breath.   ABI's Rt=1.09 and Lt=0.88  (previous ABI's Rt=1.02 and Lt=0.93)     Review of Systems  Cardiovascular:        Significant claudication with mild rest pain  All other systems reviewed and are negative.      Objective:   Physical Exam Vitals reviewed.  HENT:     Head: Normocephalic.  Cardiovascular:     Rate and Rhythm: Normal rate.     Pulses:          Dorsalis pedis pulses are detected w/ Doppler on the right side and detected w/ Doppler on the left side.       Posterior tibial pulses are detected w/ Doppler on the right side and detected w/ Doppler on the left side.  Pulmonary:     Effort:  Pulmonary effort is normal.  Skin:    General: Skin is warm and dry.  Neurological:     Mental Status: He is alert and oriented to person, place, and time.  Psychiatric:        Mood and Affect: Mood normal.        Behavior: Behavior normal.        Thought Content: Thought content normal.        Judgment: Judgment normal.     BP 126/86 (BP Location: Left Arm)   Pulse 76   Resp 16   Wt 243 lb 9.6 oz (110.5 kg)   BMI 35.97 kg/m   Past Medical History:  Diagnosis Date   Anginal equivalent    Anxiety    a.) on BZO (alprazolam) PRN   Aortic atherosclerosis (HCC)    Arthritis    Asthma    Atherosclerosis of native artery of both lower extremities with intermittent claudication (HCC)    a.) s/p PTA of RIGHT prox peroneal, proxSFA, popliteal, and distal SFA; 6 mm x 15 cm Viabahn stent to RIGHT distal SFA and above knee popliteal artery; b.) s/p unsuccessful PTA of occluded  SFA 03/22/2023 --> plans for endarterectomy +/-  endoluminal intervention   BRBPR (bright red blood per rectum) 05/24/2020   Chest heaviness 10/27/2022   Coronary artery disease of native artery of native heart with stable angina pectoris (HCC) 03/29/2023   a.) cCTA 03/29/2023: Ca2+ 476 (95th percentile for age/sex/race matched control)   Current vaping on some days    DDD (degenerative disc disease), cervical    a.) s/p ACDF C3-C5 2014   Dermatitis 06/02/2021   Diarrhea 02/26/2021   Emphysema lung (HCC)    Fatty liver 09/03/2020   GERD (gastroesophageal reflux disease)    Gout    Heart murmur    History of cardiac catheterization 11/27/2008   a.) LHC 11/27/2008 [facility] --> normal coronary anatomy with no obstructive CAD   Hyperlipidemia    Hypersomnia 07/17/2014   Hypertension    Insomnia    a.) uses melatonin +/- trazodone PRN   Leg cramps 02/26/2021   Long term current use of antithrombotics/antiplatelets    a.) clopidogrel   Marijuana use, episodic    Occasional alcohol consumption     OSA (obstructive sleep apnea)    a.) not currently on nocturnal PAP therapy; needs repeat PSG   Seasonal allergies    T2DM (type 2 diabetes mellitus) (HCC)    a.) monitors with Freestyle Libre CGM   Tinnitus     Social History   Socioeconomic History   Marital status: Married    Spouse name: leslie   Number of children: 3   Years of education: Not on file   Highest education level: Not on file  Occupational History   Occupation: IT   Tobacco Use   Smoking status: Former    Current packs/day: 0.00    Average packs/day: 1.5 packs/day for 32.0 years (48.0 ttl pk-yrs)    Types: Cigarettes, E-cigarettes    Start date: 06/11/1986    Quit date: 06/11/2018    Years since quitting: 5.0   Smokeless tobacco: Former    Types: Snuff    Quit date: 1996   Tobacco comments:    I vape weekly."  Vaping Use   Vaping status: Some Days   Substances: Nicotine   Devices: x3 per day to quit smoking nicotine   Substance and Sexual Activity   Alcohol use: Yes    Comment: cut back, 6 pack per month   Drug use: Not Currently    Types: Marijuana    Comment: a few times a year   Sexual activity: Yes    Partners: Female    Birth control/protection: None  Other Topics Concern   Not on file  Social History Narrative   10/15/20   From: [place] originally, but in the area since 2000   Living: with wife, Verlon Au (2003) and granddaughter   Work: Scientist, 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[employer]      Family: 3 daughters - Irish Lack, Toni Amend (just finished law school), [place], and granddaughter McKenzie (2011)      Enjoys: travel when able, beach, cycling      Exercise: exercise bike at home   Diet: does not follow diabetic diet, lower appetite with ozempic      Safety   Seat belts: Yes    Guns: Yes  and secure   Safe in relationships: Yes    Social Determinants of Health   Financial Resource Strain: Low Risk  (10/07/2020)   Overall Financial Resource Strain (CARDIA)    Difficulty of Paying Living Expenses:  Not hard  at all  Food Insecurity: No Food Insecurity (03/11/2018)   Received from [facility], [facility]   Hunger Vital Sign    Worried About Running Out of Food in the Last Year: Never true    Ran Out of Food in the Last Year: Never true  Transportation Needs: No Transportation Needs (03/11/2018)   Received from [facility], [facility]   [facility] - Transportation    Lack of Transportation (Medical): No    Lack of Transportation (Non-Medical): No  Physical Activity: Insufficiently Active (03/11/2018)   Received from [facility], [facility]   Exercise Vital Sign    Days of Exercise per Week: 1 day    Minutes of Exercise per Session: 20 min  Stress: No Stress Concern Present (03/11/2018)   Received from [facility], [facility] - Occupational Stress Questionnaire    Feeling of Stress : Only a little  Social Connections: Unknown (02/13/2022)   Received from [facility], [facility]   Social Network    Social Network: Not on file  Intimate Partner Violence: Unknown (01/06/2022)   Received from [facility], [facility]   HITS    Physically Hurt: Not on file    Insult or Talk Down To: Not on file    Threaten Physical Harm: Not on file    Scream or Curse: Not on file    Past Surgical History:  Procedure Laterality Date   ANTERIOR CERVICAL DECOMP/DISCECTOMY FUSION N/A 2014   KNEE SURGERY Right    LEFT HEART CATH AND CORONARY ANGIOGRAPHY Left 11/27/2008   Procedure: LEFT HEART CATH AND CORONARY ANGIOGRAPHY; Location: [facility]; Surgeon: Shari Prows, MD   LOWER EXTREMITY ANGIOGRAPHY Right 11/16/2022   Procedure: Lower Extremity Angiography;  Surgeon: Annice Needy, MD;  Location: [facility] INVASIVE CV LAB;  Service: Cardiovascular;  Laterality: Right;   LOWER EXTREMITY ANGIOGRAPHY Left 03/22/2023   Procedure: Lower Extremity Angiography;  Surgeon: Annice Needy, MD;  Location: [facility] INVASIVE CV LAB;   Service: Cardiovascular;  Laterality: Left;   ROTATOR CUFF REPAIR     WISDOM TOOTH EXTRACTION      Family History  Problem Relation Age of Onset   Heart attack Father 70   Alcohol abuse Father    Drug abuse Father    Heart disease Father    Lung cancer Father    Hypertension Mother    Heart attack Maternal Grandmother 45   Stroke Maternal Aunt 73    Allergies  Allergen Reactions   Aspirin Tinitus       Latest Ref Rng & Units 04/06/2023    2:43 PM 10/27/2022    9:48 AM 02/26/2021   10:02 AM  CBC  WBC 4.0 - 10.5 K/uL 5.0  4.5  5.2   Hemoglobin 13.0 - 17.0 g/dL 16.1  09.6  04.5   Hematocrit 39.0 - 52.0 % 44.3  47.2  43.6   Platelets 150 - 400 K/uL 283  300.0  291.0       CMP     Component Value Date/Time   NA 139 04/06/2023 1443   K 4.2 04/06/2023 1443   CL 105 04/06/2023 1443   CO2 25 04/06/2023 1443   GLUCOSE 147 (H) 04/06/2023 1443   BUN 15 04/06/2023 1443   CREATININE 1.00 04/06/2023 1443   CALCIUM 9.6 04/06/2023 1443   PROT 7.7 10/27/2022 0948   ALBUMIN 5.1 10/27/2022 0948   AST 22 10/27/2022 0948  ALT 35 10/27/2022 0948   ALKPHOS 81 10/27/2022 0948   BILITOT 0.3 10/27/2022 0948   GFR 83.22 10/27/2022 0948   EGFR 7.9 02/17/2022 0000   GFRNONAA >60 04/06/2023 1443     VAS [place] ABI WITH/WO TBI  Result Date: 06/28/2023  LOWER EXTREMITY DOPPLER STUDY Patient Name:  Aleksy Barrilleaux  Date of Exam:   06/28/2023 Medical Rec #: [id number]       Accession #:    [id number] Date of Birth: 1966-09-16       Patient Gender: M Patient Age:   16 years Exam Location:  [facility] Procedure:      VAS [place] ABI WITH/WO TBI Referring Phys: Festus Barren --------------------------------------------------------------------------------  Indications: Claudication, and peripheral artery disease.  Vascular Interventions: 11/16/2022: Aotogram and Selective Right Lower Extremity                         Angiogram. PTAof the Right Proximal Peroneal Artery,                          Tibioperoneal trunk with 3 mm diameter angioplasty                         balloon. PTA of the Right CFA and Proximal SFA with 5 mm                         diameter Lutonix drug coated angioplasty balloon. PTA of                         the Right Popliteal Artery and Distal SFA with 4 mm                         diameter by 15 cm length Lutonix drug coated angioplasty                         balloon. Viabahnstent placement to the Right most distal                         SFA and above knee Popliteal Artery with 6 mm diameter                         by 15 cm length Viabahn stent for greater than 50%                         residual stenosis after angioplasty.                          03/22/2023: Aortogram and Selective Left Lower Extremity                         Angiogram. Comparison Study: 03/15/2023 Performing Technologist: Debbe Bales RVS  Examination Guidelines: A complete evaluation includes at minimum, Doppler waveform signals and systolic blood pressure reading at the level of bilateral brachial, anterior tibial, and posterior tibial arteries, when vessel segments are accessible. Bilateral testing is considered an integral part of a complete examination. Photoelectric Plethysmograph (PPG) waveforms and toe systolic pressure readings are included as required and additional duplex testing as needed. Limited examinations for reoccurring indications may  be performed as noted.  ABI Findings: +---------+------------------+-----+---------+--------+ Right    Rt Pressure (mmHg)IndexWaveform Comment  +---------+------------------+-----+---------+--------+ Brachial 138                                      +---------+------------------+-----+---------+--------+ ATA      137               0.99 triphasic         +---------+------------------+-----+---------+--------+ PTA      150               1.09 triphasic         +---------+------------------+-----+---------+--------+ Great Toe126                0.91 Normal            +---------+------------------+-----+---------+--------+ +---------+------------------+-----+--------+-------+ Left     Lt Pressure (mmHg)IndexWaveformComment +---------+------------------+-----+--------+-------+ Brachial 134                                    +---------+------------------+-----+--------+-------+ ATA      115               0.83 biphasic        +---------+------------------+-----+--------+-------+ PTA      122               0.88 biphasic        +---------+------------------+-----+--------+-------+ Great Toe111               0.80 Normal          +---------+------------------+-----+--------+-------+ +-------+-----------+-----------+------------+------------+ ABI/TBIToday's ABIToday's TBIPrevious ABIPrevious TBI +-------+-----------+-----------+------------+------------+ Right  1.09       .91        1.02        1.22         +-------+-----------+-----------+------------+------------+ Left   .88        .80        .93         1.18         +-------+-----------+-----------+------------+------------+ Left ABIs and TBIs appear decreased compared to prior study on 03/15/2023. Right ABIs appear essentially unchanged compared to prior study on 03/15/2023. Right TBIs appear to be decreased compared to prior study on 03/15/2023.  Summary: Right: Resting right ankle-brachial index is within normal range. The right toe-brachial index is normal. Left: Resting left ankle-brachial index indicates mild left lower extremity arterial disease. The left toe-brachial index is normal. *See table(s) above for measurements and observations.  Electronically signed by Festus Barren MD on 06/28/2023 at 2:18:05 PM.    Final        Assessment & Plan:   1. Atherosclerosis of native artery of left lower extremity with rest pain (HCC)  Recommend:  The patient has evidence of severe atherosclerotic changes of both lower extremities associated with disabling  claudication and developing rest pain of his left lower extremity.    Angiography has been performed and the situation is not ideal for intervention.  Given this finding open surgical repair is recommended.   Attempts to cross the long SFA occlusion at the time of angiogram were unsuccessful. However, with his common femoral disease he needs a hybrid procedure with a femoral endarterectomy as well as potential endoluminal intervention for the SFA occlusion.   The risks and benefits as well as the alternative therapies was discussed in detail with  the patient.  All questions were answered.  Patient agrees to proceed with open vascular surgical reconstruction.  Open vascular surgical reconstruction is required to be an inpatient procedure.  Given the patient's comorbidities of asthma with evidence of emphysema on scans in addition to coronary calcifications, it would be in his best interest to the inpatient to ensure no significant issues occur postsurgery.  Typically this is an average 1-2 night stay, provided no complications. The patient will follow up with me in the office after the procedure.    2. Type 2 diabetes mellitus without complication, without long-term current use of insulin (HCC) Continue hypoglycemic medications as already ordered, these medications have been reviewed and there are no changes at this time.  Hgb A1C to be monitored as already arranged by primary service  3. Hyperlipidemia associated with type 2 diabetes mellitus (HCC) Continue statin as ordered and reviewed, no changes at this time   Current Outpatient Medications on File Prior to Visit  Medication Sig Dispense Refill   albuterol (VENTOLIN HFA) 108 (90 Base) MCG/ACT inhaler USE 2 INHALATIONS EVERY 6 HOURS AS NEEDED FOR WHEEZING OR SHORTNESS OF BREATH 8.5 g 10   allopurinol (ZYLOPRIM) 100 MG tablet Take 200 mg by mouth daily.     ALPRAZolam (XANAX) 0.5 MG tablet Take 0.5 mg by mouth as needed.     atorvastatin  (LIPITOR) 80 MG tablet Take 1 tablet (80 mg total) by mouth daily. 90 tablet 3   Blood Glucose Monitoring Suppl (ONETOUCH VERIO FLEX SYSTEM) w/Device KIT by Does not apply route.     cetirizine (ZYRTEC) 10 MG tablet Take 10 mg by mouth daily.     clopidogrel (PLAVIX) 75 MG tablet Take 1 tablet (75 mg total) by mouth daily. 30 tablet 11   clotrimazole-betamethasone (LOTRISONE) cream APPLY 1 APPLICATION TOPICALLY DAILY 30 g 0   colchicine 0.6 MG tablet Take 0.6 mg by mouth as needed.     Continuous Blood Gluc Sensor (FREESTYLE LIBRE 2 SENSOR) MISC USE TO CHECK GLUCOSE THREE TIMES A DAY 2 each 12   fluticasone (FLONASE) 50 MCG/ACT nasal spray Place 2 sprays into both nostrils daily. 16 g 6   Fluticasone Furoate (ARNUITY ELLIPTA) 100 MCG/ACT AEPB Inhale 1 puff into the lungs daily in the afternoon. 180 each 2   glipiZIDE (GLUCOTROL XL) 10 MG 24 hr tablet Take 10 mg by mouth.     ipratropium (ATROVENT) 0.06 % nasal spray Place 2 sprays into both nostrils 4 (four) times daily. 15 mL 12   ketoconazole (NIZORAL) 2 % cream APPLY TOPICALLY TWICE A DAY AS NEEDED     Melatonin 10 MG CAPS Take 10 mg by mouth every evening.     Multiple Vitamin (MULTI-VITAMIN) tablet Take by mouth.     omeprazole (PRILOSEC) 40 MG capsule TAKE 1 CAPSULE (40 MG TOTAL) BY MOUTH IN THE MORNING AND AT BEDTIME. 180 capsule 1   OneTouch Delica Lancets 33G MISC by Does not apply route.     ONETOUCH VERIO test strip      pioglitazone (ACTOS) 15 MG tablet Take 30 mg by mouth daily.     Semaglutide, 2 MG/DOSE, (OZEMPIC, 2 MG/DOSE,) 8 MG/3ML SOPN Inject into the skin.     SYNJARDY XR 12.02-999 MG TB24 Take by mouth.     traZODone (DESYREL) 100 MG tablet Take 50 mg by mouth at bedtime.     valsartan (DIOVAN) 320 MG tablet TAKE 1 TABLET BY MOUTH EVERY DAY 90 tablet 3  HYDROcodone-acetaminophen (NORCO/VICODIN) 5-325 MG tablet Take 1 tablet by mouth every 4 (four) hours as needed for moderate pain. 20 tablet 0    promethazine-dextromethorphan (PROMETHAZINE-DM) 6.25-15 MG/5ML syrup Take 5 mLs by mouth 4 (four) times daily as needed for cough. 118 mL 0   No current facility-administered medications on file prior to visit.    There are no Patient Instructions on file for this visit. No follow-ups on file.   Georgiana Spinner, NP

## 2023-07-02 ENCOUNTER — Ambulatory Visit (INDEPENDENT_AMBULATORY_CARE_PROVIDER_SITE_OTHER): Payer: 59 | Admitting: Nurse Practitioner

## 2023-07-02 ENCOUNTER — Encounter (INDEPENDENT_AMBULATORY_CARE_PROVIDER_SITE_OTHER): Payer: 59

## 2023-07-19 ENCOUNTER — Telehealth: Payer: Self-pay | Admitting: Family Medicine

## 2023-07-19 ENCOUNTER — Telehealth (INDEPENDENT_AMBULATORY_CARE_PROVIDER_SITE_OTHER): Payer: Self-pay

## 2023-07-19 NOTE — Telephone Encounter (Signed)
I spoke with patient spouse and informed that I will speak further with Vernona Rieger when she return in the office about upcoming surgery

## 2023-07-19 NOTE — Telephone Encounter (Signed)
Patient would like another referral to a different surgeon for his leg. He has a blockage in his leg. Patient would like the referral to go [facility] , in [place], Dr Cliffton Asters. [phone].

## 2023-07-19 NOTE — Telephone Encounter (Signed)
Pt's wife called and states that the insurance has came to the decision about pt having the procedure on Wednesday.  Insurance states they will pay for the procedure and a 72 hour observation but not as a in hospital stay unless he needs more time after the 72 hours then they will pick up the in hospital stay this is per [employer]. Pt's wife states they need to know if our office is willing to bill this the way Monia Pouch wants it billed so that we can proceed with the procedure on Wednesday? She states that he has not had a pre-op appt and no one has told him what medications he can or can not take.

## 2023-07-20 ENCOUNTER — Telehealth (INDEPENDENT_AMBULATORY_CARE_PROVIDER_SITE_OTHER): Payer: Self-pay

## 2023-07-20 NOTE — Telephone Encounter (Signed)
Pt wife called about pt referral and would like a call back

## 2023-07-20 NOTE — Telephone Encounter (Signed)
Patient was on the schedule for 07/21/23 and requested to be rescheduled to 08/04/23. Patient has been scheduled and pre-op is on 07/27/23 at 10:00 am at the [facility]. Pre-surgery instructions will be sent to Mychart and mailed.

## 2023-07-21 NOTE — Telephone Encounter (Signed)
I spoke with the patient's wife this morning. She stated she has called the office twice to have a referral sent to [facility], however, she has not received a response. She stated that the vascular place her husband was referred too found a blockage in his right leg and her husband needed surgery. She also stated that it was held up due to prior authorization from [employer]. She informed me that her husband was scheduled for surgery today 07/21/23, however, he did not have pre-op scheduled nor did he speak with an anesthesiologist.I informed the patient's wife if the patient were scheduled with a new vascular surgeon the wait would be even longer, he would have to have the initial consult evaluation and prognosis reached by the other doctor and wait on surgery if surgery was determined. I spoke with our practice administrator after speaking with the patient. She called the patient's wife back and relayed the documentation that was noted in the patient's chart by [facility] of the patient's rescheduled pre-op and surgery date. She explained that it would be best to take those dates to get the patient's surgery sooner than later and his wife agreed. She will follow up with [facility] to confirm the dates for the patient's pre-op and surgery.

## 2023-07-27 ENCOUNTER — Other Ambulatory Visit: Payer: Self-pay

## 2023-07-27 ENCOUNTER — Inpatient Hospital Stay
Admission: RE | Admit: 2023-07-27 | Discharge: 2023-07-27 | Disposition: A | Discharge status: Home / Self Care | Payer: 59 | Source: Ambulatory Visit | Attending: Vascular Surgery | Admitting: Vascular Surgery

## 2023-07-27 VITALS — BP 125/75 | HR 73 | Resp 12 | Ht 69.5 in | Wt 250.0 lb

## 2023-07-27 DIAGNOSIS — G4733 Obstructive sleep apnea (adult) (pediatric): Secondary | ICD-10-CM | POA: Diagnosis not present

## 2023-07-27 DIAGNOSIS — E119 Type 2 diabetes mellitus without complications: Secondary | ICD-10-CM | POA: Diagnosis not present

## 2023-07-27 DIAGNOSIS — K760 Fatty (change of) liver, not elsewhere classified: Secondary | ICD-10-CM | POA: Diagnosis not present

## 2023-07-27 DIAGNOSIS — Z01818 Encounter for other preprocedural examination: Secondary | ICD-10-CM

## 2023-07-27 DIAGNOSIS — I2510 Atherosclerotic heart disease of native coronary artery without angina pectoris: Secondary | ICD-10-CM | POA: Insufficient documentation

## 2023-07-27 DIAGNOSIS — Z01812 Encounter for preprocedural laboratory examination: Secondary | ICD-10-CM | POA: Insufficient documentation

## 2023-07-27 DIAGNOSIS — E785 Hyperlipidemia, unspecified: Secondary | ICD-10-CM | POA: Diagnosis not present

## 2023-07-27 DIAGNOSIS — I152 Hypertension secondary to endocrine disorders: Secondary | ICD-10-CM | POA: Diagnosis not present

## 2023-07-27 DIAGNOSIS — I700 Atherosclerosis of aorta: Secondary | ICD-10-CM | POA: Insufficient documentation

## 2023-07-27 DIAGNOSIS — E1159 Type 2 diabetes mellitus with other circulatory complications: Secondary | ICD-10-CM | POA: Insufficient documentation

## 2023-07-27 LAB — COMPREHENSIVE METABOLIC PANEL
ALT: 29 U/L (ref 0–44)
AST: 21 U/L (ref 15–41)
Albumin: 4.6 g/dL (ref 3.5–5.0)
Alkaline Phosphatase: 67 U/L (ref 38–126)
Anion gap: 10 (ref 5–15)
BUN: 19 mg/dL (ref 6–20)
CO2: 27 mmol/L (ref 22–32)
Calcium: 9.8 mg/dL (ref 8.9–10.3)
Chloride: 103 mmol/L (ref 98–111)
Creatinine, Ser: 1.15 mg/dL (ref 0.61–1.24)
GFR, Estimated: >60 mL/min (ref >60)
Glucose, Bld: 249 mg/dL — ABNORMAL HIGH (ref 70–99)
Potassium: 4.9 mmol/L (ref 3.5–5.1)
Sodium: 140 mmol/L (ref 135–145)
Total Bilirubin: 0.6 mg/dL (ref 0.3–1.2)
Total Protein: 8.1 g/dL (ref 6.5–8.1)

## 2023-07-27 LAB — CBC
HCT: 44.9 % (ref 39.0–52.0)
Hemoglobin: 14.1 g/dL (ref 13.0–17.0)
MCH: 26.7 pg (ref 26.0–34.0)
MCHC: 31.4 g/dL (ref 30.0–36.0)
MCV: 85 fL (ref 80.0–100.0)
Platelets: 257 10*3/uL (ref 150–400)
RBC: 5.28 MIL/uL (ref 4.22–5.81)
RDW: 14.8 % (ref 11.5–15.5)
WBC: 4.2 10*3/uL (ref 4.0–10.5)
nRBC: 0 % (ref 0.0–0.2)

## 2023-07-27 LAB — SURGICAL PCR SCREEN
MRSA, PCR: NEGATIVE
Staphylococcus aureus: NEGATIVE

## 2023-07-27 NOTE — Patient Instructions (Addendum)
Your procedure is scheduled on: Wednesday 08/04/23 To find out your arrival time, please call [phone] between 1PM - 3PM on:   Tuesday 08/03/23 Report to the Registration Desk on the 1st floor of [facility]. Free Valet parking is available.  If your arrival time is 6:00 am, do not arrive before that time as the [facility] entrance doors do not open until 6:00 am.  REMEMBER: Instructions that are not followed completely may result in serious medical risk, up to and including death; or upon the discretion of your surgeon and anesthesiologist your surgery may need to be rescheduled.  Do not eat food after midnight the night before surgery.  No gum chewing or hard candies.  You may however, drink CLEAR liquids up to 2 hours before you are scheduled to arrive for your surgery. Do not drink anything within 2 hours of your scheduled arrival time.  Clear liquids include: - water   One week prior to surgery: Stop Anti-inflammatories (NSAIDS) such as Advil, Aleve, Ibuprofen, Motrin, Naproxen, Naprosyn and Aspirin based products such as Excedrin, Goody's Powder, BC Powder. You may however, continue to take Tylenol if needed for pain up until the day of surgery.  Stop ANY OVER THE COUNTER supplements and vitamins for 7 days until after surgery.  Continue taking all prescribed medications with the exception of the following: Plavix and Ozempic, hold for 7 days. Last dose will be Today 07/27/23. Synjardy last dose will be Saturday 07/31/23   TAKE ONLY THESE MEDICATIONS THE MORNING OF SURGERY WITH A SIP OF WATER:  allopurinol (ZYLOPRIM) 200 MG tablet  atorvastatin (LIPITOR) 80 MG tablet  cetirizine (ZYRTEC) 10 MG tablet  omeprazole (PRILOSEC) 40 MG capsule Antacid (take one the night before and one on the morning of surgery - helps to prevent nausea after surgery.)  Use inhalers (Trellegy) on the day of surgery and bring (Flovent) to the hospital.  No Alcohol for 24 hours before or  after surgery.  No Smoking including e-cigarettes for 24 hours before surgery.  No chewable tobacco products for at least 6 hours before surgery.  No nicotine patches on the day of surgery.  Do not use any "recreational" drugs for at least a week (preferably 2 weeks) before your surgery.  Please be advised that the combination of cocaine and anesthesia may have negative outcomes, up to and including death. If you test positive for cocaine, your surgery will be cancelled.  On the morning of surgery brush your teeth with toothpaste and water, you may rinse your mouth with mouthwash if you wish. Do not swallow any toothpaste or mouthwash.  Use CHG Soap or wipes as directed on instruction sheet.  Do not wear lotions, powders, or perfumes.   Do not shave body hair from the neck down 48 hours before surgery.  Wear comfortable clothing (specific to your surgery type) to the hospital.  Do not wear jewelry, make-up, hairpins, clips or nail polish.  For welded (permanent) jewelry: bracelets, anklets, waist bands, etc.  Please have this removed prior to surgery.  If it is not removed, there is a chance that hospital personnel will need to cut it off on the day of surgery. Contact lenses, hearing aids and dentures may not be worn into surgery.  Do not bring valuables to the hospital. [facility] is not responsible for any missing/lost belongings or valuables.   Notify your doctor if there is any change in your medical condition (cold, fever, infection).  If you are  being discharged the day of surgery, you will not be allowed to drive home. You will need a responsible individual to drive you home and stay with you for 24 hours after surgery.   If you are taking public transportation, you will need to have a responsible individual with you.  If you are being admitted to the hospital overnight, leave your suitcase in the car. After surgery it may be brought to your room.  In case of increased  patient census, it may be necessary for you, the patient, to continue your postoperative care in the Same Day Surgery department.  After surgery, you can help prevent lung complications by doing breathing exercises.  Take deep breaths and cough every 1-2 hours. Your doctor may order a device called an Incentive Spirometer to help you take deep breaths. When coughing or sneezing, hold a pillow firmly against your incision with both hands. This is called "splinting." Doing this helps protect your incision. It also decreases belly discomfort.  Surgery Visitation Policy:  Patients undergoing a surgery or procedure may have two family members or support persons with them as long as the person is not COVID-19 positive or experiencing its symptoms.   Inpatient Visitation:    Visiting hours are 7 a.m. to 8 p.m. Up to four visitors are allowed at one time in a patient room. The visitors may rotate out with other people during the day. One designated support person (adult) may remain overnight.  Please call the Pre-admissions Testing Dept. at [phone] if you have any questions about these instructions.     Preparing for Surgery with CHLORHEXIDINE GLUCONATE (CHG) Soap  Chlorhexidine Gluconate (CHG) Soap  o An antiseptic cleaner that kills germs and bonds with the skin to continue killing germs even after washing  o Used for showering the night before surgery and morning of surgery  Before surgery, you can play an important role by reducing the number of germs on your skin.  CHG (Chlorhexidine gluconate) soap is an antiseptic cleanser which kills germs and bonds with the skin to continue killing germs even after washing.  Please do not use if you have an allergy to CHG or antibacterial soaps. If your skin becomes reddened/irritated stop using the CHG.  1. Shower the NIGHT BEFORE SURGERY and the MORNING OF SURGERY with CHG soap.  2. If you choose to wash your hair, wash your hair first as  usual with your normal shampoo.  3. After shampooing, rinse your hair and body thoroughly to remove the shampoo.  4. Use CHG as you would any other liquid soap. You can apply CHG directly to the skin and wash gently with a scrungie or a clean washcloth.  5. Apply the CHG soap to your body only from the neck down. Do not use on open wounds or open sores. Avoid contact with your eyes, ears, mouth, and genitals (private parts). Wash face and genitals (private parts) with your normal soap.  6. Wash thoroughly, paying special attention to the area where your surgery will be performed.  7. Thoroughly rinse your body with warm water.  8. Do not shower/wash with your normal soap after using and rinsing off the CHG soap.  9. Pat yourself dry with a clean towel.  10. Wear clean pajamas to bed the night before surgery.  12. Place clean sheets on your bed the night of your first shower and do not sleep with pets.  13. Shower again with the CHG soap on the day  of surgery prior to arriving at the hospital.  14. Do not apply any deodorants/lotions/powders.  15. Please wear clean clothes to the hospital.

## 2023-07-28 ENCOUNTER — Other Ambulatory Visit: Payer: Self-pay | Admitting: Family Medicine

## 2023-07-28 DIAGNOSIS — E119 Type 2 diabetes mellitus without complications: Secondary | ICD-10-CM

## 2023-07-28 DIAGNOSIS — E785 Hyperlipidemia, unspecified: Secondary | ICD-10-CM

## 2023-08-02 ENCOUNTER — Encounter: Payer: Self-pay | Admitting: Vascular Surgery

## 2023-08-02 NOTE — Progress Notes (Signed)
Perioperative / Anesthesia Services  Pre-Admission Testing Clinical Review / Pre-Operative Anesthesia Consult  Date: 08/02/23  Patient Demographics:  Name: Kevin Huerta DOB:   22-Apr-1966 MRN:   [id number]  Planned Surgical Procedure(s):    Case: [id number] Date/Time: 08/04/23 0715   Procedures:      ENDARTERECTOMY FEMORAL (SFA STENT PLACEMENT) (Left)     APPLICATION OF CELL SAVER (Left)   Anesthesia type: General   Pre-op diagnosis: ASO WITH RESTPAIN   Location: [facility] OR ROOM 08 / [facility]   Surgeons: Annice Needy, MD     NOTE: Available PAT nursing documentation and vital signs have been reviewed. Clinical nursing staff has updated patient's PMH/PSHx, current medication list, and drug allergies/intolerances to ensure comprehensive history available to assist in medical decision making as it pertains to the aforementioned surgical procedure and anticipated anesthetic course. Extensive review of available clinical information personally performed. [place] [facility] and PSHx updated with any diagnoses/procedures that  may have been inadvertently omitted during his intake with the pre-admission testing department's nursing staff.  Clinical Discussion:  Kevin Huerta is a 57 y.o. male who is submitted for pre-surgical anesthesia review and clearance prior to him undergoing the above procedure. Patient is a Former Smoker (48 pack years; quit 06/2018). Pertinent PMH includes: CAD, PAD, aortic atherosclerosis, cardiac murmur, angina, HTN, HLD, T2DM, OSAH (not currently using nocturnal PAP therapy), GERD (on daily PPI), emphysema, asthma, OA, insomnia, anxiety (on BZO), episodic marijuana use, occasional ETOH consumption.  Patient is followed by cardiology [name], MD). He was last seen in the cardiology clinic on 04/30/2023; notes reviewed. At the time of his clinic visit, patient doing well overall from a cardiovascular perspective. Patient complained of mild exertional  dyspnea, however reports that symptom is stable and at baseline. Patient denied any chest pain, PND, orthopnea, palpitations, significant peripheral edema, weakness, fatigue, vertiginous symptoms, or presyncope/syncope. Patient with a past medical history significant for cardiovascular diagnoses. Documented physical exam was grossly benign, providing no evidence of acute exacerbation and/or decompensation of the patient's known cardiovascular conditions.  Patient underwent diagnostic LEFT heart catheterization on 11/27/2008 revealing normal coronary anatomy with no evidence of obstructive CAD.  Diet and lifestyle modification in addition to medical therapy was recommended.   Patient underwent an exercise stress test on 07/16/2014 where he exercised for 3 minutes to achieve a maximum heart rate of 173 bpm, which was 100% of the MPHR.  Patient was able to achieve a workload of 7.1 METS.  Exercise did not induce chest pain.  Resting ECG had some nonspecific mild T wave inversions in the inferior leads that did not significantly change with exercise.   Patient underwent a stress echocardiogram on 04/13/2019 revealing a normal left ventricular systolic function with an EF of >55%.  Right ventricular size and function normal.  There was trivial mitral, tricuspid, and pulmonary valve regurgitation.  All transvalvular gradients remain normal with no evidence suggestive of valvular stenosis.  Study determined to be normal and low risk.   Patient underwent PTA of the RIGHT proximal peroneal, proximal SFA, RIGHT popliteal, and distal SFA on 11/16/2022.  6 mm x 15 cm Viabahn stent was placed to the RIGHT distal SFA and above-the-knee popliteal arteries.   Patient underwent a second PTA procedure on 03/22/2023.  Surgeon noted that the SFA was disease, however had flow for about the first 8-10 cm prior to total occlusion.  Occlusion reported to be long down to Hunter's canal where there was reconstitution.  MD attempted  to navigate down into the SFA, however attempts to cross the lesion were unsuccessful.  Hyper procedure with a femoral endarterectomy and potential endoluminal intervention for the SFA occlusion recommended.  Coronary CTA was performed on 03/29/2023 that demonstrated an Agatston coronary artery calcium score of 476. This placed patient in the 95 percentile for age, sex, and race matched controls. Calcium depositions noted to be mild (25-49%) in the proximal LAD and minimal (<25%) in the proximal RCA. Normal coronary origin with RIGHT sided dominance observed.   Due to his PAD, patient is on daily standard dose clopidogrel.  He is reportedly compliant with therapy with no evidence or reports of GI bleeding.  Previously patient was on DAPT therapy, however he had to stop his daily low-dose ASA due to associated tinnitus. Blood pressure reasonably controlled at 130/88 mmHg on currently prescribed ARB (valsartan) monotherapy. Patient is on atorvastatin for his HLD diagnosis and ASCVD prevention. T2DM loosely controlled on currently prescribed regimen; last HgbA1c was 8.4% when checked on 03/23/2023. In the setting of known cardiovascular diagnoses and concurrent T2DM, patient is on an SGLT2i (empagliflozin) for added cardiovascular and renovascular protection. Patient does have an OSAH diagnosis, however he is not currently utilizing nocturnal PAP therapy.  Patient recently underwent repeat PSG testing that demonstrated an AHI of 8.2 with nocturnal desaturations as low at 79%. He is pending follow up with pulmonology to discuss treatment of mild OSAH. Functional capacity somewhat limited by patient's overall respiratory status and presumed deconditioning.  With that said, patient able to complete all of his ADL/IADLs independently without cardiovascular limitation.  Per the DASI, patient is able to complete >4 METS of physical activity without experiencing any significant degree of angina/anginal equivalent  symptoms.  No changes were made to his medication regimen.  Patient to follow-up with outpatient cardiology in 6 months or sooner if needed.   Kevin Huerta is scheduled for a LEFT FEMORAL ENDARTERECTOMY (SFA STENT PLACEMENT) on 08/04/2023 with Dr. Festus Barren, MD.  Given patient's past medical history significant for cardiovascular diagnoses, presurgical cardiac clearance was sought by the PAT team. Per cardiology, "this patient is optimized for surgery and may proceed with the planned procedural course with a LOW risk of significant perioperative cardiovascular complications".  Again, this patient is on daily oral antithrombotic therapy.  He has been instructed on recommendations for holding his clopidogrel for 7 days prior to his procedure with plans to restart as soon as postoperative bleeding risk felt to be minimized by his attending surgeon. The patient has been instructed that his last dose of his clopidogrel should be on 07/27/2023.  Patient denies previous perioperative complications with anesthesia in the past.  In review his EMR, there are no records available for review pertaining to past procedural/anesthetic courses within the [facility] system.     07/27/2023   10:22 AM 06/28/2023   10:42 AM 04/11/2023    1:19 PM  Vitals with BMI  Height 5' 9.5"    Weight 250 lbs 243 lbs 10 oz   BMI 36.4    Systolic 125 126 308  Diastolic 75 86 84  Pulse 73 76 75    Providers/Specialists:   NOTE: Primary physician provider listed below. Patient may have been seen by APP or partner within same practice.   PROVIDER ROLE / SPECIALTY LAST Shanna Cisco, MD Vascular Surgery (Surgeon) 06/28/2023  Dana Allan, MD Primary Care Provider 11/23/2022  Rudean Hitt, MD Cardiology 04/30/2023  Coralyn Helling, MD  Pulmonary Medicine 02/04/2023  Gerrie Nordmann, MD Rheumatology 07/30/2023  Verdis Frederickson, MD Endocrinology 03/23/2023  Merri Ray, MD Physiatry 11/13/2022   Allergies:   Aspirin  Current Home Medications:   No current facility-administered medications for this encounter.    albuterol (VENTOLIN HFA) 108 (90 Base) MCG/ACT inhaler   allopurinol (ZYLOPRIM) 100 MG tablet   ALPRAZolam (XANAX) 0.5 MG tablet   atorvastatin (LIPITOR) 80 MG tablet   Blood Glucose Monitoring Suppl (ONETOUCH VERIO FLEX SYSTEM) w/Device KIT   cetirizine (ZYRTEC) 10 MG tablet   clopidogrel (PLAVIX) 75 MG tablet   clotrimazole-betamethasone (LOTRISONE) cream   colchicine 0.6 MG tablet   Continuous Blood Gluc Sensor (FREESTYLE LIBRE 2 SENSOR) MISC   fluticasone (FLONASE) 50 MCG/ACT nasal spray   Fluticasone Furoate (ARNUITY ELLIPTA) 100 MCG/ACT AEPB   glipiZIDE (GLUCOTROL XL) 10 MG 24 hr tablet   HYDROcodone-acetaminophen (NORCO/VICODIN) 5-325 MG tablet   ipratropium (ATROVENT) 0.06 % nasal spray   ketoconazole (NIZORAL) 2 % cream   Melatonin 10 MG CAPS   Multiple Vitamin (MULTI-VITAMIN) tablet   omeprazole (PRILOSEC) 40 MG capsule   OneTouch Delica Lancets 33G MISC   ONETOUCH VERIO test strip   OVER THE COUNTER MEDICATION   pioglitazone (ACTOS) 15 MG tablet   Probiotic Product (PROBIOTIC DAILY PO)   Semaglutide, 2 MG/DOSE, (OZEMPIC, 2 MG/DOSE,) 8 MG/3ML SOPN   SYNJARDY XR 12.02-999 MG TB24   traZODone (DESYREL) 100 MG tablet   valsartan (DIOVAN) 320 MG tablet   History:   Past Medical History:  Diagnosis Date   Allergic rhinitis, seasonal    Anginal equivalent (HCC)    Anxiety    a.) on BZO (alprazolam) PRN   Aortic atherosclerosis (HCC)    Arthritis    Asthma    Atherosclerosis of native artery of both lower extremities with intermittent claudication (HCC)    a.) s/p PTA of RIGHT prox peroneal, prox SFA, popliteal, and distal SFA; 6 mm x 15 cm Viabahn stent to RIGHT distal SFA and above knee popliteal artery; b.) s/p unsuccessful PTA of occluded SFA 03/22/2023 --> plans for endarterectomy +/-  endoluminal intervention   BRBPR (bright red blood per rectum)  05/24/2020   Chest heaviness 10/27/2022   Coronary artery disease of native artery of native heart with stable angina pectoris (HCC) 03/29/2023   a.) cCTA 03/29/2023: Ca2+ 476 (95th percentile for age/sex/race matched control)   Current vaping on some days    DDD (degenerative disc disease), cervical    a.) s/p ACDF C3-C5 2014   Dermatitis 06/02/2021   Diarrhea 02/26/2021   Emphysema lung (HCC)    Fatty liver 09/03/2020   GERD (gastroesophageal reflux disease)    Gout    Heart murmur    History of cardiac catheterization 11/27/2008   a.) LHC 11/27/2008 at [facility] --> normal coronary anatomy with no obstructive CAD   Hyperlipidemia    Hypersomnia 07/17/2014   Hypertension    Insomnia    a.) uses melatonin +/- trazodone PRN   Leg cramps 02/26/2021   Marijuana use, episodic    Occasional alcohol consumption    On long term clopidogrel therapy    OSA (obstructive sleep apnea)    a.) not currently on nocturnal PAP therapy; needs repeat PSG   Seasonal allergies    T2DM (type 2 diabetes mellitus) (HCC)    a.) monitors with Freestyle Libre CGM   Tinnitus    Past Surgical History:  Procedure Laterality Date   ANTERIOR CERVICAL DECOMP/DISCECTOMY FUSION N/A  2014   KNEE SURGERY Right    meniscus   LEFT HEART CATH AND CORONARY ANGIOGRAPHY Left 11/27/2008   Procedure: LEFT HEART CATH AND CORONARY ANGIOGRAPHY; [facility]; Surgeon: Shari Prows, MD   LOWER EXTREMITY ANGIOGRAPHY Right 11/16/2022   Procedure: Lower Extremity Angiography;  Surgeon: Annice Needy, MD;  Location: [facility] INVASIVE CV LAB;  Service: Cardiovascular;  Laterality: Right;   LOWER EXTREMITY ANGIOGRAPHY Left 03/22/2023   Procedure: Lower Extremity Angiography;  Surgeon: Annice Needy, MD;  Location: [facility] INVASIVE CV LAB;  Service: Cardiovascular;  Laterality: Left;   ROTATOR CUFF REPAIR Right    WISDOM TOOTH EXTRACTION     Family History  Problem Relation Age of Onset    Heart attack Father 95   Alcohol abuse Father    Drug abuse Father    Heart disease Father    Lung cancer Father    Hypertension Mother    Heart attack Maternal Grandmother 27   Stroke Maternal Aunt 80   Social History   Tobacco Use   Smoking status: Former    Current packs/day: 0.00    Average packs/day: 1.5 packs/day for 32.0 years (48.0 ttl pk-yrs)    Types: Cigarettes, E-cigarettes    Start date: 06/11/1986    Quit date: 06/11/2018    Years since quitting: 5.1   Smokeless tobacco: Former    Types: Snuff    Quit date: 1996   Tobacco comments:    I vape weekly."  Vaping Use   Vaping status: Some Days   Substances: Nicotine   Devices: x3 per day to quit smoking nicotine   Substance Use Topics   Alcohol use: Yes    Comment: cut back, 6 pack per month   Drug use: Not Currently    Types: Marijuana    Comment: a few times a year    Pertinent Clinical Results:  LABS:   No visits with results within 3 Day(s) from this visit.  Latest known visit with results is:  Hospital Outpatient Visit on 07/27/2023  Component Date Value Ref Range Status   MRSA, PCR 07/27/2023 NEGATIVE  NEGATIVE Final   Staphylococcus aureus 07/27/2023 NEGATIVE  NEGATIVE Final   Comment: (NOTE) The Xpert SA Assay (FDA approved for NASAL specimens in patients 40 years of age and older), is one component of a comprehensive surveillance program. It is not intended to diagnose infection nor to guide or monitor treatment. Performed at [facility], [address] [place], [place] [address]    WBC 07/27/2023 4.2  4.0 - 10.5 K/uL Final   RBC 07/27/2023 5.28  4.22 - 5.81 MIL/uL Final   Hemoglobin 07/27/2023 14.1  13.0 - 17.0 g/dL Final   HCT 78/29/5621 44.9  39.0 - 52.0 % Final   MCV 07/27/2023 85.0  80.0 - 100.0 fL Final   MCH 07/27/2023 26.7  26.0 - 34.0 pg Final   MCHC 07/27/2023 31.4  30.0 - 36.0 g/dL Final   RDW 30/86/5784 14.8  11.5 - 15.5 % Final   Platelets 07/27/2023 257  150 - 400  K/uL Final   nRBC 07/27/2023 0.0  0.0 - 0.2 % Final   Performed at [facility], [address] [place], [place] [address]   Sodium 07/27/2023 140  135 - 145 mmol/L Final   Potassium 07/27/2023 4.9  3.5 - 5.1 mmol/L Final   Chloride 07/27/2023 103  98 - 111 mmol/L Final   CO2 07/27/2023 27  22 - 32  mmol/L Final   Glucose, Bld 07/27/2023 249 (H)  70 - 99 mg/dL Final   Glucose reference range applies only to samples taken after fasting for at least 8 hours.   BUN 07/27/2023 19  6 - 20 mg/dL Final   Creatinine, Ser 07/27/2023 1.15  0.61 - 1.24 mg/dL Final   Calcium 16/07/9603 9.8  8.9 - 10.3 mg/dL Final   Total Protein 54/06/8118 8.1  6.5 - 8.1 g/dL Final   Albumin 14/78/2956 4.6  3.5 - 5.0 g/dL Final   AST 21/30/8657 21  15 - 41 U/L Final   ALT 07/27/2023 29  0 - 44 U/L Final   Alkaline Phosphatase 07/27/2023 67  38 - 126 U/L Final   Total Bilirubin 07/27/2023 0.6  0.3 - 1.2 mg/dL Final   GFR, Estimated 07/27/2023 >60  >60 mL/min Final   Comment: (NOTE) Calculated using the CKD-EPI Creatinine Equation (2021)    Anion gap 07/27/2023 10  5 - 15 Final   Performed at [facility], [address] [place], [place] [address]    ECG: Date: 04/06/2023 Time ECG obtained: 1454 PM Rate: 78 bpm Rhythm: normal sinus Axis (leads I and aVF): Normal Intervals: PR 156 ms. QRS 86 ms. QTc 419 ms. ST segment and T wave changes: Nonspecific T wave abnormalities in the lateral leads  Comparison: Similar to previous tracing obtained on 10/27/2022   IMAGING / PROCEDURES: CT CORONARY MORPH W/CTA COR W/SCORE W/CA W/CM &/OR WO/CM performed on 03/29/2023 Scattered aortic atherosclerosis Coronary calcium score of 476. This was 95th percentile for age and sex matched control. Normal coronary origin with left dominance. Mild proximal LAD stenosis (25-49%). Minimal proximal RCA stenosis (<25%). CAD-RADS 2. Mild non-obstructive CAD (25-49%). Consider preventive therapy and risk factor  modification.   VAS [place] LOWER EXTREMITY ARTERIAL DUPLEX performed on 03/15/2023 Imaging and Waveforms obtained throughout in the Right Lower Extremity. Biphasic Waveforms seen in the Tibial Arteries and Peroneal Artery.  Imaging and Waveforms obtained throughout in the Left Lower Extremity. The Left SFA appears to be occluded in the Proximal/Mid segment; collateral flow restored in the Distal SFA. Monophasic Waveforms seen in the Tibial Arteries and Peroneal Artery.    VAS [place] ABI WITH/WO TBI performed on 03/15/2023 Resting right ankle-brachial index is within normal range. The right toe-brachial index is normal.  Resting left ankle-brachial index indicates mild left lower extremity arterial disease. The left toe-brachial index is normal.    PULMONARY FUNCTION TESTING performed on 02/18/2023     Latest Ref Rng & Units 02/18/2023    4:46 PM  PFT Results  FVC-Pre L 3.95   FVC-Predicted Pre % 83   FVC-Post L 4.12   FVC-Predicted Post % 87   Pre FEV1/FVC % % 84   Post FEV1/FCV % % 82   FEV1-Pre L 3.31   FEV1-Predicted Pre % 91   FEV1-Post L 3.38   DLCO uncorrected ml/min/mmHg 34.75   DLCO UNC% % 126   DLVA Predicted % 123   TLC L 6.97   TLC % Predicted % 102   RV % Predicted % 129     MR LUMBAR SPINE WO CONTRAST performed on 03/13/2022 At L5-S1 there is a broad-based disc osteophyte complex. Moderate bilateral facet arthropathy. Prominence of the epidural fat deforming the thecal sac as can be seen with epidural lipomatosis. No spinal stenosis. Severe bilateral foraminal stenosis. At L4-5 there is a mild broad-based disc bulge flattening the ventral thecal sac. Moderate bilateral facet arthropathy. Mild spinal  stenosis. Mild right foraminal stenosis. Mild spondylosis throughout the remainder of the lumbar spine as described above.   CT CHEST LUNG CA SCREEN LOW DOSE W/O CM performed on 02/02/2022 Lung-RADS 1, negative. Continue annual screening with low-dose chest CT without contrast in  12 months. Hepatic steatosis. Aortic atherosclerosis  Emphysema  Coronary artery atherosclerosis.  Impression and Plan:  Kevin Huerta has been referred for pre-anesthesia review and clearance prior to him undergoing the planned anesthetic and procedural courses. Available labs, pertinent testing, and imaging results were personally reviewed by me in preparation for upcoming operative/procedural course. [facility] medical record has been updated following extensive record review and patient interview with PAT staff.   This patient has been appropriately cleared by cardiology with an overall LOW risk of experiencing significant perioperative cardiovascular complications. Based on clinical review performed today (08/02/23), barring any significant acute changes in the patient's overall condition, it is anticipated that he will be able to proceed with the planned surgical intervention. Any acute changes in clinical condition may necessitate his procedure being postponed and/or cancelled. Patient will meet with anesthesia team (MD and/or CRNA) on the day of his procedure for preoperative evaluation/assessment. Questions regarding anesthetic course will be fielded at that time.   Pre-surgical instructions were reviewed with the patient during his PAT appointment, and questions were fielded to satisfaction by PAT clinical staff. He has been instructed on which medications that he will need to hold prior to surgery, as well as the ones that have been deemed safe/appropriate to take on the day of his procedure. As part of the general education provided by PAT, patient made aware both verbally and in writing, that he would need to abstain from the use of any illegal substances during his perioperative course.  He was advised that failure to follow the provided instructions could necessitate case cancellation or result in serious perioperative complications up to and including death. Patient encouraged to contact  PAT and/or his surgeon's office to discuss any questions or concerns that may arise prior to surgery; verbalized understanding.   Quentin Mulling, MSN, APRN, FNP-C, CEN [facility]  Perioperative Services Nurse Practitioner Phone: [phone] Fax: [phone] 08/02/23 8:48 AM  NOTE: This note has been prepared using Dragon dictation software. Despite my best ability to proofread, there is always the potential that unintentional transcriptional errors may still occur from this process.

## 2023-08-03 NOTE — Progress Notes (Signed)
  Perioperative Services Pre-Admission/Anesthesia Testing    Date: 08/03/23  Name: Kevin Huerta MRN:   [id number]  Re: GLP-1 clearance and provider recommendations   Planned Surgical Procedure(s):    Case: [id number] Date/Time: 08/04/23 0715   Procedures:      ENDARTERECTOMY FEMORAL (SFA STENT PLACEMENT) (Left)     APPLICATION OF CELL SAVER (Left)   Anesthesia type: General   Pre-op diagnosis: ASO WITH RESTPAIN   Location: [facility] OR ROOM 08 / [facility]S FOR ANESTHESIA [facility]   Surgeons: Annice Needy, MD      Clinical Notes:  Patient is scheduled for the above procedure with the indicated provider/surgeon. In review of his medication reconciliation it was noted that patient is on a prescribed GLP-1 medication. Per guidelines issued by the American Society of Anesthesiologists (ASA), it is recommended that these medications be held for 7 days prior to the patient undergoing any type of elective surgical procedure. The patient is taking the following GLP-1 medication:  [x]  SEMAGLUTIDE   []  EXENATIDE  []  LIRAGLUTIDE   []  LIXISENATIDE  []  DULAGLUTIDE     []  TIRZEPATIDE (GLP-1/GIP)  Reached out to prescribing provider Gershon Crane, MD) to make them aware of the guidelines from anesthesia. Given that this patient takes the prescribed GLP-1 medication for his  diabetes diagnosis, rather than for weight loss, recommendations from the prescribing provider were solicited. Prescribing provider made aware of the following so that informed decision/POC can be developed for this patient that may be taking medications belonging to these drug classes:  Oral GLP-1 medications will be held 1 day prior to surgery.  Injectable GLP-1 medications will be held 7 days prior to surgery.  Metformin is routinely held 48 hours prior to surgery due to renal concerns, potential need for contrasted imaging perioperatively, and the potential for tissue hypoxia leading to drug induced lactic acidosis.  All SGLT2i  medications are held 72 hours prior to surgery as they can be associated with the increased potential for developing euglycemic diabetic ketoacidosis (EDKA).   Impression and Plan:  [name] is on a prescribed GLP-1 medication, which induces the known side effect of decreased gastric emptying. Efforts are bring made to mitigate the risk of perioperative hyperglycemic events, as elevated blood glucose levels have been found to contribute to intra/postoperative complications. Additionally, hyperglycemic extremes can potentially necessitate the postponing of a patient's elective case in order to better optimize perioperative glycemic control, again with the aforementioned guidelines in place. With this in mind, recommendations have been sought from the prescribing provider, who has cleared patient to proceed with holding the prescribed GLP-1 as per the guidelines from the ASA.   Provider recommending: no further recommendations received from the prescribing provider.  Copy of signed clearance and recommendations placed on patient's chart for inclusion in their medical record and for review by the surgical/anesthetic team on the day of his procedure.   Quentin Mulling, MSN, APRN, FNP-C, CEN [facility]  Perioperative Services Nurse Practitioner Phone: [phone] Fax: [phone] 08/03/23 10:57 AM  NOTE: This note has been prepared using Dragon dictation software. Despite my best ability to proofread, there is always the potential that unintentional transcriptional errors may still occur from this process.

## 2023-08-04 ENCOUNTER — Ambulatory Visit: Payer: 59

## 2023-08-04 ENCOUNTER — Encounter
Admission: RE | Disposition: A | Discharge status: Home / Self Care | Payer: Self-pay | Source: Home / Self Care | Attending: Vascular Surgery

## 2023-08-04 ENCOUNTER — Encounter: Payer: Self-pay | Admitting: Vascular Surgery

## 2023-08-04 ENCOUNTER — Ambulatory Visit: Payer: 59 | Admitting: Urgent Care

## 2023-08-04 ENCOUNTER — Inpatient Hospital Stay
Admission: RE | Admit: 2023-08-04 | Discharge: 2023-08-06 | DRG: 254 | Disposition: A | Discharge status: Home / Self Care | Payer: 59 | Attending: Vascular Surgery | Admitting: Vascular Surgery

## 2023-08-04 ENCOUNTER — Other Ambulatory Visit: Payer: Self-pay

## 2023-08-04 DIAGNOSIS — F17290 Nicotine dependence, other tobacco product, uncomplicated: Secondary | ICD-10-CM | POA: Diagnosis present

## 2023-08-04 DIAGNOSIS — I2510 Atherosclerotic heart disease of native coronary artery without angina pectoris: Secondary | ICD-10-CM | POA: Diagnosis present

## 2023-08-04 DIAGNOSIS — R011 Cardiac murmur, unspecified: Secondary | ICD-10-CM | POA: Diagnosis present

## 2023-08-04 DIAGNOSIS — Z7985 Long-term (current) use of injectable non-insulin antidiabetic drugs: Secondary | ICD-10-CM | POA: Diagnosis not present

## 2023-08-04 DIAGNOSIS — E119 Type 2 diabetes mellitus without complications: Secondary | ICD-10-CM

## 2023-08-04 DIAGNOSIS — I70222 Atherosclerosis of native arteries of extremities with rest pain, left leg: Secondary | ICD-10-CM | POA: Diagnosis present

## 2023-08-04 DIAGNOSIS — K760 Fatty (change of) liver, not elsewhere classified: Secondary | ICD-10-CM | POA: Diagnosis present

## 2023-08-04 DIAGNOSIS — I70229 Atherosclerosis of native arteries of extremities with rest pain, unspecified extremity: Secondary | ICD-10-CM | POA: Diagnosis present

## 2023-08-04 DIAGNOSIS — E1151 Type 2 diabetes mellitus with diabetic peripheral angiopathy without gangrene: Principal | ICD-10-CM | POA: Diagnosis present

## 2023-08-04 DIAGNOSIS — G4710 Hypersomnia, unspecified: Secondary | ICD-10-CM | POA: Diagnosis present

## 2023-08-04 DIAGNOSIS — E1165 Type 2 diabetes mellitus with hyperglycemia: Secondary | ICD-10-CM | POA: Diagnosis present

## 2023-08-04 DIAGNOSIS — J439 Emphysema, unspecified: Secondary | ICD-10-CM | POA: Diagnosis present

## 2023-08-04 DIAGNOSIS — G4733 Obstructive sleep apnea (adult) (pediatric): Secondary | ICD-10-CM | POA: Diagnosis present

## 2023-08-04 DIAGNOSIS — Z8249 Family history of ischemic heart disease and other diseases of the circulatory system: Secondary | ICD-10-CM

## 2023-08-04 DIAGNOSIS — Z7951 Long term (current) use of inhaled steroids: Secondary | ICD-10-CM | POA: Diagnosis not present

## 2023-08-04 DIAGNOSIS — F419 Anxiety disorder, unspecified: Secondary | ICD-10-CM | POA: Diagnosis present

## 2023-08-04 DIAGNOSIS — M109 Gout, unspecified: Secondary | ICD-10-CM | POA: Diagnosis present

## 2023-08-04 DIAGNOSIS — Z79899 Other long term (current) drug therapy: Secondary | ICD-10-CM | POA: Diagnosis not present

## 2023-08-04 DIAGNOSIS — Z7984 Long term (current) use of oral hypoglycemic drugs: Secondary | ICD-10-CM | POA: Diagnosis not present

## 2023-08-04 DIAGNOSIS — E785 Hyperlipidemia, unspecified: Secondary | ICD-10-CM | POA: Diagnosis present

## 2023-08-04 DIAGNOSIS — Z8719 Personal history of other diseases of the digestive system: Secondary | ICD-10-CM

## 2023-08-04 DIAGNOSIS — Z823 Family history of stroke: Secondary | ICD-10-CM

## 2023-08-04 DIAGNOSIS — Z811 Family history of alcohol abuse and dependence: Secondary | ICD-10-CM

## 2023-08-04 DIAGNOSIS — I700 Atherosclerosis of aorta: Secondary | ICD-10-CM | POA: Diagnosis present

## 2023-08-04 DIAGNOSIS — E1169 Type 2 diabetes mellitus with other specified complication: Secondary | ICD-10-CM | POA: Diagnosis present

## 2023-08-04 DIAGNOSIS — J45909 Unspecified asthma, uncomplicated: Secondary | ICD-10-CM | POA: Diagnosis present

## 2023-08-04 DIAGNOSIS — Z801 Family history of malignant neoplasm of trachea, bronchus and lung: Secondary | ICD-10-CM

## 2023-08-04 DIAGNOSIS — Z813 Family history of other psychoactive substance abuse and dependence: Secondary | ICD-10-CM

## 2023-08-04 DIAGNOSIS — I10 Essential (primary) hypertension: Secondary | ICD-10-CM | POA: Diagnosis present

## 2023-08-04 DIAGNOSIS — Z95820 Peripheral vascular angioplasty status with implants and grafts: Secondary | ICD-10-CM

## 2023-08-04 DIAGNOSIS — G4700 Insomnia, unspecified: Secondary | ICD-10-CM | POA: Diagnosis present

## 2023-08-04 DIAGNOSIS — Z9889 Other specified postprocedural states: Secondary | ICD-10-CM

## 2023-08-04 DIAGNOSIS — Z981 Arthrodesis status: Secondary | ICD-10-CM

## 2023-08-04 DIAGNOSIS — Z7902 Long term (current) use of antithrombotics/antiplatelets: Secondary | ICD-10-CM

## 2023-08-04 DIAGNOSIS — K219 Gastro-esophageal reflux disease without esophagitis: Secondary | ICD-10-CM | POA: Diagnosis present

## 2023-08-04 DIAGNOSIS — Z886 Allergy status to analgesic agent status: Secondary | ICD-10-CM

## 2023-08-04 DIAGNOSIS — Z01818 Encounter for other preprocedural examination: Secondary | ICD-10-CM

## 2023-08-04 HISTORY — DX: Other seasonal allergic rhinitis: J30.2

## 2023-08-04 HISTORY — PX: ENDARTERECTOMY FEMORAL: SHX5804

## 2023-08-04 HISTORY — DX: Long term (current) use of anticoagulants: Z79.01

## 2023-08-04 LAB — GLUCOSE, CAPILLARY
Glucose-Capillary: 234 mg/dL — ABNORMAL HIGH (ref 70–99)
Glucose-Capillary: 302 mg/dL — ABNORMAL HIGH (ref 70–99)
Glucose-Capillary: 197 mg/dL — ABNORMAL HIGH (ref 70–99)
Glucose-Capillary: 250 mg/dL — ABNORMAL HIGH (ref 70–99)

## 2023-08-04 LAB — TYPE AND SCREEN
ABO/RH(D): A POS
Antibody Screen: NEGATIVE

## 2023-08-04 LAB — CBC
HCT: 37.6 % — ABNORMAL LOW (ref 39.0–52.0)
Hemoglobin: 12.5 g/dL — ABNORMAL LOW (ref 13.0–17.0)
MCH: 26.8 pg (ref 26.0–34.0)
MCHC: 33.2 g/dL (ref 30.0–36.0)
MCV: 80.7 fL (ref 80.0–100.0)
Platelets: 218 10*3/uL (ref 150–400)
RBC: 4.66 MIL/uL (ref 4.22–5.81)
RDW: 14.6 % (ref 11.5–15.5)
WBC: 7.5 10*3/uL (ref 4.0–10.5)
nRBC: 0 % (ref 0.0–0.2)

## 2023-08-04 LAB — HEMOGLOBIN A1C
Hgb A1c MFr Bld: 9.1 % — ABNORMAL HIGH (ref 4.8–5.6)
Mean Plasma Glucose: 214.47 mg/dL

## 2023-08-04 LAB — CREATININE, SERUM
Creatinine, Ser: 0.83 mg/dL (ref 0.61–1.24)
GFR, Estimated: >60 mL/min (ref >60)

## 2023-08-04 SURGERY — ENDARTERECTOMY, FEMORAL
Anesthesia: General | Site: Groin | Laterality: Left

## 2023-08-04 MED ORDER — INSULIN ASPART 100 UNIT/ML IJ SOLN
5.0000 [IU] | Freq: Once | INTRAMUSCULAR | Status: AC
  Administered 2023-08-04: 5 [IU] via SUBCUTANEOUS

## 2023-08-04 MED ORDER — VANCOMYCIN HCL 1000 MG IV SOLR
INTRAVENOUS | Status: DC | PRN
Start: 2023-08-04 — End: 2023-08-04
  Administered 2023-08-04: 1000 mg

## 2023-08-04 MED ORDER — POTASSIUM CHLORIDE CRYS ER 20 MEQ PO TBCR: 20.0000 meq | EXTENDED_RELEASE_TABLET | Freq: Every day | ORAL | Status: DC | PRN

## 2023-08-04 MED ORDER — ACETAMINOPHEN 325 MG PO TABS
325.0000 mg | ORAL_TABLET | ORAL | Status: DC | PRN
  Administered 2023-08-06: 650 mg via ORAL
  Filled 2023-08-04: qty 2

## 2023-08-04 MED ORDER — LIDOCAINE HCL (CARDIAC) PF 100 MG/5ML IV SOSY
PREFILLED_SYRINGE | INTRAVENOUS | Status: DC | PRN
  Administered 2023-08-04: 100 mg via INTRAVENOUS

## 2023-08-04 MED ORDER — TRAZODONE HCL 50 MG PO TABS
100.0000 mg | ORAL_TABLET | Freq: Every day | ORAL | Status: DC
  Administered 2023-08-04 – 2023-08-05 (×2): 100 mg via ORAL
  Filled 2023-08-04: qty 2
  Filled 2023-08-04: qty 1

## 2023-08-04 MED ORDER — EMPAGLIFLOZIN-METFORMIN HCL ER 12.5-1000 MG PO TB24: 2.0000 | ORAL_TABLET | Freq: Every day | ORAL | Status: DC

## 2023-08-04 MED ORDER — HYDROMORPHONE HCL 1 MG/ML IJ SOLN: 1.0000 mg | Freq: Once | INTRAMUSCULAR | Status: DC | PRN

## 2023-08-04 MED ORDER — FENTANYL CITRATE (PF) 100 MCG/2ML IJ SOLN
INTRAMUSCULAR | Status: AC
  Filled 2023-08-04: qty 2

## 2023-08-04 MED ORDER — ARTIFICIAL TEARS OPHTHALMIC OINT
TOPICAL_OINTMENT | OPHTHALMIC | Status: AC
  Filled 2023-08-04: qty 3.5

## 2023-08-04 MED ORDER — ALPRAZOLAM 0.5 MG PO TABS
0.5000 mg | ORAL_TABLET | Freq: Two times a day (BID) | ORAL | Status: DC | PRN
  Administered 2023-08-04 – 2023-08-05 (×2): 0.5 mg via ORAL
  Filled 2023-08-04 (×2): qty 1

## 2023-08-04 MED ORDER — HEPARIN 30,000 UNITS/1000 ML (OHS) CELLSAVER SOLUTION
Status: AC
  Filled 2023-08-04: qty 1000

## 2023-08-04 MED ORDER — HEPARIN SODIUM (PORCINE) 5000 UNIT/ML IJ SOLN
INTRAMUSCULAR | Status: AC
Start: 2023-08-04 — End: ?
  Filled 2023-08-04: qty 1

## 2023-08-04 MED ORDER — INSULIN ASPART 100 UNIT/ML IJ SOLN
INTRAMUSCULAR | Status: AC
Start: 2023-08-04 — End: ?
  Filled 2023-08-04: qty 1

## 2023-08-04 MED ORDER — PROTAMINE SULFATE 10 MG/ML IV SOLN
INTRAVENOUS | Status: AC
  Filled 2023-08-04: qty 25

## 2023-08-04 MED ORDER — METOPROLOL TARTRATE 5 MG/5ML IV SOLN: 2.0000 mg | INTRAVENOUS | Status: DC | PRN

## 2023-08-04 MED ORDER — HYDROMORPHONE HCL 1 MG/ML IJ SOLN
INTRAMUSCULAR | Status: AC
  Filled 2023-08-04: qty 1

## 2023-08-04 MED ORDER — ALBUMIN HUMAN 5 % IV SOLN: INTRAVENOUS | Status: DC | PRN

## 2023-08-04 MED ORDER — PHENOL 1.4 % MT LIQD: 1.0000 | OROMUCOSAL | Status: DC | PRN

## 2023-08-04 MED ORDER — IODIXANOL 320 MG/ML IV SOLN
INTRAVENOUS | Status: DC | PRN
  Administered 2023-08-04: 85 mL

## 2023-08-04 MED ORDER — HEPARIN SODIUM (PORCINE) 1000 UNIT/ML IJ SOLN
INTRAMUSCULAR | Status: DC | PRN
  Administered 2023-08-04: 7000 [IU] via INTRAVENOUS
  Administered 2023-08-04: 2000 [IU] via INTRAVENOUS

## 2023-08-04 MED ORDER — EMPAGLIFLOZIN 25 MG PO TABS
25.0000 mg | ORAL_TABLET | Freq: Every day | ORAL | Status: DC
  Administered 2023-08-05 – 2023-08-06 (×2): 25 mg via ORAL
  Filled 2023-08-04 (×2): qty 1

## 2023-08-04 MED ORDER — OXYCODONE-ACETAMINOPHEN 5-325 MG PO TABS
1.0000 | ORAL_TABLET | ORAL | Status: DC | PRN
Start: 2023-08-04 — End: 2023-08-06
  Administered 2023-08-04 – 2023-08-06 (×8): 2 via ORAL
  Administered 2023-08-06: 1 via ORAL
  Filled 2023-08-04 (×8): qty 2
  Filled 2023-08-04: qty 1
  Filled 2023-08-04: qty 2

## 2023-08-04 MED ORDER — CHLORHEXIDINE GLUCONATE 0.12 % MT SOLN
15.0000 mL | Freq: Once | OROMUCOSAL | Status: AC
  Administered 2023-08-04: 15 mL via OROMUCOSAL

## 2023-08-04 MED ORDER — MIDAZOLAM HCL 2 MG/2ML IJ SOLN
INTRAMUSCULAR | Status: DC | PRN
  Administered 2023-08-04: 2 mg via INTRAVENOUS

## 2023-08-04 MED ORDER — PANTOPRAZOLE SODIUM 40 MG PO TBEC
40.0000 mg | DELAYED_RELEASE_TABLET | Freq: Every day | ORAL | Status: DC
  Administered 2023-08-05 – 2023-08-06 (×2): 40 mg via ORAL
  Filled 2023-08-04 (×2): qty 1

## 2023-08-04 MED ORDER — ACETAMINOPHEN 650 MG RE SUPP: 325.0000 mg | RECTAL | Status: DC | PRN

## 2023-08-04 MED ORDER — ACETAMINOPHEN 10 MG/ML IV SOLN
INTRAVENOUS | Status: AC
Start: 2023-08-04 — End: ?
  Filled 2023-08-04: qty 100

## 2023-08-04 MED ORDER — HYDRALAZINE HCL 20 MG/ML IJ SOLN
5.0000 mg | INTRAMUSCULAR | Status: AC | PRN
  Administered 2023-08-05 (×2): 5 mg via INTRAVENOUS
  Filled 2023-08-04 (×2): qty 1

## 2023-08-04 MED ORDER — CHLORHEXIDINE GLUCONATE CLOTH 2 % EX PADS
6.0000 | MEDICATED_PAD | Freq: Once | CUTANEOUS | Status: AC
  Administered 2023-08-04: 6 via TOPICAL

## 2023-08-04 MED ORDER — DEXMEDETOMIDINE HCL IN NACL 80 MCG/20ML IV SOLN
INTRAVENOUS | Status: AC
Start: 2023-08-04 — End: ?
  Filled 2023-08-04: qty 20

## 2023-08-04 MED ORDER — CEFAZOLIN SODIUM-DEXTROSE 2-4 GM/100ML-% IV SOLN
2.0000 g | Freq: Three times a day (TID) | INTRAVENOUS | Status: AC
Start: 2023-08-04 — End: 2023-08-05
  Administered 2023-08-04 – 2023-08-05 (×2): 2 g via INTRAVENOUS
  Filled 2023-08-04 (×2): qty 100

## 2023-08-04 MED ORDER — PROPOFOL 10 MG/ML IV BOLUS
INTRAVENOUS | Status: DC | PRN
  Administered 2023-08-04 (×2): 50 mg via INTRAVENOUS
  Administered 2023-08-04: 200 mg via INTRAVENOUS

## 2023-08-04 MED ORDER — INSULIN ASPART 100 UNIT/ML IJ SOLN
8.0000 [IU] | Freq: Once | INTRAMUSCULAR | Status: AC
  Administered 2023-08-04: 8 [IU] via SUBCUTANEOUS

## 2023-08-04 MED ORDER — HEPARIN SODIUM (PORCINE) 1000 UNIT/ML IJ SOLN
INTRAMUSCULAR | Status: AC
  Filled 2023-08-04: qty 10

## 2023-08-04 MED ORDER — VISTASEAL 4 ML SINGLE DOSE KIT
PACK | CUTANEOUS | Status: DC | PRN
  Administered 2023-08-04: 4 mL via TOPICAL

## 2023-08-04 MED ORDER — GUAIFENESIN-DM 100-10 MG/5ML PO SYRP: 15.0000 mL | ORAL_SOLUTION | ORAL | Status: DC | PRN

## 2023-08-04 MED ORDER — ALUM & MAG HYDROXIDE-SIMETH 200-200-20 MG/5ML PO SUSP: 15.0000 mL | ORAL | Status: DC | PRN

## 2023-08-04 MED ORDER — LIDOCAINE HCL (PF) 2 % IJ SOLN
INTRAMUSCULAR | Status: AC
  Filled 2023-08-04: qty 5

## 2023-08-04 MED ORDER — VISTASEAL 4 ML SINGLE DOSE KIT
PACK | CUTANEOUS | Status: AC
  Filled 2023-08-04: qty 4

## 2023-08-04 MED ORDER — RISAQUAD PO CAPS
1.0000 | ORAL_CAPSULE | Freq: Every day | ORAL | Status: DC
  Administered 2023-08-05 – 2023-08-06 (×2): 1 via ORAL
  Filled 2023-08-04 (×2): qty 1

## 2023-08-04 MED ORDER — FAMOTIDINE IN NACL 20-0.9 MG/50ML-% IV SOLN
20.0000 mg | Freq: Two times a day (BID) | INTRAVENOUS | Status: DC
  Administered 2023-08-04: 20 mg via INTRAVENOUS
  Filled 2023-08-04: qty 50

## 2023-08-04 MED ORDER — GENTAMICIN SULFATE 40 MG/ML IJ SOLN
INTRAMUSCULAR | Status: AC
  Filled 2023-08-04: qty 2

## 2023-08-04 MED ORDER — MIDAZOLAM HCL 2 MG/2ML IJ SOLN
INTRAMUSCULAR | Status: AC
  Filled 2023-08-04: qty 2

## 2023-08-04 MED ORDER — SODIUM CHLORIDE 0.9 % IV SOLN: INTRAVENOUS | Status: DC

## 2023-08-04 MED ORDER — DEXAMETHASONE SODIUM PHOSPHATE 10 MG/ML IJ SOLN
INTRAMUSCULAR | Status: AC
  Filled 2023-08-04: qty 1

## 2023-08-04 MED ORDER — INSULIN ASPART 100 UNIT/ML IJ SOLN
INTRAMUSCULAR | Status: AC
  Filled 2023-08-04: qty 1

## 2023-08-04 MED ORDER — ORAL CARE MOUTH RINSE: 15.0000 mL | Freq: Once | OROMUCOSAL | Status: AC

## 2023-08-04 MED ORDER — ONDANSETRON HCL 4 MG/2ML IJ SOLN
INTRAMUSCULAR | Status: DC | PRN
  Administered 2023-08-04: 4 mg via INTRAVENOUS

## 2023-08-04 MED ORDER — DOPAMINE-DEXTROSE 3.2-5 MG/ML-% IV SOLN: 3.0000 ug/kg/min | INTRAVENOUS | Status: DC

## 2023-08-04 MED ORDER — METFORMIN HCL ER 750 MG PO TB24
2000.0000 mg | ORAL_TABLET | Freq: Every day | ORAL | Status: DC
  Administered 2023-08-05 – 2023-08-06 (×2): 2000 mg via ORAL
  Filled 2023-08-04 (×2): qty 1

## 2023-08-04 MED ORDER — CLOPIDOGREL BISULFATE 75 MG PO TABS
75.0000 mg | ORAL_TABLET | Freq: Every day | ORAL | Status: DC
  Administered 2023-08-05 – 2023-08-06 (×2): 75 mg via ORAL
  Filled 2023-08-04 (×2): qty 1

## 2023-08-04 MED ORDER — FENTANYL CITRATE (PF) 100 MCG/2ML IJ SOLN
INTRAMUSCULAR | Status: DC | PRN
  Administered 2023-08-04 (×4): 50 ug via INTRAVENOUS

## 2023-08-04 MED ORDER — GENTAMICIN SULFATE 40 MG/ML IJ SOLN
INTRAMUSCULAR | Status: DC | PRN
Start: 2023-08-04 — End: 2023-08-04
  Administered 2023-08-04: 80 mg

## 2023-08-04 MED ORDER — BUDESONIDE 0.25 MG/2ML IN SUSP
0.2500 mg | Freq: Two times a day (BID) | RESPIRATORY_TRACT | Status: DC
  Administered 2023-08-04 – 2023-08-06 (×4): 0.25 mg via RESPIRATORY_TRACT
  Filled 2023-08-04 (×4): qty 2

## 2023-08-04 MED ORDER — FLUTICASONE PROPIONATE 50 MCG/ACT NA SUSP: 2.0000 | Freq: Every day | NASAL | Status: DC | PRN

## 2023-08-04 MED ORDER — DEXMEDETOMIDINE HCL IN NACL 80 MCG/20ML IV SOLN
INTRAVENOUS | Status: DC | PRN
  Administered 2023-08-04: 4 ug via INTRAVENOUS
  Administered 2023-08-04: 8 ug via INTRAVENOUS
  Administered 2023-08-04 (×3): 4 ug via INTRAVENOUS
  Administered 2023-08-04: 8 ug via INTRAVENOUS
  Administered 2023-08-04 (×2): 4 ug via INTRAVENOUS

## 2023-08-04 MED ORDER — PHENYLEPHRINE 80 MCG/ML (10ML) SYRINGE FOR IV PUSH (FOR BLOOD PRESSURE SUPPORT)
PREFILLED_SYRINGE | INTRAVENOUS | Status: AC
Start: 2023-08-04 — End: ?
  Filled 2023-08-04: qty 10

## 2023-08-04 MED ORDER — ADULT MULTIVITAMIN W/MINERALS CH
1.0000 | ORAL_TABLET | Freq: Every day | ORAL | Status: DC
  Administered 2023-08-05 – 2023-08-06 (×2): 1 via ORAL
  Filled 2023-08-04 (×2): qty 1

## 2023-08-04 MED ORDER — LORATADINE 10 MG PO TABS
10.0000 mg | ORAL_TABLET | Freq: Every day | ORAL | Status: DC
  Administered 2023-08-05 – 2023-08-06 (×2): 10 mg via ORAL
  Filled 2023-08-04 (×2): qty 1

## 2023-08-04 MED ORDER — CEFAZOLIN SODIUM 1 G IJ SOLR
INTRAMUSCULAR | Status: AC
Start: 2023-08-04 — End: ?
  Filled 2023-08-04: qty 20

## 2023-08-04 MED ORDER — PIOGLITAZONE HCL 30 MG PO TABS
30.0000 mg | ORAL_TABLET | Freq: Every day | ORAL | Status: DC
  Administered 2023-08-04 – 2023-08-06 (×3): 30 mg via ORAL
  Filled 2023-08-04 (×3): qty 1

## 2023-08-04 MED ORDER — ARTIFICIAL TEARS OPHTHALMIC OINT
TOPICAL_OINTMENT | OPHTHALMIC | Status: DC | PRN
  Administered 2023-08-04: 1 via OPHTHALMIC

## 2023-08-04 MED ORDER — CEFAZOLIN SODIUM-DEXTROSE 2-4 GM/100ML-% IV SOLN
2.0000 g | INTRAVENOUS | Status: AC
Start: 2023-08-04 — End: 2023-08-04
  Administered 2023-08-04 (×2): 2 g via INTRAVENOUS

## 2023-08-04 MED ORDER — KETAMINE HCL 50 MG/5ML IJ SOSY
PREFILLED_SYRINGE | INTRAMUSCULAR | Status: AC
Start: 2023-08-04 — End: ?
  Filled 2023-08-04: qty 5

## 2023-08-04 MED ORDER — PHENYLEPHRINE HCL-NACL 20-0.9 MG/250ML-% IV SOLN
INTRAVENOUS | Status: AC
  Filled 2023-08-04: qty 250

## 2023-08-04 MED ORDER — LABETALOL HCL 5 MG/ML IV SOLN: 10.0000 mg | INTRAVENOUS | Status: DC | PRN

## 2023-08-04 MED ORDER — PHENYLEPHRINE HCL-NACL 20-0.9 MG/250ML-% IV SOLN
INTRAVENOUS | Status: DC | PRN
  Administered 2023-08-04: 40 ug/min via INTRAVENOUS

## 2023-08-04 MED ORDER — PHENYLEPHRINE 80 MCG/ML (10ML) SYRINGE FOR IV PUSH (FOR BLOOD PRESSURE SUPPORT)
PREFILLED_SYRINGE | INTRAVENOUS | Status: DC | PRN
  Administered 2023-08-04 (×2): 80 ug via INTRAVENOUS

## 2023-08-04 MED ORDER — NITROGLYCERIN IN D5W 200-5 MCG/ML-% IV SOLN
INTRAVENOUS | Status: AC
  Filled 2023-08-04: qty 250

## 2023-08-04 MED ORDER — ONDANSETRON HCL 4 MG/2ML IJ SOLN
INTRAMUSCULAR | Status: AC
  Filled 2023-08-04: qty 2

## 2023-08-04 MED ORDER — NITROGLYCERIN IN D5W 200-5 MCG/ML-% IV SOLN: 5.0000 ug/min | INTRAVENOUS | Status: DC

## 2023-08-04 MED ORDER — SODIUM CHLORIDE 0.9 % IV SOLN: 500.0000 mL | Freq: Once | INTRAVENOUS | Status: DC | PRN

## 2023-08-04 MED ORDER — ROCURONIUM BROMIDE 10 MG/ML (PF) SYRINGE
PREFILLED_SYRINGE | INTRAVENOUS | Status: AC
Start: 2023-08-04 — End: ?
  Filled 2023-08-04: qty 10

## 2023-08-04 MED ORDER — ROCURONIUM BROMIDE 10 MG/ML (PF) SYRINGE
PREFILLED_SYRINGE | INTRAVENOUS | Status: AC
  Filled 2023-08-04: qty 10

## 2023-08-04 MED ORDER — ALBUMIN HUMAN 5 % IV SOLN
INTRAVENOUS | Status: AC
  Filled 2023-08-04: qty 500

## 2023-08-04 MED ORDER — ACETAMINOPHEN 10 MG/ML IV SOLN
INTRAVENOUS | Status: DC | PRN
  Administered 2023-08-04: 1000 mg via INTRAVENOUS

## 2023-08-04 MED ORDER — GLIPIZIDE ER 10 MG PO TB24
10.0000 mg | ORAL_TABLET | Freq: Every day | ORAL | Status: DC
  Administered 2023-08-05 – 2023-08-06 (×2): 10 mg via ORAL
  Filled 2023-08-04 (×2): qty 1

## 2023-08-04 MED ORDER — ALBUTEROL SULFATE (2.5 MG/3ML) 0.083% IN NEBU: 3.0000 mL | INHALATION_SOLUTION | RESPIRATORY_TRACT | Status: DC | PRN

## 2023-08-04 MED ORDER — VANCOMYCIN HCL 1000 MG IV SOLR
INTRAVENOUS | Status: AC
Start: 2023-08-04 — End: ?
  Filled 2023-08-04: qty 20

## 2023-08-04 MED ORDER — DOCUSATE SODIUM 100 MG PO CAPS
100.0000 mg | ORAL_CAPSULE | Freq: Every day | ORAL | Status: DC
  Administered 2023-08-05 – 2023-08-06 (×2): 100 mg via ORAL
  Filled 2023-08-04 (×2): qty 1

## 2023-08-04 MED ORDER — MAGNESIUM SULFATE 2 GM/50ML IV SOLN: 2.0000 g | Freq: Every day | INTRAVENOUS | Status: DC | PRN

## 2023-08-04 MED ORDER — DEXAMETHASONE SODIUM PHOSPHATE 10 MG/ML IJ SOLN
INTRAMUSCULAR | Status: DC | PRN
  Administered 2023-08-04: 10 mg via INTRAVENOUS

## 2023-08-04 MED ORDER — ALLOPURINOL 100 MG PO TABS
200.0000 mg | ORAL_TABLET | Freq: Every day | ORAL | Status: DC
  Administered 2023-08-05 – 2023-08-06 (×2): 200 mg via ORAL
  Filled 2023-08-04 (×2): qty 2

## 2023-08-04 MED ORDER — SODIUM CHLORIDE 0.9 % IV SOLN
INTRAVENOUS | Status: DC | PRN
  Administered 2023-08-04: 500 mL

## 2023-08-04 MED ORDER — SUCCINYLCHOLINE CHLORIDE 200 MG/10ML IV SOSY
PREFILLED_SYRINGE | INTRAVENOUS | Status: AC
  Filled 2023-08-04: qty 10

## 2023-08-04 MED ORDER — ENOXAPARIN SODIUM 40 MG/0.4ML IJ SOSY
40.0000 mg | PREFILLED_SYRINGE | INTRAMUSCULAR | Status: DC
  Administered 2023-08-05 – 2023-08-06 (×2): 40 mg via SUBCUTANEOUS
  Filled 2023-08-04 (×2): qty 0.4

## 2023-08-04 MED ORDER — HYDROMORPHONE HCL 1 MG/ML IJ SOLN
INTRAMUSCULAR | Status: DC | PRN
  Administered 2023-08-04 (×2): .5 mg via INTRAVENOUS

## 2023-08-04 MED ORDER — MORPHINE SULFATE (PF) 2 MG/ML IV SOLN
2.0000 mg | INTRAVENOUS | Status: DC | PRN
  Administered 2023-08-04 – 2023-08-05 (×4): 2 mg via INTRAVENOUS
  Filled 2023-08-04 (×4): qty 1

## 2023-08-04 MED ORDER — HEMOSTATIC AGENTS (NO CHARGE) OPTIME
TOPICAL | Status: DC | PRN
  Administered 2023-08-04: 1 via TOPICAL

## 2023-08-04 MED ORDER — INSULIN ASPART 100 UNIT/ML IJ SOLN
0.0000 [IU] | Freq: Every day | INTRAMUSCULAR | Status: DC
  Administered 2023-08-04: 2 [IU] via SUBCUTANEOUS
  Administered 2023-08-05: 3 [IU] via SUBCUTANEOUS
  Filled 2023-08-04 (×2): qty 1

## 2023-08-04 MED ORDER — FENTANYL CITRATE (PF) 100 MCG/2ML IJ SOLN
25.0000 ug | INTRAMUSCULAR | Status: DC | PRN
Start: 2023-08-04 — End: 2023-08-04
  Administered 2023-08-04 (×2): 25 ug via INTRAVENOUS

## 2023-08-04 MED ORDER — MELATONIN 5 MG PO TABS
10.0000 mg | ORAL_TABLET | Freq: Every day | ORAL | Status: DC
  Administered 2023-08-04 – 2023-08-05 (×2): 10 mg via ORAL
  Filled 2023-08-04 (×3): qty 2

## 2023-08-04 MED ORDER — CHLORHEXIDINE GLUCONATE 0.12 % MT SOLN
OROMUCOSAL | Status: AC
  Filled 2023-08-04: qty 15

## 2023-08-04 MED ORDER — KETAMINE HCL 10 MG/ML IJ SOLN: INTRAMUSCULAR | Status: DC | PRN

## 2023-08-04 MED ORDER — ROCURONIUM BROMIDE 100 MG/10ML IV SOLN
INTRAVENOUS | Status: DC | PRN
  Administered 2023-08-04: 20 mg via INTRAVENOUS
  Administered 2023-08-04: 70 mg via INTRAVENOUS
  Administered 2023-08-04: 30 mg via INTRAVENOUS
  Administered 2023-08-04 (×2): 20 mg via INTRAVENOUS
  Administered 2023-08-04 (×2): 30 mg via INTRAVENOUS

## 2023-08-04 MED ORDER — ONDANSETRON HCL 4 MG/2ML IJ SOLN
4.0000 mg | Freq: Four times a day (QID) | INTRAMUSCULAR | Status: DC | PRN
  Administered 2023-08-05: 4 mg via INTRAVENOUS

## 2023-08-04 MED ORDER — SORBITOL 70 % SOLN: 30.0000 mL | Freq: Every day | Status: DC | PRN

## 2023-08-04 MED ORDER — IRBESARTAN 150 MG PO TABS
300.0000 mg | ORAL_TABLET | Freq: Every day | ORAL | Status: DC
  Administered 2023-08-04 – 2023-08-06 (×3): 300 mg via ORAL
  Filled 2023-08-04 (×3): qty 2

## 2023-08-04 MED ORDER — OXYCODONE HCL 5 MG/5ML PO SOLN: 5.0000 mg | Freq: Once | ORAL | Status: DC | PRN

## 2023-08-04 MED ORDER — SEVOFLURANE IN SOLN
RESPIRATORY_TRACT | Status: AC
  Filled 2023-08-04: qty 250

## 2023-08-04 MED ORDER — INSULIN ASPART 100 UNIT/ML IJ SOLN
0.0000 [IU] | Freq: Three times a day (TID) | INTRAMUSCULAR | Status: DC
  Administered 2023-08-05 (×2): 5 [IU] via SUBCUTANEOUS
  Administered 2023-08-05: 2 [IU] via SUBCUTANEOUS
  Administered 2023-08-06 (×2): 3 [IU] via SUBCUTANEOUS
  Filled 2023-08-04 (×5): qty 1

## 2023-08-04 MED ORDER — ATORVASTATIN CALCIUM 20 MG PO TABS
80.0000 mg | ORAL_TABLET | Freq: Every day | ORAL | Status: DC
  Administered 2023-08-05 – 2023-08-06 (×2): 80 mg via ORAL
  Filled 2023-08-04 (×2): qty 4

## 2023-08-04 MED ORDER — ONDANSETRON HCL 4 MG/2ML IJ SOLN
4.0000 mg | Freq: Four times a day (QID) | INTRAMUSCULAR | Status: DC | PRN
  Filled 2023-08-04: qty 2

## 2023-08-04 MED ORDER — KETAMINE HCL 50 MG/5ML IJ SOSY
PREFILLED_SYRINGE | INTRAMUSCULAR | Status: DC | PRN
  Administered 2023-08-04 (×2): 25 mg via INTRAVENOUS

## 2023-08-04 MED ORDER — PROPOFOL 10 MG/ML IV BOLUS
INTRAVENOUS | Status: AC
  Filled 2023-08-04: qty 40

## 2023-08-04 MED ORDER — OXYCODONE HCL 5 MG PO TABS
5.0000 mg | ORAL_TABLET | Freq: Once | ORAL | Status: DC | PRN
Start: 2023-08-04 — End: 2023-08-04

## 2023-08-04 MED ORDER — SENNOSIDES-DOCUSATE SODIUM 8.6-50 MG PO TABS: 1.0000 | ORAL_TABLET | Freq: Every evening | ORAL | Status: DC | PRN

## 2023-08-04 SURGICAL SUPPLY — 108 items
5 PAIRS OF YELLOW SUTURE CLAMP (MISCELLANEOUS) ×2
ADH SKN CLS APL DERMABOND .7 (GAUZE/BANDAGES/DRESSINGS)
APL PRP STRL LF DISP 70% ISPRP (MISCELLANEOUS) ×2
APPLIER CLIP 11 MED OPEN (CLIP)
APPLIER CLIP 9.375 SM OPEN (CLIP)
APR CLP MED 11 20 MLT OPN (CLIP)
APR CLP SM 9.3 20 MLT OPN (CLIP)
BAG DECANTER FOR FLEXI CONT (MISCELLANEOUS) ×2 IMPLANT
BAG ISL LRG 20X20 DRWSTRG (DRAPES)
BAG ISOLATATION DRAPE 20X20 ST (DRAPES) IMPLANT
BALLN ARMADA 3.0X100X150 (BALLOONS) ×2
BALLN DORADO 7X200X80 (BALLOONS) ×4
BALLN LUTONIX 018 4X220X130 (BALLOONS) ×2
BALLN ULTRVRSE 3X40X75C (BALLOONS) ×2
BALLN ULTRVRSE 5X150X150 (BALLOONS) ×2
BALLOON ARMADA 3.0X100X150 (BALLOONS) IMPLANT
BALLOON DORADO 7X200X80 (BALLOONS) IMPLANT
BALLOON LUTONIX 018 4X220X130 (BALLOONS) IMPLANT
BALLOON ULTRVRSE 3X40X75C (BALLOONS) IMPLANT
BALLOON ULTRVRSE 5X150X150 (BALLOONS) IMPLANT
BLADE SURG 15 STRL LF DISP TIS (BLADE) ×2 IMPLANT
BLADE SURG 15 STRL SS (BLADE) ×2
BLADE SURG SZ11 CARB STEEL (BLADE) ×2 IMPLANT
BRUSH SCRUB EZ 4% CHG (MISCELLANEOUS) ×2 IMPLANT
CATH 0.018 NAVICROSS ANG 135 (CATHETERS) IMPLANT
CATH BEACON 5 .035 65 KMP TIP (CATHETERS) IMPLANT
CATH NAVICROSS ANGLED 90CM (MICROCATHETER) IMPLANT
CATH SEEKER .035X135CM (CATHETERS) IMPLANT
CHLORAPREP W/TINT 26 (MISCELLANEOUS) ×2 IMPLANT
CLAMP SUTURE YELLOW 5 PAIRS (MISCELLANEOUS) ×2 IMPLANT
CLIP APPLIE 11 MED OPEN (CLIP) IMPLANT
CLIP APPLIE 9.375 SM OPEN (CLIP) IMPLANT
COVER PROBE FLX POLY STRL (MISCELLANEOUS) IMPLANT
DERMABOND ADVANCED .7 DNX12 (GAUZE/BANDAGES/DRESSINGS) IMPLANT
DEVICE RAD COMP TR BAND LRG (VASCULAR PRODUCTS) IMPLANT
DEVICE TORQUE (MISCELLANEOUS) IMPLANT
DRAPE INCISE IOBAN 66X45 STRL (DRAPES) ×2 IMPLANT
DRAPE INCISE IOBAN 66X60 STRL (DRAPES) IMPLANT
DRESSING SURGICEL FIBRLLR 1X2 (HEMOSTASIS) ×2 IMPLANT
DRSG OPSITE POSTOP 4X6 (GAUZE/BANDAGES/DRESSINGS) IMPLANT
DRSG SURGICEL FIBRILLAR 1X2 (HEMOSTASIS) ×2
ELECT CAUTERY BLADE 6.4 (BLADE) ×2 IMPLANT
ELECT REM PT RETURN 9FT ADLT (ELECTROSURGICAL) ×2
ELECTRODE REM PT RTRN 9FT ADLT (ELECTROSURGICAL) ×2 IMPLANT
ENSNARE 9-15 (MISCELLANEOUS) IMPLANT
GAUZE 4X4 16PLY ~~LOC~~+RFID DBL (SPONGE) ×2 IMPLANT
GLIDEWIRE ADV .035X260CM (WIRE) IMPLANT
GLOVE BIO SURGEON STRL SZ7 (GLOVE) ×6 IMPLANT
GOWN STRL REUS W/ TWL LRG LVL3 (GOWN DISPOSABLE) ×4 IMPLANT
GOWN STRL REUS W/ TWL XL LVL3 (GOWN DISPOSABLE) ×2 IMPLANT
GOWN STRL REUS W/TWL 2XL LVL3 (GOWN DISPOSABLE) ×2 IMPLANT
GOWN STRL REUS W/TWL LRG LVL3 (GOWN DISPOSABLE) ×4
GOWN STRL REUS W/TWL XL LVL3 (GOWN DISPOSABLE) ×2
GRAFT VASC PATCH XENOSURE 1X14 (Vascular Products) IMPLANT
GUIDEWIRE PFTE-COATED .018X300 (WIRE) IMPLANT
IV NS 500ML (IV SOLUTION) ×2
IV NS 500ML BAXH (IV SOLUTION) ×2 IMPLANT
KIT ENCORE 26 ADVANTAGE (KITS) IMPLANT
KIT PREVENA INCISION MGT 13 (CANNISTER) IMPLANT
KIT STIMULAN RAPID CURE 5CC (Orthopedic Implant) IMPLANT
KIT TURNOVER KIT A (KITS) ×2 IMPLANT
LABEL OR SOLS (LABEL) ×2 IMPLANT
LOOP VESSEL MAXI 1X406 RED (MISCELLANEOUS) ×4 IMPLANT
LOOP VESSEL MINI 0.8X406 BLUE (MISCELLANEOUS) ×4 IMPLANT
MANIFOLD NEPTUNE II (INSTRUMENTS) ×2 IMPLANT
NDL SAFETY ECLIPSE 18X1.5 (NEEDLE) ×2 IMPLANT
NS IRRIG 500ML POUR BTL (IV SOLUTION) ×2 IMPLANT
PACK BASIN MAJOR ARMC (MISCELLANEOUS) ×2 IMPLANT
PACK UNIVERSAL (MISCELLANEOUS) ×2 IMPLANT
PENCIL SMOKE EVACUATOR (MISCELLANEOUS) IMPLANT
RETRACTOR TRAXI PANNICULUS (MISCELLANEOUS) IMPLANT
SET WALTER ACTIVATION W/DRAPE (SET/KITS/TRAYS/PACK) ×2 IMPLANT
SHEATH BRITE TIP 6FRX11 (SHEATH) IMPLANT
SHEATH BRITE TIP 7FRX11 (SHEATH) IMPLANT
SHEATH HALO 035 6FRX10 (SHEATH) IMPLANT
SHEATH MICROPUNCTURE PEDAL 4FR (SHEATH) IMPLANT
SPONGE T-LAP 18X18 ~~LOC~~+RFID (SPONGE) ×4 IMPLANT
STAPLER SKIN PROX 35W (STAPLE) ×2 IMPLANT
STENT VIABAHN 15X120X87FR (Permanent Stent) ×2 IMPLANT
STENT VIABAHN 7X25X120 (Permanent Stent) IMPLANT
STENT VIABAHN 8X150X120 (Permanent Stent) ×2 IMPLANT
STENT VIABAHN 8X15X120 7FR (Permanent Stent) IMPLANT
STENT VIABAHN25X120X7X (Permanent Stent) ×2 IMPLANT
SUT MNCRL 4-0 (SUTURE)
SUT MNCRL 4-0 27XMFL (SUTURE)
SUT PROLENE 5 0 RB 1 DA (SUTURE) ×4 IMPLANT
SUT PROLENE 6 0 BV (SUTURE) ×8 IMPLANT
SUT PROLENE 7 0 BV 1 (SUTURE) ×4 IMPLANT
SUT SILK 2 0 (SUTURE) ×2
SUT SILK 2-0 18XBRD TIE 12 (SUTURE) ×2 IMPLANT
SUT SILK 3 0 (SUTURE) ×2
SUT SILK 3-0 18XBRD TIE 12 (SUTURE) ×2 IMPLANT
SUT SILK 4 0 (SUTURE) ×2
SUT SILK 4-0 18XBRD TIE 12 (SUTURE) ×2 IMPLANT
SUT VIC AB 2-0 CT1 27 (SUTURE) ×6
SUT VIC AB 2-0 CT1 TAPERPNT 27 (SUTURE) ×4 IMPLANT
SUT VIC AB 3-0 SH 27 (SUTURE) ×4
SUT VIC AB 3-0 SH 27X BRD (SUTURE) ×2 IMPLANT
SUT VICRYL+ 3-0 36IN CT-1 (SUTURE) ×4 IMPLANT
SUTURE MNCRL 4-0 27XMF (SUTURE) IMPLANT
SYR 20ML LL LF (SYRINGE) ×2 IMPLANT
SYR 5ML LL (SYRINGE) ×2 IMPLANT
TAG SUTURE CLAMP YLW 5PR (MISCELLANEOUS) ×2
TRAP FLUID SMOKE EVACUATOR (MISCELLANEOUS) ×2 IMPLANT
TRAY FOLEY MTR SLVR 16FR STAT (SET/KITS/TRAYS/PACK) ×2 IMPLANT
WATER STERILE IRR 500ML POUR (IV SOLUTION) ×2 IMPLANT
WIRE G V18X300CM (WIRE) IMPLANT
WIRE SHEPHERD 30G .018 (WIRE) IMPLANT

## 2023-08-04 NOTE — OR Nursing (Signed)
TR BAND PLACED ON LEFT ANKLE BY VASCULAR TEAM. 10 MLs OF AIR PLACED IN BALLOON

## 2023-08-04 NOTE — Op Note (Signed)
OPERATIVE NOTE   PROCEDURE: 1.   Left common femoral, profunda femoris, and superficial femoral artery endarterectomies and patch angioplasty with Bovine pericardial patch 2.   Left lower extremity angiogram 3.   Ultrasound guidance for vascular access left posterior tibial artery 4.   Stent placement x 2 to the left SFA and above-knee popliteal artery with 7 mm diameter by 25 cm length Viabahn stent and 8 mm diameter by 15 cm length Viabahn stent     PRE-OPERATIVE DIAGNOSIS: 1.Atherosclerotic occlusive disease with claudication and early rest pain left lower extremity   POST-OPERATIVE DIAGNOSIS: Same  SURGEON: Festus Barren, MD  CO-surgeon:  Levora Dredge, MD  ANESTHESIA:  general  ESTIMATED BLOOD LOSS: 200 cc  FINDING(S): 1.  significant plaque in left common femoral, profunda femoris, and superficial femoral arteries 2.  Long left SFA occlusion that we ultimately crossed from posterior tibial access and then snared the wire to work from the femoral access  SPECIMEN(S):  Left common femoral, profunda femoris, and superficial femoral artery plaque.  INDICATIONS:    Patient presents with disabling claudication symptoms now progressing to rest pain of the left lower extremity.  Left femoral endarterectomy and left SFA stent placement is planned to try to improve perfusion.  The risks and benefits as well as alternative therapies including intervention were reviewed in detail all questions were answered the patient agrees to proceed with surgery.  DESCRIPTION: After obtaining full informed written consent, the patient was brought back to the operating room and placed supine upon the operating table.  The patient received IV antibiotics prior to induction.  After obtaining adequate anesthesia, the patient was prepped and draped in the standard fashion appropriate time out is called.    Vertical incision was created overlying the left femoral arteries. The common femoral artery  proximally, and superficial femoral artery, and primary profunda femoris artery branches were encircled with vessel loops and prepared for control. The left femoral arteries were found to have significant plaque from the common femoral artery into the profunda and superficial femoral arteries.   7000 units of heparin was given and allowed circulate for 5 minutes.   Attention is then turned to the left femoral artery.  An arteriotomy is made with 11 blade and extended with Potts scissors in the common femoral artery and carried down onto the first 3-4 cm of the official femoral artery. An endarterectomy was then performed. The [facility] was used to create a plane. The proximal endpoint was created with gentle traction. This was in the proximal common femoral artery. An eversion endarterectomy was then performed for the first 2-3 cm of the profunda femoris artery. Good backbleeding was then seen. The distal endpoint of the superficial femoral endarterectomy was created with gentle traction and the distal endpoint was about 3 to 4 cm beyond the origin of the superficial femoral artery.  The profunda femoris origin was tacked down with a total of four 6-0 Prolene sutures.  The bovine pericardial patcth is then selected and prepared for a patch angioplasty.  It is cut and beveled and started at the proximal endpoint with a 6-0 Prolene suture.  Approximately one half of the suture line is run medially and laterally and the distal end point was cut and bevelled to match the arteriotomy.  A second 6-0 Prolene was started at the distal end point and run to the mid portion to complete the arteriotomy.  The vessel was flushed prior to release of control and completion of the  anastomosis.  At this point, flow was established first to the profunda femoris artery and then to the superficial femoral artery.   At this point, we accessed the proximal to midportion of the patch with a Seldinger needle and placed a 7  French sheath into the proximal superficial femoral artery.  Selective left lower extremity imaging was performed showing long segment occlusion of the left SFA just beyond its origin with reconstitution near Hunter's canal although there was still disease in the above-knee popliteal artery below the occlusion.  There was then three-vessel runoff distally without significant tibial disease.  Attempts were made to cross the occlusion with a Kumpe catheter and advantage wire but dissection planes were created and I was never able to cross the occlusion from the femoral approach.  Decision was made to access the left posterior tibial artery.  The left posterior tibial artery was visualized with ultrasound and found to be patent.  It was then accessed under direct ultrasound guidance without difficulty with a micropuncture needle and a permanent image was recorded.  A micropuncture wire and sheath were then placed.  We then upsized to a 5 French slender sheath in the left posterior tibial artery.  Using a Kumpe catheter and a 0.018 advantage wire I was able to get up into the occluded left SFA.  The Kumpe catheter would not cross the very calcified occlusion at Hunter's canal and actually neither with a Nava cross catheter.  Ultimately the wire was able to be passed up into what was clearly a luminal plane in the left common femoral artery.  We then used the femoral access to snare the wire from below and pulled the wire out the left femoral sheath.  A 3 and 4 mm diameter angioplasty balloon were used to predilate the lesion.  We then placed a Nava cross catheter from the femoral approach down to the left posterior tibial artery and the sheath remained in the left posterior tibial artery until the conclusion of the procedure when a TR band was placed for hemostasis.  We now did our work through the femoral sheath.  We exchanged for a 0.018 V18 wire and then predilated the very calcified Hunter's canal lesion with a 5 mm  diameter conventional angioplasty balloon.  High-grade residual stenosis remained and there was diffuse greater than 50% stenosis throughout the remainder of the SFA and proximal popliteal arteries after treatment with a 4 to 5 mm balloon.  A 7 mm diameter by 25 cm length Viabahn stent was deployed down to the mid popliteal artery below the disease and then an 8 mm diameter by 15 cm length Viabahn stent was taken up to the proximal SFA just into the patched area from the endarterectomy.  This was postdilated with a 7 mm diameter high-pressure angioplasty balloon.  Completion imaging showed marked improvement with only about a 10 to 15% residual stenosis in the left SFA and popliteal arteries after stent placement with preserved runoff distally.  It also showed the profunda femoris artery now to be widely patent after endarterectomy as well as the common femoral artery.  The sheath was then removed with a 5-0 Prolene pursestring suture placed at the access site from the pericardial patch.  Vistacel and Fibrillar topical hemostatic agents were placed in the femoral incision and hemostasis was complete.  Gentamicin and vancomycin impregnated beads were then placed in the wound as well.  The femoral incision was then closed in a layered fashion with 2 layers of 2-0  Vicryl, 2 layers of 3-0 Vicryl, and 3-0 nylon for the skin closure.  An incisional dressing was then placed over the wound.  This was a disposable VAC with sponge and suction tubing connected to occlusive dressing.  A good seal was obtained on placement of the VAC dressing over the wound.  The patient was then awakened from anesthesia and taken to the recovery room in stable condition having tolerated the procedure well.  COMPLICATIONS: None  CONDITION: Stable     Festus Barren 08/04/2023 12:46 PM   This note was created with Dragon Medical transcription system. Any errors in dictation are purely unintentional.

## 2023-08-04 NOTE — Transfer of Care (Signed)
Immediate Anesthesia Transfer of Care Note  Patient: Kevin Huerta  Procedure(s) Performed: ENDARTERECTOMY FEMORAL (SFA STENT PLACEMENT) (Left: Groin) APPLICATION OF CELL SAVER (Left)  Patient Location: PACU  Anesthesia Type:General  Level of Consciousness: sedated, patient cooperative, and responds to stimulation  Airway & Oxygen Therapy: Patient Spontanous Breathing and Patient connected to face mask oxygen  Post-op Assessment: Report given to RN and Post -op Vital signs reviewed and stable  Post vital signs: stable  Last Vitals:  Vitals Value Taken Time  BP 154/75 08/04/23 1254  Temp    Pulse 82 08/04/23 1257  Resp 17 08/04/23 1257  SpO2 99 % 08/04/23 1257  Vitals shown include unfiled device data.  Last Pain:  Vitals:   08/04/23 0618  TempSrc: Oral  PainSc: 0-No pain         Complications: No notable events documented.

## 2023-08-04 NOTE — Anesthesia Procedure Notes (Cosign Needed)
Arterial Line Insertion Start/End10/30/2024 7:50 AM, 08/04/2023 7:54 AM Performed by: Stephanie Coup, MD, Emeterio Reeve, CRNA, CRNA  Patient location: OR. Preanesthetic checklist: patient identified, IV checked, site marked, risks and benefits discussed, surgical consent, monitors and equipment checked, pre-op evaluation, timeout performed and anesthesia consent Patient sedated Left, radial was placed Catheter size: 20 G Hand hygiene performed   Attempts: 1 Procedure performed using ultrasound guided technique. Ultrasound Notes:anatomy identified, needle tip was noted to be adjacent to the nerve/plexus identified and no ultrasound evidence of intravascular and/or intraneural injection Following insertion, dressing applied. Post procedure assessment: normal  Patient tolerated the procedure well with no immediate complications.

## 2023-08-04 NOTE — H&P (Signed)
[facility] Admission History & Physical  MRN : [id number]  Kevin Huerta is a 57 y.o. (16-Nov-1965) male who presents with chief complaint of No chief complaint on file. Marland Kitchen  History of Present Illness: The patient was unable to undergo intervention as it was found that his SFA occlusion was not amenable to endovascular intervention.  Since that time the patient has developed worsening symptoms of claudication which are disabling for him.  He also has begun to develop some mild rest pain.  There have been no developments of open wounds or ulcerations   There have been no significant changes to the patient's overall health care.   No documented history of amaurosis fugax or recent TIA symptoms. There are no recent neurological changes noted. No documented history of DVT, PE or superficial thrombophlebitis. The patient denies recent episodes of angina or shortness of breath.  Current Facility-Administered Medications  Medication Dose Route Frequency Provider Last Rate Last Admin   0.9 %  sodium chloride infusion   Intravenous Continuous Piscitello, Cleda Mccreedy, MD       ceFAZolin (ANCEF) IVPB 2g/100 mL premix  2 g Intravenous On Call to OR Georgiana Spinner, NP        Past Medical History:  Diagnosis Date   Allergic rhinitis, seasonal    Anginal equivalent (HCC)    Anxiety    a.) on BZO (alprazolam) PRN   Aortic atherosclerosis (HCC)    Arthritis    Asthma    Atherosclerosis of native artery of both lower extremities with intermittent claudication (HCC)    a.) s/p PTA of RIGHT prox peroneal, prox SFA, popliteal, and distal SFA; 6 mm x 15 cm Viabahn stent to RIGHT distal SFA and above knee popliteal artery; b.) s/p unsuccessful PTA of occluded SFA 03/22/2023 --> plans for endarterectomy +/-  endoluminal intervention   BRBPR (bright red blood per rectum) 05/24/2020   Chest heaviness 10/27/2022   Coronary artery disease of native artery of native heart with stable  angina pectoris (HCC) 03/29/2023   a.) cCTA 03/29/2023: Ca2+ 476 (95th percentile for age/sex/race matched control)   Current vaping on some days    DDD (degenerative disc disease), cervical    a.) s/p ACDF C3-C5 2014   Dermatitis 06/02/2021   Diarrhea 02/26/2021   Emphysema lung (HCC)    Fatty liver 09/03/2020   GERD (gastroesophageal reflux disease)    Gout    Heart murmur    History of cardiac catheterization 11/27/2008   a.) LHC 11/27/2008 at [facility] --> normal coronary anatomy with no obstructive CAD   Hyperlipidemia    Hypersomnia 07/17/2014   Hypertension    Insomnia    a.) uses melatonin +/- trazodone PRN   Leg cramps 02/26/2021   Marijuana use, episodic    Occasional alcohol consumption    On long term clopidogrel therapy    OSA (obstructive sleep apnea)    a.) not currently on nocturnal PAP therapy; needs repeat PSG   Seasonal allergies    T2DM (type 2 diabetes mellitus) (HCC)    a.) monitors with Freestyle Libre CGM   Tinnitus     Past Surgical History:  Procedure Laterality Date   ANTERIOR CERVICAL DECOMP/DISCECTOMY FUSION N/A 2014   KNEE SURGERY Right    meniscus   LEFT HEART CATH AND CORONARY ANGIOGRAPHY Left 11/27/2008   Procedure: LEFT HEART CATH AND CORONARY ANGIOGRAPHY; Location: [facility]; Surgeon: Shari Prows, MD   LOWER EXTREMITY ANGIOGRAPHY  Right 11/16/2022   Procedure: Lower Extremity Angiography;  Surgeon: Annice Needy, MD;  Location: [facility] INVASIVE CV LAB;  Service: Cardiovascular;  Laterality: Right;   LOWER EXTREMITY ANGIOGRAPHY Left 03/22/2023   Procedure: Lower Extremity Angiography;  Surgeon: Annice Needy, MD;  Location: [facility] INVASIVE CV LAB;  Service: Cardiovascular;  Laterality: Left;   ROTATOR CUFF REPAIR Right    WISDOM TOOTH EXTRACTION       Social History   Tobacco Use   Smoking status: Former    Current packs/day: 0.00    Average packs/day: 1.5 packs/day for 32.0 years (48.0 ttl pk-yrs)     Types: Cigarettes, E-cigarettes    Start date: 06/11/1986    Quit date: 06/11/2018    Years since quitting: 5.1   Smokeless tobacco: Former    Types: Snuff    Quit date: 1996   Tobacco comments:    I vape weekly."  Vaping Use   Vaping status: Some Days   Substances: Nicotine   Devices: x3 per day to quit smoking nicotine   Substance Use Topics   Alcohol use: Yes    Comment: cut back, 6 pack per month   Drug use: Not Currently    Types: Marijuana    Comment: a few times a year     Family History  Problem Relation Age of Onset   Heart attack Father 43   Alcohol abuse Father    Drug abuse Father    Heart disease Father    Lung cancer Father    Hypertension Mother    Heart attack Maternal Grandmother 11   Stroke Maternal Aunt 69    Allergies  Allergen Reactions   Aspirin Tinitus     REVIEW OF SYSTEMS (Negative unless checked)  Constitutional: [] Weight loss  [] Fever  [] Chills Cardiac: [] Chest pain   [] Chest pressure   [] Palpitations   [] Shortness of breath when laying flat   [] Shortness of breath at rest   [] Shortness of breath with exertion. Vascular:  [x] Pain in legs with walking   [] Pain in legs at rest   [] Pain in legs when laying flat   [x] Claudication   [] Pain in feet when walking  [x] Pain in feet at rest  [] Pain in feet when laying flat   [] History of DVT   [] Phlebitis   [] Swelling in legs   [] Varicose veins   [] Non-healing ulcers Pulmonary:   [] Uses home oxygen   [] Productive cough   [] Hemoptysis   [] Wheeze  [] COPD   [] Asthma Neurologic:  [] Dizziness  [] Blackouts   [] Seizures   [] History of stroke   [] History of TIA  [] Aphasia   [] Temporary blindness   [] Dysphagia   [] Weakness or numbness in arms   [] Weakness or numbness in legs Musculoskeletal:  [x] Arthritis   [] Joint swelling   [x] Joint pain   [] Low back pain Hematologic:  [] Easy bruising  [] Easy bleeding   [] Hypercoagulable state   [] Anemic  [] Hepatitis Gastrointestinal:  [] Blood in stool   [] Vomiting blood   [x] Gastroesophageal reflux/heartburn   [] Difficulty swallowing. Genitourinary:  [] Chronic kidney disease   [] Difficult urination  [] Frequent urination  [] Burning with urination   [] Blood in urine Skin:  [] Rashes   [] Ulcers   [] Wounds Psychological:  [] History of anxiety   []  History of major depression.  Physical Examination  Vitals:   08/04/23 0618  BP: (!) 155/83  Pulse: 77  Resp: 17  Temp: 97.7 F (36.5 C)  TempSrc: Oral  SpO2: 97%  Weight: 113.4 kg  Height: 5' 9.5" (1.765  m)   Body mass index is 36.39 kg/m. Gen: WD/WN, NAD Head: [place]/AT, No temporalis wasting.  Ear/Nose/Throat: Hearing grossly intact, nares w/o erythema or drainage, oropharynx w/o Erythema/Exudate,  Eyes: Conjunctiva clear, sclera non-icteric Neck: Trachea midline.  No JVD.  Pulmonary:  Good air movement, respirations not labored, no use of accessory muscles.  Cardiac: RRR, normal S1, S2. Vascular:  Vessel Right Left  Radial Palpable Palpable                          PT 1+ Palpable 1+ Palpable  DP 2+ Palpable 1+ Palpable   Gastrointestinal: soft, non-tender/non-distended. No guarding/reflex.  Musculoskeletal: M/S 5/5 throughout.  Extremities without ischemic changes.  No deformity or atrophy.  Neurologic: Sensation grossly intact in extremities.  Symmetrical.  Speech is fluent. Motor exam as listed above. Psychiatric: Judgment intact, Mood & affect appropriate for pt's clinical situation. Dermatologic: No rashes or ulcers noted.  No cellulitis or open wounds. Lymph : No Cervical, Axillary, or Inguinal lymphadenopathy.     CBC Lab Results  Component Value Date   WBC 4.2 07/27/2023   HGB 14.1 07/27/2023   HCT 44.9 07/27/2023   MCV 85.0 07/27/2023   PLT 257 07/27/2023    BMET    Component Value Date/Time   NA 140 07/27/2023 1129   K 4.9 07/27/2023 1129   CL 103 07/27/2023 1129   CO2 27 07/27/2023 1129   GLUCOSE 249 (H) 07/27/2023 1129   BUN 19 07/27/2023 1129   CREATININE 1.15  07/27/2023 1129   CALCIUM 9.8 07/27/2023 1129   GFRNONAA >60 07/27/2023 1129   GFRAA >60 06/17/2018 1512   Estimated Creatinine Clearance: 88.7 mL/min (by C-G formula based on SCr of 1.15 mg/dL).  COAG Lab Results  Component Value Date   INR 0.98 01/17/2010    Radiology No results found.   Assessment/Plan 1. Atherosclerosis of native artery of left lower extremity with rest pain (HCC)  Recommend:   The patient has evidence of severe atherosclerotic changes of both lower extremities associated with disabling claudication and developing rest pain of his left lower extremity.     Angiography has been performed and the situation is not ideal for intervention.  Given this finding open surgical repair is recommended.    Attempts to cross the long SFA occlusion at the time of angiogram were unsuccessful. However, with his common femoral disease he needs a hybrid procedure with a femoral endarterectomy as well as potential endoluminal intervention for the SFA occlusion.    The risks and benefits as well as the alternative therapies was discussed in detail with the patient.  All questions were answered.  Patient agrees to proceed with open vascular surgical reconstruction.   Open vascular surgical reconstruction is required to be an inpatient procedure.  Given the patient's comorbidities of asthma with evidence of emphysema on scans in addition to coronary calcifications, it would be in his best interest to the inpatient to ensure no significant issues occur postsurgery.  Typically this is an average 1-2 night stay, provided no complications. The patient will follow up with me in the office after the procedure.      2. Type 2 diabetes mellitus without complication, without long-term current use of insulin (HCC) Continue hypoglycemic medications as already ordered, these medications have been reviewed and there are no changes at this time.   Hgb A1C to be monitored as already arranged by  primary service   3. Hyperlipidemia associated with  type 2 diabetes mellitus (HCC) Continue statin as ordered and reviewed, no changes at this time  Festus Barren, MD  08/04/2023 7:27 AM

## 2023-08-04 NOTE — Anesthesia Procedure Notes (Cosign Needed)
Procedure Name: Intubation Date/Time: 08/04/2023 7:48 AM  Performed by: Stephanie Coup, MDPre-anesthesia Checklist: Patient identified, Emergency Drugs available, Suction available and Patient being monitored Patient Re-evaluated:Patient Re-evaluated prior to induction Oxygen Delivery Method: Circle system utilized Preoxygenation: Pre-oxygenation with 100% oxygen Induction Type: IV induction Ventilation: Mask ventilation without difficulty and Oral airway inserted - appropriate to patient size Laryngoscope Size: 4 and McGraph Grade View: Grade I Tube type: Oral Tube size: 7.5 mm Number of attempts: 1 Airway Equipment and Method: Stylet and Oral airway Placement Confirmation: ETT inserted through vocal cords under direct vision, positive ETCO2 and breath sounds checked- equal and bilateral Secured at: 22 cm Tube secured with: Tape Dental Injury: Teeth and Oropharynx as per pre-operative assessment

## 2023-08-04 NOTE — Plan of Care (Signed)
  Problem: Education: Goal: Knowledge of General Education information will improve Description: Including pain rating scale, medication(s)/side effects and non-pharmacologic comfort measures Outcome: Not Progressing   Problem: Health Behavior/Discharge Planning: Goal: Ability to manage health-related needs will improve Outcome: Not Progressing   Problem: Clinical Measurements: Goal: Ability to maintain clinical measurements within normal limits will improve Outcome: Not Progressing Goal: Will remain free from infection Outcome: Not Progressing Goal: Diagnostic test results will improve Outcome: Not Progressing Goal: Respiratory complications will improve Outcome: Not Progressing Goal: Cardiovascular complication will be avoided Outcome: Not Progressing   Problem: Activity: Goal: Risk for activity intolerance will decrease Outcome: Not Progressing   Problem: Nutrition: Goal: Adequate nutrition will be maintained Outcome: Not Progressing   Problem: Coping: Goal: Level of anxiety will decrease Outcome: Not Progressing   Problem: Elimination: Goal: Will not experience complications related to bowel motility Outcome: Not Progressing Goal: Will not experience complications related to urinary retention Outcome: Not Progressing   Problem: Pain Management: Goal: General experience of comfort will improve Outcome: Not Progressing   Problem: Safety: Goal: Ability to remain free from injury will improve Outcome: Not Progressing   Problem: Skin Integrity: Goal: Risk for impaired skin integrity will decrease Outcome: Not Progressing   Problem: Education: Goal: Knowledge of prescribed regimen will improve Outcome: Not Progressing   Problem: Activity: Goal: Ability to tolerate increased activity will improve Outcome: Not Progressing   Problem: Bowel/Gastric: Goal: Gastrointestinal status for postoperative course will improve Outcome: Not Progressing   Problem: Clinical  Measurements: Goal: Postoperative complications will be avoided or minimized Outcome: Not Progressing Goal: Signs and symptoms of graft occlusion will improve Outcome: Not Progressing   Problem: Skin Integrity: Goal: Demonstration of wound healing without infection will improve Outcome: Not Progressing

## 2023-08-04 NOTE — Progress Notes (Signed)
POST OPERATIVE CHECK:  Kevin Huerta is a 57 year old male now status postop from earlier today a left femoral endarterectomy.  Patient is resting comfortably in bed in ICU with his wife at the bedside.  Patient has Prevena wound VAC to left groin area working well.  Patient's bilateral lower extremities are warm to touch with palpable pulses.  Patient endorses some groin pain and soreness which is normal at this point.  Patient was placed on sliding scale insulin with insulin checks before meals and at bedtime.  Patient's Foley catheter remains in place overnight will be removed at 5 AM tomorrow morning.  No other complaints.  Vitals all remained stable.  Patient recovering as expected.

## 2023-08-04 NOTE — Anesthesia Postprocedure Evaluation (Signed)
Anesthesia Post Note  Patient: Kevin Huerta  Procedure(s) Performed: ENDARTERECTOMY FEMORAL (SFA STENT PLACEMENT) (Left: Groin) APPLICATION OF CELL SAVER (Left)  Patient location during evaluation: PACU Anesthesia Type: General Level of consciousness: awake and alert Pain management: pain level controlled Vital Signs Assessment: post-procedure vital signs reviewed and stable Respiratory status: spontaneous breathing, nonlabored ventilation, respiratory function stable and patient connected to nasal cannula oxygen Cardiovascular status: blood pressure returned to baseline and stable Postop Assessment: no apparent nausea or vomiting Anesthetic complications: no  No notable events documented.   Last Vitals:  Vitals:   08/04/23 1300 08/04/23 1315  BP: (!) 144/71 (!) 163/87  Pulse: 83 82  Resp: 17 16  Temp:    SpO2: 100% 99%    Last Pain:  Vitals:   08/04/23 1315  TempSrc:   PainSc: 0-No pain                 Stephanie Coup

## 2023-08-04 NOTE — Op Note (Signed)
OPERATIVE NOTE   PROCEDURE: Left common femoral, superficial femoral and profunda femoris endarterectomy with bovine pericardial patch angioplasty. Placement of a 7 French sheath into the left common femoral artery open approach. Angiography of the left lower extremity. Ultrasound-guided access to the posterior tibial artery at the level of the ankle. Open angioplasty and stent placement of the SFA using Viabahn stents.  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  ASSISTANT(S): None  ANESTHESIA: general  ESTIMATED BLOOD LOSS: 200 cc  FINDING(S): Profound calcific plaque noted of the left common femoral extending past the initial bifurcation of the profunda femoris arteries as well as down the extensive length of the SFA  SPECIMEN(S):  Calcific plaque from the common femoral, superficial femoral and the profunda femoris artery  INDICATIONS:   Kevin Huerta 57 y.o. y.o.male who presents with complaints of lifestyle limiting claudication and severe rest pain continuously in the left lower extremity. The patient has documented severe atherosclerotic occlusive disease and has undergone minimally invasive treatments in the past. However, at this point his primary area of stricture stenosis resides in the common femoral and origins of the superficial femoral and profunda femoris extending into these arteries and therefore this is not amenable to intervention. The patient is therefore undergoing open endarterectomy. The risks and benefits of surgery have been reviewed with the patient, all questions have answered; alternative therapies have been reviewed as well and the patient has agreed to proceed with surgical open repair.  DESCRIPTION: After obtaining full informed written  consent, the patient was brought back to the operating room and placed supine upon the operating table.  The patient received IV antibiotics prior to induction.  After obtaining adequate anesthesia, the patient was prepped and draped in the standard fashion for left femoral exposure.  Co-surgeons are required because this is a complicated procedure with work being performed simultaneously by both surgeons.  This expedites the procedure making a shorter operative time reducing complications and improving patient safety.  Attention was turned to the left groin with Dr. Wyn Quaker working on the patient's left and myself working on the right of the patient.  Vertical  Incision was made over the left common femoral artery and dissection carried down to the common femoral artery with electrocautery.  I dissected out the common femoral artery from the distal external iliac artery (identified by the superficial circumflex vessels) down to the femoral bifurcation.  On initial inspection, the common femoral artery was: densely calcified and there was no palpable pulse noted.    Subsequently the dissection was continued to include all circumflex branches and the profunda femoral artery and superficial femoral artery. The superficial femoral artery was dissected circumferentially for a distance of approximately 3-4 cm and the profunda femoris was dissected circumferentially out to the fourth order branches individual vessel loops were placed around each branch.  Control of all branches was obtained with vessel loops.  A softer area in the distal external iliac artery amendable to clamping was identified.    The patient was given 7000 units of Heparin intravenously, which was a therapeutic bolus.   After waiting 3 minutes, the distal external iliac artery was clamped and all of the vessel loops were placed under tension.  Arteriotomy was made in the common femoral artery with a 11-blade and extended it with a Potts scissor  proximally and distally extending the distal  end down the SFA for approximately 3 cm.   Endarterectomy was then performed under direct visualization using a freer elevator and a right angle from the mid common femoral extending up both proximally and distally. Proximally the endarterectomy was brought up to the level of the clamp where a clean edge was obtained. Distally the endarterectomy was carried down to a soft spot in the SFA where a feathered edge would was obtained.  7-0 Prolene interrupted tacking sutures were placed to secure the leading edge of the plaque in the profunda femoris.  The profunda femoris was treated with an eversion technique extending endarterectomy approximately 2 cm distally again obtaining a featheredge in the left profunda femoris.   At this point, a bovine pericardial patch was fashioned for the geometry of the arteriotomy.  The patch was sewn to the artery with 2 running stitches of 6-0 Prolene, running from each end.  Prior to completing the patch angioplasty, the profunda femoral artery was flushed as was the superficial femoral artery. The system was then forward flushed. The endarterectomy site was then irrigated copiously with heparinized saline. The patch angioplasty was completed in the usual fashion.  Flow was then reestablished first to the profunda femoris and then the superficial femoral artery. Any gaps or bleeding sites in the suture line were easily controlled with a 6-0 Prolene suture.   At this point we began the interventional portion of the case with Dr. Wyn Quaker as the primary and I was a first assistant.  Please refer to Dr. Driscilla Grammes op note for details on this portion of the surgery.  The left groin was then irrigated copiously with sterile saline and subsequently Vistaseal and Surgicel were placed in the wound.  Antibiotic beads with vancomycin and gentamicin were then placed in the incision during the closure at several levels.  The incision was repaired with  a double layer of 2-0 Vicryl, a double layer of 3-0 Vicryl, and 3-0 nylon interrupted mattress sutures.  The skin was cleaned, dried, and a Prevena disposable VAC was placed on the left femoral incision.  COMPLICATIONS: None  CONDITION: Kevin Huerta, M.D. [facility] Office: [phone]  08/04/2023, 12:50 PM

## 2023-08-04 NOTE — Anesthesia Preprocedure Evaluation (Signed)
Anesthesia Evaluation  Patient identified by MRN, date of birth, ID band Patient awake    Reviewed: Allergy & Precautions, NPO status , Patient's Chart, lab work & pertinent test results  Airway Mallampati: IV  TM Distance: >3 FB Neck ROM: full    Dental  (+) Chipped, Dental Advidsory Given   Pulmonary sleep apnea , COPD,  COPD inhaler, former smoker   Pulmonary exam normal        Cardiovascular hypertension, + CAD, + Peripheral Vascular Disease and + DOE  Normal cardiovascular exam     Neuro/Psych  PSYCHIATRIC DISORDERS Anxiety Depression    negative neurological ROS     GI/Hepatic Neg liver ROS,GERD  Medicated and Controlled,,  Endo/Other  negative endocrine ROSdiabetes    Renal/GU      Musculoskeletal   Abdominal   Peds  Hematology negative hematology ROS (+)   Anesthesia Other Findings Past Medical History: No date: Allergic rhinitis, seasonal No date: Anginal equivalent (HCC) No date: Anxiety     Comment:  a.) on BZO (alprazolam) PRN No date: Aortic atherosclerosis (HCC) No date: Arthritis No date: Asthma No date: Atherosclerosis of native artery of both lower extremities  with intermittent claudication (HCC)     Comment:  a.) s/p PTA of RIGHT prox peroneal, prox SFA, popliteal,              and distal SFA; 6 mm x 15 cm Viabahn stent to RIGHT               distal SFA and above knee popliteal artery; b.) s/p               unsuccessful PTA of occluded SFA 03/22/2023 --> plans for              endarterectomy +/-  endoluminal intervention 05/24/2020: BRBPR (bright red blood per rectum) 10/27/2022: Chest heaviness 03/29/2023: Coronary artery disease of native artery of native heart  with stable angina pectoris (HCC)     Comment:  a.) cCTA 03/29/2023: Ca2+ 476 (95th percentile for               age/sex/race matched control) No date: Current vaping on some days No date: DDD (degenerative disc disease),  cervical     Comment:  a.) s/p ACDF C3-C5 2014 06/02/2021: Dermatitis 02/26/2021: Diarrhea No date: Emphysema lung (HCC) 09/03/2020: Fatty liver No date: GERD (gastroesophageal reflux disease) No date: Gout No date: Heart murmur 11/27/2008: History of cardiac catheterization     Comment:  a.) LHC 11/27/2008 at [facility] --> normal               coronary anatomy with no obstructive CAD No date: Hyperlipidemia 07/17/2014: Hypersomnia No date: Hypertension No date: Insomnia     Comment:  a.) uses melatonin +/- trazodone PRN 02/26/2021: Leg cramps No date: Marijuana use, episodic No date: Occasional alcohol consumption No date: On long term clopidogrel therapy No date: OSA (obstructive sleep apnea)     Comment:  a.) not currently on nocturnal PAP therapy; needs repeat              PSG No date: Seasonal allergies No date: T2DM (type 2 diabetes mellitus) (HCC)     Comment:  a.) monitors with [employer] CGM No date: Tinnitus  Past Surgical History: 2014: ANTERIOR CERVICAL DECOMP/DISCECTOMY FUSION; N/A No date: KNEE SURGERY; Right     Comment:  meniscus 11/27/2008: LEFT HEART CATH AND CORONARY ANGIOGRAPHY; Left     Comment:  Procedure: LEFT HEART CATH AND CORONARY ANGIOGRAPHY;               Location: [facility]; Surgeon:               Shari Prows, MD 11/16/2022: LOWER EXTREMITY ANGIOGRAPHY; Right     Comment:  Procedure: Lower Extremity Angiography;  Surgeon: Annice Needy, MD;  Location: [facility] INVASIVE CV [facility];  Service:               Cardiovascular;  Laterality: Right; 03/22/2023: LOWER EXTREMITY ANGIOGRAPHY; Left     Comment:  Procedure: Lower Extremity Angiography;  Surgeon: Annice Needy, MD;  Location: [facility] INVASIVE CV [facility];  Service:               Cardiovascular;  Laterality: Left; No date: ROTATOR CUFF REPAIR; Right No date: WISDOM TOOTH EXTRACTION  BMI    Body Mass Index: 36.39 kg/m       Reproductive/Obstetrics negative OB ROS                             Anesthesia Physical Anesthesia Plan  ASA: 3  Anesthesia Plan: General ETT and General   Post-op Pain Management:    Induction: Intravenous  PONV Risk Score and Plan: 2 and Ondansetron, Dexamethasone and Midazolam  Airway Management Planned: Oral ETT  Additional Equipment: Arterial line  Intra-op Plan:   Post-operative Plan: Extubation in OR  Informed Consent: I have reviewed the patients History and Physical, chart, labs and discussed the procedure including the risks, benefits and alternatives for the proposed anesthesia with the patient or authorized representative who has indicated his/her understanding and acceptance.     Dental Advisory Given  Plan Discussed with: Anesthesiologist, CRNA and Surgeon  Anesthesia Plan Comments: (Patient consented for risks of anesthesia including but not limited to:  - adverse reactions to medications - damage to eyes, teeth, lips or other oral mucosa - nerve damage due to positioning  - sore throat or hoarseness - Damage to heart, brain, nerves, lungs, other parts of body or loss of life  Patient voiced understanding and assent.)       Anesthesia Quick Evaluation

## 2023-08-05 ENCOUNTER — Encounter: Payer: Self-pay | Admitting: Family Medicine

## 2023-08-05 ENCOUNTER — Encounter: Payer: Self-pay | Admitting: Vascular Surgery

## 2023-08-05 LAB — BASIC METABOLIC PANEL
Anion gap: 10 (ref 5–15)
BUN: 14 mg/dL (ref 6–20)
CO2: 22 mmol/L (ref 22–32)
Calcium: 8.5 mg/dL — ABNORMAL LOW (ref 8.9–10.3)
Chloride: 102 mmol/L (ref 98–111)
Creatinine, Ser: 0.86 mg/dL (ref 0.61–1.24)
GFR, Estimated: >60 mL/min (ref >60)
Glucose, Bld: 241 mg/dL — ABNORMAL HIGH (ref 70–99)
Potassium: 3.5 mmol/L (ref 3.5–5.1)
Sodium: 134 mmol/L — ABNORMAL LOW (ref 135–145)

## 2023-08-05 LAB — CBC
HCT: 35 % — ABNORMAL LOW (ref 39.0–52.0)
Hemoglobin: 11.8 g/dL — ABNORMAL LOW (ref 13.0–17.0)
MCH: 26.9 pg (ref 26.0–34.0)
MCHC: 33.7 g/dL (ref 30.0–36.0)
MCV: 79.9 fL — ABNORMAL LOW (ref 80.0–100.0)
Platelets: 209 10*3/uL (ref 150–400)
RBC: 4.38 MIL/uL (ref 4.22–5.81)
RDW: 14.6 % (ref 11.5–15.5)
WBC: 9 10*3/uL (ref 4.0–10.5)
nRBC: 0 % (ref 0.0–0.2)

## 2023-08-05 LAB — GLUCOSE, CAPILLARY
Glucose-Capillary: 227 mg/dL — ABNORMAL HIGH (ref 70–99)
Glucose-Capillary: 229 mg/dL — ABNORMAL HIGH (ref 70–99)
Glucose-Capillary: 132 mg/dL — ABNORMAL HIGH (ref 70–99)
Glucose-Capillary: 170 mg/dL — ABNORMAL HIGH (ref 70–99)

## 2023-08-05 LAB — SURGICAL PATHOLOGY

## 2023-08-05 MED ORDER — FAMOTIDINE 20 MG PO TABS: 20.0000 mg | ORAL_TABLET | Freq: Two times a day (BID) | ORAL | Status: DC

## 2023-08-05 NOTE — Plan of Care (Signed)
  Problem: Education: Goal: Knowledge of General Education information will improve Description: Including pain rating scale, medication(s)/side effects and non-pharmacologic comfort measures Outcome: Progressing   Problem: Health Behavior/Discharge Planning: Goal: Ability to manage health-related needs will improve Outcome: Progressing   Problem: Clinical Measurements: Goal: Ability to maintain clinical measurements within normal limits will improve Outcome: Progressing Goal: Will remain free from infection Outcome: Progressing Goal: Diagnostic test results will improve Outcome: Progressing Goal: Respiratory complications will improve Outcome: Progressing Goal: Cardiovascular complication will be avoided Outcome: Progressing   Problem: Activity: Goal: Risk for activity intolerance will decrease Outcome: Progressing   Problem: Nutrition: Goal: Adequate nutrition will be maintained Outcome: Progressing   Problem: Coping: Goal: Level of anxiety will decrease Outcome: Progressing   Problem: Elimination: Goal: Will not experience complications related to bowel motility Outcome: Progressing Goal: Will not experience complications related to urinary retention Outcome: Progressing   Problem: Pain Management: Goal: General experience of comfort will improve Outcome: Progressing   Problem: Safety: Goal: Ability to remain free from injury will improve Outcome: Progressing   Problem: Skin Integrity: Goal: Risk for impaired skin integrity will decrease Outcome: Progressing   Problem: Education: Goal: Knowledge of prescribed regimen will improve Outcome: Progressing   Problem: Activity: Goal: Ability to tolerate increased activity will improve Outcome: Progressing   Problem: Bowel/Gastric: Goal: Gastrointestinal status for postoperative course will improve Outcome: Progressing   Problem: Clinical Measurements: Goal: Postoperative complications will be avoided or  minimized Outcome: Progressing Goal: Signs and symptoms of graft occlusion will improve Outcome: Progressing   Problem: Skin Integrity: Goal: Demonstration of wound healing without infection will improve Outcome: Progressing   Problem: Education: Goal: Ability to describe self-care measures that may prevent or decrease complications (Diabetes Survival Skills Education) will improve Outcome: Progressing Goal: Individualized Educational Video(s) Outcome: Progressing   Problem: Coping: Goal: Ability to adjust to condition or change in health will improve Outcome: Progressing   Problem: Fluid Volume: Goal: Ability to maintain a balanced intake and output will improve Outcome: Progressing   Problem: Health Behavior/Discharge Planning: Goal: Ability to identify and utilize available resources and services will improve Outcome: Progressing Goal: Ability to manage health-related needs will improve Outcome: Progressing   Problem: Metabolic: Goal: Ability to maintain appropriate glucose levels will improve Outcome: Progressing   Problem: Nutritional: Goal: Maintenance of adequate nutrition will improve Outcome: Progressing Goal: Progress toward achieving an optimal weight will improve Outcome: Progressing   Problem: Skin Integrity: Goal: Risk for impaired skin integrity will decrease Outcome: Progressing   Problem: Tissue Perfusion: Goal: Adequacy of tissue perfusion will improve Outcome: Progressing   Problem: Education: Goal: Knowledge of prescribed regimen will improve Outcome: Progressing   Problem: Activity: Goal: Ability to tolerate increased activity will improve Outcome: Progressing   Problem: Bowel/Gastric: Goal: Gastrointestinal status for postoperative course will improve Outcome: Progressing   Problem: Clinical Measurements: Goal: Postoperative complications will be avoided or minimized Outcome: Progressing Goal: Signs and symptoms of graft occlusion  will improve Outcome: Progressing   Problem: Skin Integrity: Goal: Demonstration of wound healing without infection will improve Outcome: Progressing

## 2023-08-05 NOTE — Plan of Care (Signed)
  Problem: Education: Goal: Knowledge of General Education information will improve Description: Including pain rating scale, medication(s)/side effects and non-pharmacologic comfort measures Outcome: Progressing   Problem: Activity: Goal: Risk for activity intolerance will decrease Outcome: Progressing   Problem: Nutrition: Goal: Adequate nutrition will be maintained Outcome: Progressing   Problem: Coping: Goal: Level of anxiety will decrease Outcome: Progressing   Problem: Elimination: Goal: Will not experience complications related to bowel motility Outcome: Progressing   Problem: Pain Management: Goal: General experience of comfort will improve Outcome: Progressing   Problem: Safety: Goal: Ability to remain free from injury will improve Outcome: Progressing   Problem: Skin Integrity: Goal: Risk for impaired skin integrity will decrease Outcome: Progressing   Problem: Activity: Goal: Ability to tolerate increased activity will improve Outcome: Progressing   Problem: Skin Integrity: Goal: Demonstration of wound healing without infection will improve Outcome: Progressing   Problem: Education: Goal: Ability to describe self-care measures that may prevent or decrease complications (Diabetes Survival Skills Education) will improve Outcome: Progressing   Problem: Nutritional: Goal: Maintenance of adequate nutrition will improve Outcome: Progressing   Problem: Skin Integrity: Goal: Risk for impaired skin integrity will decrease Outcome: Progressing   Problem: Tissue Perfusion: Goal: Adequacy of tissue perfusion will improve Outcome: Progressing

## 2023-08-05 NOTE — Progress Notes (Signed)
  Progress Note    08/05/2023 11:53 AM 1 Day Post-Op  Subjective:  Kevin Huerta is a 57 yo male who is now POD#1 from a left femoral endarterectomy with stent placement to the left SFA and 2 above the knee popliteal artery.  Patient is resting comfortably in bed this morning.  Prevena wound VAC to left groin is in place and working well.  Patient's left lower extremity is very warm this morning to touch.  Palpable pulses at the [facility] are present.  Patient does endorse pain to his left leg mostly in the groin.  He did endorse however ambulating in the room to use the bathroom last night.  No complaints overnight.  Patient recovering as expected.  Vitals all remained stable.   Vitals:   08/05/23 0900 08/05/23 1100  BP: (!) 168/70 137/79  Pulse: 75 72  Resp: 19 18  Temp:    SpO2: 95% 98%   Physical Exam: Cardiac:  RRR, Positive S2 Murmur Lungs:  Normal Respiratory effort, slight wheezing to left lung.  Incisions:  Left groin with prevena wound vac in place and working well.  Extremities: Bilateral lower extremities warm to touch.  Palpable pulses bilaterally. Abdomen: Positive bowel sounds throughout, soft, nontender and distended. Neurologic: AAOX4 answers questions and follows commands appropriately.  CBC    Component Value Date/Time   WBC 9.0 08/05/2023 0019   RBC 4.38 08/05/2023 0019   HGB 11.8 (L) 08/05/2023 0019   HCT 35.0 (L) 08/05/2023 0019   PLT 209 08/05/2023 0019   MCV 79.9 (L) 08/05/2023 0019   MCH 26.9 08/05/2023 0019   MCHC 33.7 08/05/2023 0019   RDW 14.6 08/05/2023 0019   LYMPHSABS 1.9 04/06/2023 1443   MONOABS 0.4 04/06/2023 1443   EOSABS 0.3 04/06/2023 1443   BASOSABS 0.0 04/06/2023 1443    BMET    Component Value Date/Time   NA 134 (L) 08/05/2023 0019   K 3.5 08/05/2023 0019   CL 102 08/05/2023 0019   CO2 22 08/05/2023 0019   GLUCOSE 241 (H) 08/05/2023 0019   BUN 14 08/05/2023 0019   CREATININE 0.86 08/05/2023 0019   CALCIUM 8.5 (L) 08/05/2023 0019    GFRNONAA >60 08/05/2023 0019   GFRAA >60 06/17/2018 1512    INR    Component Value Date/Time   INR 0.98 01/17/2010 1135     Intake/Output Summary (Last 24 hours) at 08/05/2023 1153 Last data filed at 08/05/2023 0800 Gross per 24 hour  Intake 2202.11 ml  Output 3050 ml  Net -847.89 ml     Assessment/Plan:  57 y.o. male is s/p  left femoral endarterectomy with stent placement to the left SFA and 2 above the knee popliteal artery.  1 Day Post-Op   PLAN: Transfer to Floor today PT/OT Eval Pain medication PRN  OOB to chair Q 8 Hrs Ambulate in the halls with assistance  Advance diet as tolerated.   DVT prophylaxis:  Plavix 75 mg Daily and Lovenox 40 mg Q 24 Hrs   Eufemia Prindle R Jihan [facility] 08/05/2023 11:53 AM

## 2023-08-05 NOTE — Evaluation (Signed)
Occupational Therapy Evaluation Patient Details Name: Kevin Huerta MRN: [id number] DOB: Jun 15, 1966 Today's Date: 08/05/2023   History of Present Illness Patient is a 57 year old male with Atherosclerotic occlusive disease with claudication s/p left femoral endarterectomy. History of hypertension, diabetes   Clinical Impression   Pt was seen for OT evaluation this date with PT cotx to optimize safety with initial mobility attempts. Prior to hospital admission, pt was independent. Pt's spouse present and can provide 24/7 assist if needed. Pt presents to acute OT demonstrating impaired ADL performance and functional mobility 2/2 L groin incisional pain increasing with hip flexion (See OT problem list for additional functional deficits). Pt currently requires CGA for bed mobility and ADL transfers. Pt requires PRN MIN A for LB ADL tasks 2/2 incisional pain. Pt/spouse educated in home/routines modifications, AE/DME, pain mgt, and falls prevention strategies. Pt/spouse verbalized understanding. No additional skilled OT needs at this time. Pt has all equipment recommended. Will sign off.       If plan is discharge home, recommend the following: A little help with bathing/dressing/bathroom;Assistance with cooking/housework;Assist for transportation;Help with stairs or ramp for entrance    Functional Status Assessment  Patient has had a recent decline in their functional status and demonstrates the ability to make significant improvements in function in a reasonable and predictable amount of time.  Equipment Recommendations  None recommended by OT    Recommendations for Other Services       Precautions / Restrictions Precautions Precautions: Fall Precaution Comments: wound vac L groin Restrictions Weight Bearing Restrictions: No      Mobility Bed Mobility Overal bed mobility: Needs Assistance Bed Mobility: Supine to Sit     Supine to sit: Contact guard          Transfers Overall  transfer level: Needs assistance Equipment used: Rolling walker (2 wheels) Transfers: Sit to/from Stand Sit to Stand: Contact guard assist           General transfer comment: verbal cues for hand placement. education provided for transfers from various surfaces at home as well.      Balance Overall balance assessment: Mild deficits observed, not formally tested                                         ADL either performed or assessed with clinical judgement   ADL                                         General ADL Comments: Pt requires MIN A for LB ADL tasks 2/2 increased pain/discomfort with groin incision site with hip flexion.     Vision         Perception         Praxis         Pertinent Vitals/Pain Pain Assessment Pain Assessment: 0-10 Pain Score: 1  Pain Location: groin Pain Descriptors / Indicators: Discomfort Pain Intervention(s): Limited activity within patient's tolerance, Monitored during session, Premedicated before session, Repositioned     Extremity/Trunk Assessment Upper Extremity Assessment Upper Extremity Assessment: Overall WFL for tasks assessed   Lower Extremity Assessment Lower Extremity Assessment: Overall WFL for tasks assessed;LLE deficits/detail LLE Deficits / Details: pain in the L groin with AROM and with hip in flexed position LLE Sensation: WNL  Communication Communication Communication: No apparent difficulties   Cognition Arousal: Alert Behavior During Therapy: WFL for tasks assessed/performed Overall Cognitive Status: Within Functional Limits for tasks assessed                                       General Comments  vitals stable throughout session    Exercises Other Exercises Other Exercises: Pt/spouse educated in home/routines modifications, AE/DME, pain mgt, and falls prevention strategies.   Shoulder Instructions      Home Living Family/patient expects to  be discharged to:: Private residence Living Arrangements: Spouse/significant other;Children Available Help at Discharge: Family;Available 24 hours/day Type of Home: House Home Access: Stairs to enter [employer] of Steps: 5 Entrance Stairs-Rails: Right Home Layout: Two level;Able to live on main level with bedroom/bathroom (office is upstairs) Alternate Level Stairs-Number of Steps: flight   Bathroom Shower/Tub: Walk-in shower   Bathroom Toilet: Handicapped height     Home Equipment: Teacher, English as a foreign language (2 wheels)   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      Prior Functioning/Environment Prior Level of Function : Independent/Modified Independent;Driving;Working/employed             Mobility Comments: independent without device          OT Problem List: Pain      OT Treatment/Interventions:      OT Goals(Current goals can be found in the care plan section) Acute Rehab OT Goals Patient Stated Goal: get better and go home OT Goal Formulation: All assessment and education complete, DC therapy  OT Frequency:      Co-evaluation PT/OT/SLP Co-Evaluation/Treatment: Yes Reason for Co-Treatment: Complexity of the patient's impairments (multi-system involvement) PT goals addressed during session: Mobility/safety with mobility OT goals addressed during session: ADL's and self-care      AM-PAC OT "6 Clicks" Daily Activity     Outcome Measure Help from another person eating meals?: None Help from another person taking care of personal grooming?: None Help from another person toileting, which includes using toliet, bedpan, or urinal?: None Help from another person bathing (including washing, rinsing, drying)?: A Little Help from another person to put on and taking off regular upper body clothing?: None Help from another person to put on and taking off regular lower body clothing?: A Little 6 Click Score: 22   End of Session Nurse  Communication: Mobility status  Activity Tolerance: Patient tolerated treatment well Patient left: in bed;with call bell/phone within reach;with family/visitor present;Other (comment) (seated EOB)  OT Visit Diagnosis: Other abnormalities of gait and mobility (R26.89);Pain Pain - Right/Left: Left Pain - part of body: Hip (groin)                Time: 0454-0981 OT Time Calculation (min): 23 min Charges:  OT General Charges $OT Visit: 1 Visit OT Evaluation $OT Eval Low Complexity: 1 Low OT Treatments $Self Care/Home Management : 8-22 mins  Arman Filter., MPH, MS, OTR/L ascom [phone] 08/05/23, 11:01 AM

## 2023-08-05 NOTE — Plan of Care (Signed)
  Problem: Education: Goal: Knowledge of General Education information will improve Description: Including pain rating scale, medication(s)/side effects and non-pharmacologic comfort measures Outcome: Progressing   Problem: Health Behavior/Discharge Planning: Goal: Ability to manage health-related needs will improve Outcome: Progressing   Problem: Clinical Measurements: Goal: Ability to maintain clinical measurements within normal limits will improve Outcome: Progressing Goal: Will remain free from infection Outcome: Progressing Goal: Respiratory complications will improve Outcome: Progressing Goal: Cardiovascular complication will be avoided Outcome: Progressing   Problem: Activity: Goal: Risk for activity intolerance will decrease Outcome: Progressing   Problem: Coping: Goal: Level of anxiety will decrease Outcome: Progressing   Problem: Pain Management: Goal: General experience of comfort will improve Outcome: Progressing   Problem: Safety: Goal: Ability to remain free from injury will improve Outcome: Progressing   Problem: Skin Integrity: Goal: Risk for impaired skin integrity will decrease Outcome: Progressing

## 2023-08-05 NOTE — Evaluation (Signed)
Physical Therapy Evaluation Patient Details Name: Kevin Huerta MRN: [id number] DOB: Apr 26, 1966 Today's Date: 08/05/2023  History of Present Illness  Patient is a 57 year old male with Atherosclerotic occlusive disease with claudication s/p left femoral endarterectomy. History of hypertension, diabetes  Clinical Impression  Patient is agreeable to PT evaluation completed. Patient reported improved pain today after pain medication earlier. He is typically independent with mobility and lives with spouse.  Today, the patient was able to get out of bed with CGA. Education for transfers provided. Patient ambulated into the hallway using rolling walker with 2/10 pain reported in the left groin. Vitals were stable throughout session. Patient is hopeful to return home tomorrow. Recommend PT follow up maximize independence in preparation for home mobility. No PT needs anticipated after this hospital stay.       If plan is discharge home, recommend the following: Help with stairs or ramp for entrance     Equipment Recommendations None recommended by PT     Functional Status Assessment Patient has had a recent decline in their functional status and demonstrates the ability to make significant improvements in function in a reasonable and predictable amount of time.     Precautions / Restrictions Precautions Precautions: Fall Precaution Comments: wound vac L groin Restrictions Weight Bearing Restrictions: No      Mobility  Bed Mobility Overal bed mobility: Needs Assistance Bed Mobility: Supine to Sit     Supine to sit: Contact guard          Transfers Overall transfer level: Needs assistance Equipment used: Rolling walker (2 wheels) Transfers: Sit to/from Stand Sit to Stand: Contact guard assist           General transfer comment: verbal cues for hand placement. education provided for transfers from various surfaces at home as well.    Ambulation/Gait Ambulation/Gait  assistance: Contact guard assist Gait Distance (Feet): 60 Feet Assistive device: Rolling walker (2 wheels) Gait Pattern/deviations: Step-through pattern, Decreased stride length, Antalgic, Decreased stance time - left Gait velocity: decreased     General Gait Details: mild decreased stance time on the L leg. no loss of balance using rolling walker for support which is recommended for now for safety with standing/ambulation. reinforcement of proper positioning of rolling walker and to avoid excessinve trunk flexion  Stairs            Wheelchair Mobility     Tilt Bed    Modified Rankin (Stroke Patients Only)       Balance Overall balance assessment: Mild deficits observed, not formally tested                                           Pertinent Vitals/Pain Pain Assessment Pain Assessment: 0-10 Pain Score: 1  (1 at rest 2 with mobility) Pain Location: groin Pain Descriptors / Indicators: Discomfort Pain Intervention(s): Limited activity within patient's tolerance, Monitored during session, Premedicated before session    Home Living Family/patient expects to be discharged to:: Private residence Living Arrangements: Spouse/significant other;Children Available Help at Discharge: Family;Available 24 hours/day Type of Home: House Home Access: Stairs to enter Entrance Stairs-Rails: Right Entrance Stairs-Number of Steps: 5 Alternate Level Stairs-Number of Steps: flight Home Layout: Two level;Able to live on main level with bedroom/bathroom (office is upstairs) Home Equipment: BSC/3in1;Rolling Walker (2 wheels) 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    Prior Function  Prior Level of Function : Independent/Modified Independent;Driving;Working/employed             Mobility Comments: independent without device       Extremity/Trunk Assessment   Upper Extremity Assessment Upper Extremity Assessment: Overall WFL for tasks  assessed    Lower Extremity Assessment Lower Extremity Assessment: Overall WFL for tasks assessed;LLE deficits/detail LLE Deficits / Details: pain in the L groin with AROM and with hip in flexed position LLE Sensation: WNL       Communication   Communication Communication: No apparent difficulties  Cognition Arousal: Alert Behavior During Therapy: WFL for tasks assessed/performed Overall Cognitive Status: Within Functional Limits for tasks assessed                                          General Comments General comments (skin integrity, edema, etc.): vitals stable throughout session    Exercises     Assessment/Plan    PT Assessment Patient needs continued PT services  PT Problem List Decreased range of motion;Decreased activity tolerance;Decreased balance;Decreased mobility;Decreased safety awareness;Pain       PT Treatment Interventions DME instruction;Gait training;Stair training;Functional mobility training;Therapeutic activities;Therapeutic exercise;Balance training    PT Goals (Current goals can be found in the Care Plan section)  Acute Rehab PT Goals Patient Stated Goal: to go home PT Goal Formulation: With patient Time For Goal Achievement: 08/19/23 Potential to Achieve Goals: Good    Frequency Min 1X/week     Co-evaluation PT/OT/SLP Co-Evaluation/Treatment: Yes Reason for Co-Treatment: Complexity of the patient's impairments (multi-system involvement) PT goals addressed during session: Mobility/safety with mobility         AM-PAC PT "6 Clicks" Mobility  Outcome Measure Help needed turning from your back to your side while in a flat bed without using bedrails?: A Little Help needed moving from lying on your back to sitting on the side of a flat bed without using bedrails?: A Little Help needed moving to and from a bed to a chair (including a wheelchair)?: A Little Help needed standing up from a chair using your arms (e.g., wheelchair  or bedside chair)?: A Little Help needed to walk in hospital room?: A Little Help needed climbing 3-5 steps with a railing? : A Little 6 Click Score: 18    End of Session   Activity Tolerance: Patient tolerated treatment well Patient left: in bed;with call bell/phone within reach;with family/visitor present (seated on edge of bed per patient preference) Nurse Communication: Mobility status PT Visit Diagnosis: Other abnormalities of gait and mobility (R26.89);Difficulty in walking, not elsewhere classified (R26.2)    Time: 7846-9629 PT Time Calculation (min) (ACUTE ONLY): 23 min   Charges:   PT Evaluation $PT Eval Low Complexity: 1 Low PT Treatments $Therapeutic Activity: 8-22 mins PT General Charges $$ ACUTE PT VISIT: 1 Visit         Donna Bernard, PT, MPT   Ina Homes 08/05/2023, 10:32 AM

## 2023-08-06 LAB — GLUCOSE, CAPILLARY
Glucose-Capillary: 151 mg/dL — ABNORMAL HIGH (ref 70–99)
Glucose-Capillary: 199 mg/dL — ABNORMAL HIGH (ref 70–99)

## 2023-08-06 MED ORDER — OXYCODONE-ACETAMINOPHEN 5-325 MG PO TABS: 1.0000 | ORAL_TABLET | ORAL | 0 refills | Status: DC | PRN

## 2023-08-06 MED ORDER — LIVING WELL WITH DIABETES BOOK
Freq: Once | Status: AC
  Filled 2023-08-06: qty 1

## 2023-08-06 MED ORDER — CLOPIDOGREL BISULFATE 75 MG PO TABS: 75.0000 mg | ORAL_TABLET | Freq: Every day | ORAL | 4 refills | Status: AC

## 2023-08-06 NOTE — Progress Notes (Signed)
Introduced patient and wife, at bedside, to role of nurse navigator. Patient has current PCP and Endocrinologist. Patient lives at home with his wife, Verlon Au, and his youngest daughter Scientist, clinical (histocompatibility and immunogenetics). Denies any unmet SDOH needs at present. Able to drive to appointments.   Hopeful for discharge home today. Requested message be sent to Rolla Plate, NP. Notified.

## 2023-08-06 NOTE — Discharge Summary (Signed)
[facility]    Discharge Summary    Patient ID:  Kevin Huerta MRN: [id number] DOB/AGE: Jul 31, 1966 57 y.o.  Admit date: 08/04/2023 Discharge date: 08/06/2023 Date of Surgery: 08/04/2023 Surgeon: Surgeon(s): Dew, Marlow Baars, MD Schnier, Latina Craver, MD  Admission Diagnosis: Atherosclerosis of artery of extremity with rest pain [facility]) [I70.229]  Discharge Diagnoses:  Atherosclerosis of artery of extremity with rest pain [facility]) [I70.229]  Secondary Diagnoses: Past Medical History:  Diagnosis Date   Allergic rhinitis, seasonal    Anginal equivalent (HCC)    Anxiety    a.) on BZO (alprazolam) PRN   Aortic atherosclerosis (HCC)    Arthritis    Asthma    Atherosclerosis of native artery of both lower extremities with intermittent claudication (HCC)    a.) s/p PTA of RIGHT prox peroneal, prox SFA, popliteal, and distal SFA; 6 mm x 15 cm Viabahn stent to RIGHT distal SFA and above knee popliteal artery; b.) s/p unsuccessful PTA of occluded SFA 03/22/2023 --> plans for endarterectomy +/-  endoluminal intervention   BRBPR (bright red blood per rectum) 05/24/2020   Chest heaviness 10/27/2022   Coronary artery disease of native artery of native heart with stable angina pectoris (HCC) 03/29/2023   a.) cCTA 03/29/2023: Ca2+ 476 (95th percentile for age/sex/race matched control)   Current vaping on some days    DDD (degenerative disc disease), cervical    a.) s/p ACDF C3-C5 2014   Dermatitis 06/02/2021   Diarrhea 02/26/2021   Emphysema lung (HCC)    Fatty liver 09/03/2020   GERD (gastroesophageal reflux disease)    Gout    Heart murmur    History of cardiac catheterization 11/27/2008   a.) LHC 11/27/2008 at [facility] --> normal coronary anatomy with no obstructive CAD   Hyperlipidemia    Hypersomnia 07/17/2014   Hypertension    Insomnia    a.) uses melatonin +/- trazodone PRN   Leg cramps 02/26/2021   Marijuana use, episodic    Occasional  alcohol consumption    On long term clopidogrel therapy    OSA (obstructive sleep apnea)    a.) not currently on nocturnal PAP therapy; needs repeat PSG   Seasonal allergies    T2DM (type 2 diabetes mellitus) (HCC)    a.) monitors with Freestyle Libre CGM   Tinnitus     Procedure(s): ENDARTERECTOMY FEMORAL (SFA STENT PLACEMENT) APPLICATION OF CELL SAVER  Discharged Condition: good  HPI:  Kysean Sweet is a 57 yo male who is now status postop day 2 from left femoral endarterectomy with stent placement.  Patient is resting comfortably in bed today.  Patient has left groin Prevena wound VAC in place and working well.  No signs or symptoms of infection hematoma or seroma from the incision site.  Patient is using Tylenol for pain today.  Patient endorses ambulating around the halls and back and forth to the bathroom today.  Patient endorses eating well and urinating well.  No complaints today.  Vitals are remained stable.  Patient request to go home and will be discharged this afternoon.  Patient to be discharged home on Plavix 75 mg daily and Lipitor 80 mg daily.  Patient is allergic to aspirin and therefore will not be placed on aspirin for dual anticoagulation platelet therapy.  Patient does not require further anticoagulation with either Eliquis or Xarelto at this time.  Hospital Course:  Kevin Huerta is a 57 y.o. male is S/P Left femoral endarterectomy with stent placement. Extubated: POD #  0 Physical Exam:  Alert notes x3, no acute distress Face: Symmetrical.  Tongue is midline. Neck: Trachea is midline.  No swelling or bruising. Cardiovascular: Regular rate and rhythm Pulmonary: Clear to auscultation bilaterally Abdomen: Soft, nontender, nondistended Right groin access: Clean dry and intact.  No swelling or drainage noted Left groin access: Clean dry and intact.  No swelling or drainage noted Left lower extremity: Thigh soft.  Calf soft.  Extremities warm distally toes. Hard to  palpate pedal pulses however the foot is warm is her good capillary refill. Right lower extremity: Thigh soft.  Calf soft.  Extremities warm distally toes. Hard to palpate pedal pulses however the foot is warm is her good capillary refill. Neurological: No deficits noted   Post-op wounds:  clean, dry, intact or healing well  Pt. Ambulating, voiding and taking PO diet without difficulty. Pt pain controlled with PO pain meds.  Labs:  As below  Complications: none  Consults:    Significant Diagnostic Studies: CBC Lab Results  Component Value Date   WBC 9.0 08/05/2023   HGB 11.8 (L) 08/05/2023   HCT 35.0 (L) 08/05/2023   MCV 79.9 (L) 08/05/2023   PLT 209 08/05/2023    BMET    Component Value Date/Time   NA 134 (L) 08/05/2023 0019   K 3.5 08/05/2023 0019   CL 102 08/05/2023 0019   CO2 22 08/05/2023 0019   GLUCOSE 241 (H) 08/05/2023 0019   BUN 14 08/05/2023 0019   CREATININE 0.86 08/05/2023 0019   CALCIUM 8.5 (L) 08/05/2023 0019   GFRNONAA >60 08/05/2023 0019   GFRAA >60 06/17/2018 1512   COAG Lab Results  Component Value Date   INR 0.98 01/17/2010     Disposition:  Discharge to :Home  Allergies as of 08/06/2023       Reactions   Aspirin Tinitus        Medication List     TAKE these medications    albuterol 108 (90 Base) MCG/ACT inhaler Commonly known as: VENTOLIN HFA USE 2 INHALATIONS EVERY 6 HOURS AS NEEDED FOR WHEEZING OR SHORTNESS OF BREATH   allopurinol 100 MG tablet Commonly known as: ZYLOPRIM Take 200 mg by mouth daily.   ALPRAZolam 0.5 MG tablet Commonly known as: XANAX Take 0.5 mg by mouth as needed.   Arnuity Ellipta 100 MCG/ACT Aepb Generic drug: Fluticasone Furoate Inhale 1 puff into the lungs daily in the afternoon.   atorvastatin 80 MG tablet Commonly known as: LIPITOR TAKE 1 TABLET BY MOUTH EVERY DAY   cetirizine 10 MG tablet Commonly known as: ZYRTEC Take 10 mg by mouth 2 (two) times daily.   clopidogrel 75 MG  tablet Commonly known as: Plavix Take 1 tablet (75 mg total) by mouth daily. Start taking on: August 07, 2023   clotrimazole-betamethasone cream Commonly known as: LOTRISONE APPLY 1 APPLICATION TOPICALLY DAILY   colchicine 0.6 MG tablet Take 0.6 mg by mouth 2 (two) times daily as needed.   fluticasone 50 MCG/ACT nasal spray Commonly known as: FLONASE Place 2 sprays into both nostrils daily. What changed:  when to take this reasons to take this   FreeStyle Libre 2 Sensor Misc USE TO CHECK GLUCOSE THREE TIMES A DAY   glipiZIDE 10 MG 24 hr tablet Commonly known as: GLUCOTROL XL Take 10 mg by mouth.   HYDROcodone-acetaminophen 5-325 MG tablet Commonly known as: NORCO/VICODIN Take 1 tablet by mouth every 4 (four) hours as needed for moderate pain.   ipratropium 0.06 % nasal spray  Commonly known as: ATROVENT Place 2 sprays into both nostrils 4 (four) times daily.   ketoconazole 2 % cream Commonly known as: NIZORAL APPLY TOPICALLY TWICE A DAY AS NEEDED   Melatonin 10 MG Caps Take 10 mg by mouth every evening.   Multi-Vitamin tablet Take 1 tablet by mouth daily.   omeprazole 40 MG capsule Commonly known as: PRILOSEC TAKE 1 CAPSULE (40 MG TOTAL) BY MOUTH IN THE MORNING AND AT BEDTIME.   OneTouch Delica Lancets 33G Misc by Does not apply route.   OneTouch Verio Flex System w/Device Kit by Does not apply route.   OneTouch Verio test strip Generic drug: glucose blood   OVER THE COUNTER MEDICATION Take 600 mg by mouth daily. Goli Ashwagandha Gummies   oxyCODONE-acetaminophen 5-325 MG tablet Commonly known as: PERCOCET/ROXICET Take 1-2 tablets by mouth every 4 (four) hours as needed for moderate pain (pain score 4-6).   Ozempic (2 MG/DOSE) 8 MG/3ML Sopn Generic drug: Semaglutide (2 MG/DOSE) Inject 2 mg into the skin once a week.   pioglitazone 30 MG tablet Commonly known as: ACTOS Take 30 mg by mouth daily.   PROBIOTIC DAILY PO Take 1 capsule by mouth  daily.   Synjardy XR 12.02-999 MG Tb24 Generic drug: Empagliflozin-metFORMIN HCl ER Take 2 tablets by mouth daily before breakfast.   traZODone 100 MG tablet Commonly known as: DESYREL Take 100 mg by mouth at bedtime.   valsartan 320 MG tablet Commonly known as: DIOVAN TAKE 1 TABLET BY MOUTH EVERY DAY       Verbal and written Discharge instructions given to the patient. Wound care per Discharge AVS  Follow-up Information     Dana Allan, MD Follow up.   Specialty: Family Medicine Why: Hospital follow up Contact information: [address] [place] [address] [phone]         Annice Needy, MD Follow up in 3 week(s).   Specialties: Vascular Surgery, Radiology, Interventional Cardiology Why: Left lower extremity Ultrasound with ABI Contact information: [address] [place] [address] [phone]                 Signed: Marcie Bal, NP  08/06/2023, 1:47 PM

## 2023-08-06 NOTE — Discharge Instructions (Addendum)
Do not lift anything heavy. Do not lift anything heavier than a gallon of milk.    You may shower next Wednesday postop day 7.  Please do a sink bath/sponge bath until that time.  Please shower with the wound VAC to your left groin in place on day 7.  Immediately remove after showering and pat dry the incision line.  Keep clean and dry on a daily basis.  Do not cover the incision line with any creams or ointments or antibiotic ointment.  Do not drive until review return to clinic in 3 weeks to have your staples or sutures removed.  You will be discharged on 75 mg of Plavix daily and Lipitor 80 mg daily.  Please do not miss or skip any dosages of these medications as it is critical to prevent occlusion or clotting after surgery.  Follow-up with vein and vascular clinic as scheduled.

## 2023-08-06 NOTE — Plan of Care (Signed)
  Problem: Education: Goal: Knowledge of General Education information will improve Description: Including pain rating scale, medication(s)/side effects and non-pharmacologic comfort measures Outcome: Progressing   Problem: Health Behavior/Discharge Planning: Goal: Ability to manage health-related needs will improve Outcome: Progressing   Problem: Clinical Measurements: Goal: Ability to maintain clinical measurements within normal limits will improve Outcome: Progressing Goal: Will remain free from infection Outcome: Progressing Goal: Diagnostic test results will improve Outcome: Progressing Goal: Respiratory complications will improve Outcome: Progressing Goal: Cardiovascular complication will be avoided Outcome: Progressing   Problem: Activity: Goal: Risk for activity intolerance will decrease Outcome: Progressing   Problem: Nutrition: Goal: Adequate nutrition will be maintained Outcome: Progressing   Problem: Coping: Goal: Level of anxiety will decrease Outcome: Progressing   Problem: Elimination: Goal: Will not experience complications related to bowel motility Outcome: Progressing Goal: Will not experience complications related to urinary retention Outcome: Progressing   Problem: Pain Management: Goal: General experience of comfort will improve Outcome: Progressing   Problem: Safety: Goal: Ability to remain free from injury will improve Outcome: Progressing   Problem: Skin Integrity: Goal: Risk for impaired skin integrity will decrease Outcome: Progressing   Problem: Education: Goal: Knowledge of prescribed regimen will improve Outcome: Progressing   Problem: Activity: Goal: Ability to tolerate increased activity will improve Outcome: Progressing   Problem: Bowel/Gastric: Goal: Gastrointestinal status for postoperative course will improve Outcome: Progressing   Problem: Clinical Measurements: Goal: Postoperative complications will be avoided or  minimized Outcome: Progressing Goal: Signs and symptoms of graft occlusion will improve Outcome: Progressing   Problem: Skin Integrity: Goal: Demonstration of wound healing without infection will improve Outcome: Progressing   Problem: Education: Goal: Ability to describe self-care measures that may prevent or decrease complications (Diabetes Survival Skills Education) will improve Outcome: Progressing Goal: Individualized Educational Video(s) Outcome: Progressing   Problem: Coping: Goal: Ability to adjust to condition or change in health will improve Outcome: Progressing   Problem: Fluid Volume: Goal: Ability to maintain a balanced intake and output will improve Outcome: Progressing   Problem: Health Behavior/Discharge Planning: Goal: Ability to identify and utilize available resources and services will improve Outcome: Progressing Goal: Ability to manage health-related needs will improve Outcome: Progressing   Problem: Metabolic: Goal: Ability to maintain appropriate glucose levels will improve Outcome: Progressing   Problem: Nutritional: Goal: Maintenance of adequate nutrition will improve Outcome: Progressing Goal: Progress toward achieving an optimal weight will improve Outcome: Progressing   Problem: Skin Integrity: Goal: Risk for impaired skin integrity will decrease Outcome: Progressing   Problem: Tissue Perfusion: Goal: Adequacy of tissue perfusion will improve Outcome: Progressing   Problem: Education: Goal: Knowledge of prescribed regimen will improve Outcome: Progressing   Problem: Activity: Goal: Ability to tolerate increased activity will improve Outcome: Progressing   Problem: Bowel/Gastric: Goal: Gastrointestinal status for postoperative course will improve Outcome: Progressing   Problem: Clinical Measurements: Goal: Postoperative complications will be avoided or minimized Outcome: Progressing Goal: Signs and symptoms of graft occlusion  will improve Outcome: Progressing   Problem: Skin Integrity: Goal: Demonstration of wound healing without infection will improve Outcome: Progressing

## 2023-08-06 NOTE — Progress Notes (Signed)
Physical Therapy Treatment Patient Details Name: Kevin Huerta MRN: [id number] DOB: 1965/12/23 Today's Date: 08/06/2023   History of Present Illness Patient is a 57 year old male with Atherosclerotic occlusive disease with claudication s/p left femoral endarterectomy. History of hypertension, diabetes    PT Comments  Pt alert and oriented t/o session and receptive to therapy. Reporting 2/10 L groin pain upon entry, increased to 3/10 with activity. Pt mod I for bed mobility, supervision for STS and amb with RW, and CGA for stair negotiation. No LOB noted, increased effort/time with mobility 2/2 L groin pain. Wound vac intact pre/post session. No PT needs anticipated upon d/c.    If plan is discharge home, recommend the following: Help with stairs or ramp for entrance   Can travel by private vehicle      yes  Equipment Recommendations  None recommended by PT    Recommendations for Other Services       Precautions / Restrictions Precautions Precautions: Fall Precaution Comments: wound vac L groin Restrictions Weight Bearing Restrictions: No     Mobility  Bed Mobility Overal bed mobility: Modified Independent Bed Mobility: Supine to Sit, Sit to Supine     Supine to sit: Modified independent (Device/Increase time), Used rails Sit to supine: Modified independent (Device/Increase time), Used rails   General bed mobility comments: increased effort to mobilize L leg    Transfers Overall transfer level: Needs assistance Equipment used: Rolling walker (2 wheels) Transfers: Sit to/from Stand Sit to Stand: Supervision                Ambulation/Gait Ambulation/Gait assistance: Supervision Gait Distance (Feet): 100 Feet Assistive device: Rolling walker (2 wheels) Gait Pattern/deviations: Step-to pattern, Antalgic Gait velocity: decreased     General Gait Details: slightly antalgic on the L, no LOB noted, no rest breaks required   Stairs Stairs: Yes Stairs  assistance: Contact guard assist Stair Management: One rail Right Number of Stairs: 4 General stair comments: CGA 2/2 1 handrail at home; no LOB noted; step-to pattern   Wheelchair Mobility     Tilt Bed    Modified Rankin (Stroke Patients Only)       Balance Overall balance assessment: Needs assistance Sitting-balance support: Feet supported, No upper extremity supported Sitting balance-Leahy Scale: Normal     Standing balance support: Bilateral upper extremity supported, During functional activity Standing balance-Leahy Scale: Good                              Cognition Arousal: Alert Behavior During Therapy: WFL for tasks assessed/performed Overall Cognitive Status: Within Functional Limits for tasks assessed                                          Exercises      General Comments        Pertinent Vitals/Pain Pain Assessment Pain Assessment: 0-10 Pain Score: 2  Pain Location: L groin Pain Descriptors / Indicators: Discomfort Pain Intervention(s): Limited activity within patient's tolerance, Repositioned, Monitored during session, Premedicated before session, Ice applied    Home Living                          Prior Function            PT Goals (current goals can now be found in  the care plan section) Acute Rehab PT Goals Patient Stated Goal: to go home PT Goal Formulation: With patient Time For Goal Achievement: 08/19/23 Potential to Achieve Goals: Good Progress towards PT goals: Progressing toward goals    Frequency    Min 1X/week      PT Plan      Co-evaluation              AM-PAC PT "6 Clicks" Mobility   Outcome Measure  Help needed turning from your back to your side while in a flat bed without using bedrails?: A Little Help needed moving from lying on your back to sitting on the side of a flat bed without using bedrails?: A Little Help needed moving to and from a bed to a chair  (including a wheelchair)?: A Little Help needed standing up from a chair using your arms (e.g., wheelchair or bedside chair)?: A Little Help needed to walk in hospital room?: A Little Help needed climbing 3-5 steps with a railing? : A Little 6 Click Score: 18    End of Session Equipment Utilized During Treatment: Gait belt Activity Tolerance: Patient tolerated treatment well Patient left: in bed;with call bell/phone within reach;with family/visitor present Nurse Communication: Mobility status PT Visit Diagnosis: Other abnormalities of gait and mobility (R26.89);Difficulty in walking, not elsewhere classified (R26.2)     Time: 0922-0939 PT Time Calculation (min) (ACUTE ONLY): 17 min  Charges:    $Gait Training: 8-22 mins PT General Charges $$ ACUTE PT VISIT: 1 Visit                       Shauna Hugh, SPT 08/06/2023, 1:08 PM

## 2023-08-06 NOTE — Inpatient Diabetes Management (Signed)
Inpatient Diabetes Program Recommendations  AACE/ADA: New Consensus Statement on Inpatient Glycemic Control (2015)  Target Ranges:  Prepandial:   less than 140 mg/dL      Peak postprandial:   less than 180 mg/dL (1-2 hours)      Critically ill patients:  140 - 180 mg/dL   Lab Results  Component Value Date   GLUCAP 151 (H) 08/06/2023   HGBA1C 9.1 (H) 08/04/2023    Review of Glycemic Control  Latest Reference Range & Units 08/05/23 07:31 08/05/23 13:22 08/05/23 16:18 08/05/23 20:43 08/06/23 08:04  Glucose-Capillary 70 - 99 mg/dL 409 (H) 811 (H) 914 (H) 170 (H) 151 (H)  (H): Data is abnormally high  Diabetes history: DM2 Outpatient Diabetes medications:  Actos 30 mg QD Glipizide 10 XL QAM Synjardy 12.02-999 mg (2 tablets QAM) Ozempic weekly Current orders for Inpatient glycemic control:  Jardiance 25 mg every day Glipizide XL 10 mg QAM Novolog 0-15 units TID & 0-5 units QHS  Spoke with patient at bedside regarding A1C of 9.1%.  He states he was told to stop is Ozempic 2 weeks prior to surgery and then his surgwery was delayed another 2 weeks.  He has been off of Ozempic x 1 month.  During that month he gained 15 lbs and saw a notable difference in his diet.  He wears a [employer] 2 CGM.  He started back on his Ozempic yesterday per patient.  Hopefully this will help him with his glucose control.  He is current with endocrinologist, Dr. Gershon Crane.  Patient states his A1C is usually around 7%.  Ordered the Living Well with Diabetes booklet.    Will continue to follow while inpatient.  Thank you, Dulce Sellar, MSN, CDCES Diabetes Coordinator Inpatient Diabetes Program [phone] (team pager from 8a-5p)

## 2023-08-09 ENCOUNTER — Telehealth (INDEPENDENT_AMBULATORY_CARE_PROVIDER_SITE_OTHER): Payer: Self-pay

## 2023-08-09 NOTE — Telephone Encounter (Signed)
Patient's spouse called stating the wound vac that was to stay on for a week stopped this morning. Per Sheppard Plumber NP that is fine, she should clean with Betadine and cover with dry gauze. Patient's wife was informed of the recommendations from Sheppard Plumber NP.

## 2023-08-11 ENCOUNTER — Telehealth (INDEPENDENT_AMBULATORY_CARE_PROVIDER_SITE_OTHER): Payer: Self-pay

## 2023-08-11 NOTE — Telephone Encounter (Signed)
Spoke with the patient's spouse and gave her the recommendation from Dr. Wyn Quaker and transferred the spouse to make an appt to bring the patient in to be seen. See notes below.

## 2023-08-11 NOTE — Telephone Encounter (Signed)
Patient's spouse called in stating the patient is feeling nauseous when he goes from lying down to sitting up, patient feels as if his balance is off and he is sweating through his clothing at night while sleep. Patient's wife stated his BP, BS and Temp are normal and he is in a lot of pain. The spouse stated he mostly takes his Oxycodone at night and during the day uses the Tylenol more. The spouse also stated that when the wound vac came off he bled a little but she applied pressure and it stopped. Please advise.

## 2023-08-12 ENCOUNTER — Telehealth (INDEPENDENT_AMBULATORY_CARE_PROVIDER_SITE_OTHER): Payer: Self-pay | Admitting: Vascular Surgery

## 2023-08-12 NOTE — Telephone Encounter (Signed)
Spoke with pt's wife again and patient is scheduled with [place] for 11.20.24 with an ABI.

## 2023-08-12 NOTE — Telephone Encounter (Signed)
I saw in his chart that per discharge patient should follow up with Kevin Huerta/[place] NP in 3 weeks with left lower extremity ultrasound with ABI

## 2023-08-12 NOTE — Telephone Encounter (Signed)
Per previous telephone encounter, I called pt's spouse to schedule patient. The only day I had available was Tuesday 11.11.24 with Sheppard Plumber, NP. Patient's spouse states that Tuesday will not work as she has meetings that she can't get out of. She would like to know if it is ok to come in the next week. Please advise.

## 2023-08-13 ENCOUNTER — Telehealth (INDEPENDENT_AMBULATORY_CARE_PROVIDER_SITE_OTHER): Payer: Self-pay

## 2023-08-13 NOTE — Telephone Encounter (Signed)
Patient's spouse called stating that the patient is having white drainage from his incision. Spouse stated the patient called to let her know this. Patient was offered 08/17/23 appt to be seen and she was unable to bring him in as that is also the time she has a meeting. Please advise.

## 2023-08-13 NOTE — Telephone Encounter (Signed)
Patient's spouse was given the recommendation from Dr. Wyn Quaker. She asked if she could find out if there were any cancellations before 08/25/23. The spouse was transferred to the front desk. See notes below.

## 2023-08-15 ENCOUNTER — Ambulatory Visit
Admission: RE | Admit: 2023-08-15 | Discharge: 2023-08-15 | Disposition: A | Discharge status: Home / Self Care | Payer: 59 | Source: Ambulatory Visit | Attending: Emergency Medicine | Admitting: Emergency Medicine

## 2023-08-15 VITALS — BP 161/82 | HR 90 | Temp 99.7°F | Resp 18 | Ht 70.0 in | Wt 250.0 lb

## 2023-08-15 DIAGNOSIS — S31109A Unspecified open wound of abdominal wall, unspecified quadrant without penetration into peritoneal cavity, initial encounter: Secondary | ICD-10-CM | POA: Diagnosis not present

## 2023-08-15 MED ORDER — DEXAMETHASONE SODIUM PHOSPHATE 10 MG/ML IJ SOLN
10.0000 mg | Freq: Once | INTRAMUSCULAR | Status: AC
  Administered 2023-08-15: 10 mg via INTRAMUSCULAR

## 2023-08-15 MED ORDER — OXYCODONE-ACETAMINOPHEN 5-325 MG PO TABS: 1.0000 | ORAL_TABLET | ORAL | 0 refills | Status: DC | PRN

## 2023-08-15 MED ORDER — PREDNISONE 20 MG PO TABS: 40.0000 mg | ORAL_TABLET | Freq: Every day | ORAL | 0 refills | Status: DC

## 2023-08-15 MED ORDER — DOXYCYCLINE HYCLATE 100 MG PO CAPS: 100.0000 mg | ORAL_CAPSULE | Freq: Two times a day (BID) | ORAL | 0 refills | Status: DC

## 2023-08-15 NOTE — Discharge Instructions (Addendum)
Your symptoms are consistent with infection  Begin doxycycline every morning and every evening for 10 days  You were given a injection of steroids today to help reduce inflammation and pain, starting tomorrow to prednisone every morning with food for 5 days  May continue use of pain medicine every 4 hours as needed  Cleanse once a day with unscented soap and water, pat and do not rub then cover with a nonstick Band-Aid to keep area clear and prevent friction from clothing  You may take Tylenol every 6 hours as needed for pain  Please complete follow-up appointment with Doctor Who completed surgery for reevaluation after completion of antibiotic

## 2023-08-15 NOTE — ED Triage Notes (Signed)
Patient/wife states patient had surgery Oct 30th, disposable wound vac stopped worked Nov 4th and they were instructed to remove and discard.  White pus from wound starting Nov 7th and patient endorses chills and pain in left leg

## 2023-08-15 NOTE — ED Provider Notes (Signed)
Kevin Huerta    CSN: [id number] Arrival date & time: 08/15/23  1201      History   Chief Complaint Chief Complaint  Patient presents with   Wound Check    Entered by patient    HPI Kevin Huerta is a 57 y.o. male.   Presents for evaluation of increased pain, Nichele Slawson puslike drainage and chills present for 3 days.  Pus noticed at the surgical incision that was completed on 08/04/2023 in which 2 stents were placed.  Initially had disposable wound VAC to the affected area which was stopped on 04/08/2023.  Has been cleansing during hygiene with soap and water.  Denies fever.  Past Medical History:  Diagnosis Date   Allergic rhinitis, seasonal    Anginal equivalent (HCC)    Anxiety    a.) on BZO (alprazolam) PRN   Aortic atherosclerosis (HCC)    Arthritis    Asthma    Atherosclerosis of native artery of both lower extremities with intermittent claudication (HCC)    a.) s/p PTA of RIGHT prox peroneal, prox SFA, popliteal, and distal SFA; 6 mm x 15 cm Viabahn stent to RIGHT distal SFA and above knee popliteal artery; b.) s/p unsuccessful PTA of occluded SFA 03/22/2023 --> plans for endarterectomy +/-  endoluminal intervention   BRBPR (bright red blood per rectum) 05/24/2020   Chest heaviness 10/27/2022   Coronary artery disease of native artery of native heart with stable angina pectoris (HCC) 03/29/2023   a.) cCTA 03/29/2023: Ca2+ 476 (95th percentile for age/sex/race matched control)   Current vaping on some days    DDD (degenerative disc disease), cervical    a.) s/p ACDF C3-C5 2014   Dermatitis 06/02/2021   Diarrhea 02/26/2021   Emphysema lung (HCC)    Fatty liver 09/03/2020   GERD (gastroesophageal reflux disease)    Gout    Heart murmur    History of cardiac catheterization 11/27/2008   a.) LHC 11/27/2008 at [facility] --> normal coronary anatomy with no obstructive CAD   Hyperlipidemia    Hypersomnia 07/17/2014   Hypertension    Insomnia    a.)  uses melatonin +/- trazodone PRN   Leg cramps 02/26/2021   Marijuana use, episodic    Occasional alcohol consumption    On long term clopidogrel therapy    OSA (obstructive sleep apnea)    a.) not currently on nocturnal PAP therapy; needs repeat PSG   Seasonal allergies    T2DM (type 2 diabetes mellitus) (HCC)    a.) monitors with Freestyle Libre CGM   Tinnitus     Patient Active Problem List   Diagnosis Date Noted   Atherosclerosis of artery of extremity with rest pain (HCC) 08/04/2023   Marijuana use, episodic 04/12/2023   Tinnitus aurium, left 11/27/2022   DOE (dyspnea on exertion) 10/27/2022   Bronchitis 06/26/2022   Chronic bilateral low back pain with right-sided sciatica 06/15/2022   Tinea 05/07/2022   Right wrist pain 02/26/2021   Pain due to onychomycosis of toenails of both feet 01/30/2021   Atherosclerosis of aorta (HCC) 11/07/2020   Coronary artery disease 11/07/2020   Emphysema lung (HCC) 11/07/2020   History of tobacco use 10/15/2020   Hyperlipidemia associated with type 2 diabetes mellitus (HCC) 10/15/2020   Insomnia 10/15/2020   Fatty liver 09/03/2020   Left wrist pain 01/29/2020   Chronic arthropathy 01/29/2020   Chronic gouty arthropathy 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 11/24/2017   OSA (obstructive sleep apnea) 07/17/2014   Snoring 07/17/2014  Cervical radiculopathy 01/22/2011   Hypertension associated with diabetes (HCC) 01/22/2011   Type 2 diabetes mellitus without complication, without long-term current use of insulin (HCC) 01/22/2011   Eczema 04/15/2010   Hyperlipidemia LDL goal <100 03/13/2010   Generalized anxiety disorder 12/17/2009   Allergic rhinitis 07/30/2009   Asthma 07/29/2009   Depression 03/31/2007   Gout 03/31/2007   Herpes simplex 03/31/2007    Past Surgical History:  Procedure Laterality Date   ANTERIOR CERVICAL DECOMP/DISCECTOMY FUSION N/A 2014   ENDARTERECTOMY FEMORAL Left 08/04/2023   Procedure: ENDARTERECTOMY FEMORAL (SFA STENT PLACEMENT);   Surgeon: Annice Needy, MD;  Location: [facility] ORS;  Service: Vascular;  Laterality: Left;   KNEE SURGERY Right    meniscus   LEFT HEART CATH AND CORONARY ANGIOGRAPHY Left 11/27/2008   Procedure: LEFT HEART CATH AND CORONARY ANGIOGRAPHY; Location: [facility]; Surgeon: Shari Prows, MD   LOWER EXTREMITY ANGIOGRAPHY Right 11/16/2022   Procedure: Lower Extremity Angiography;  Surgeon: Annice Needy, MD;  Location: [facility] INVASIVE CV LAB;  Service: Cardiovascular;  Laterality: Right;   LOWER EXTREMITY ANGIOGRAPHY Left 03/22/2023   Procedure: Lower Extremity Angiography;  Surgeon: Annice Needy, MD;  Location: [facility] INVASIVE CV LAB;  Service: Cardiovascular;  Laterality: Left;   ROTATOR CUFF REPAIR Right    WISDOM TOOTH EXTRACTION         Home Medications    Prior to Admission medications   Medication Sig Start Date End Date Taking? Authorizing Provider  doxycycline (VIBRAMYCIN) 100 MG capsule Take 1 capsule (100 mg total) by mouth 2 (two) times daily for 10 days. 08/15/23 08/25/23 Yes Livvy Spilman R, NP  predniSONE (DELTASONE) 20 MG tablet Take 2 tablets (40 mg total) by mouth daily. 08/15/23  Yes Eagan Shifflett R, NP  albuterol (VENTOLIN HFA) 108 (90 Base) MCG/ACT inhaler USE 2 INHALATIONS EVERY 6 HOURS AS NEEDED FOR WHEEZING OR SHORTNESS OF BREATH 12/24/22   Dana Allan, MD  allopurinol (ZYLOPRIM) 100 MG tablet Take 200 mg by mouth daily. 02/21/18   [provider]  ALPRAZolam Prudy Feeler) 0.5 MG tablet Take 0.5 mg by mouth as needed. 10/17/18   [provider]  atorvastatin (LIPITOR) 80 MG tablet TAKE 1 TABLET BY MOUTH EVERY DAY 07/28/23   Dana Allan, MD  Blood Glucose Monitoring Suppl (ONETOUCH VERIO FLEX SYSTEM) w/Device KIT by Does not apply route. Patient not taking: Reported on 07/27/2023 06/04/17   [provider]  cetirizine (ZYRTEC) 10 MG tablet Take 10 mg by mouth 2 (two) times daily.    [provider]  clopidogrel (PLAVIX) 75  MG tablet Take 1 tablet (75 mg total) by mouth daily. 08/07/23   Marcie Bal, NP  clotrimazole-betamethasone (LOTRISONE) cream APPLY 1 APPLICATION TOPICALLY DAILY 06/07/23   Standiford, Jenelle Mages, DPM  colchicine 0.6 MG tablet Take 0.6 mg by mouth 2 (two) times daily as needed. 06/20/19   [provider]  Continuous Blood Gluc Sensor (FREESTYLE LIBRE 2 SENSOR) MISC USE TO CHECK GLUCOSE THREE TIMES A DAY 12/02/22   Dana Allan, MD  fluticasone [facility]) 50 MCG/ACT nasal spray Place 2 sprays into both nostrils daily. Patient taking differently: Place 2 sprays into both nostrils daily as needed. 11/23/22   Dana Allan, MD  Fluticasone Furoate (ARNUITY ELLIPTA) 100 MCG/ACT AEPB Inhale 1 puff into the lungs daily in the afternoon. 03/02/23   Coralyn Helling, MD  glipiZIDE (GLUCOTROL XL) 10 MG 24 hr tablet Take 10 mg by mouth. 03/17/18   [provider]  HYDROcodone-acetaminophen (NORCO/VICODIN) 5-325 MG tablet Take 1 tablet by mouth every 4 (four) hours as needed for moderate pain. Patient not taking: Reported on 07/27/2023 03/23/23 03/22/24  Georgiana Spinner, NP  ipratropium (ATROVENT) 0.06 % nasal spray Place 2 sprays into both nostrils 4 (four) times daily. Patient not taking: Reported on 08/04/2023 06/13/21   Becky Augusta, NP  ketoconazole (NIZORAL) 2 % cream APPLY TOPICALLY TWICE A DAY AS NEEDED 04/26/19   [provider]  Melatonin 10 MG CAPS Take 10 mg by mouth every evening.    [provider]  Multiple Vitamin (MULTI-VITAMIN) tablet Take 1 tablet by mouth daily.    [provider]  omeprazole (PRILOSEC) 40 MG capsule TAKE 1 CAPSULE (40 MG TOTAL) BY MOUTH IN THE MORNING AND AT BEDTIME. 05/19/23   Dana Allan, MD  OneTouch Delica Lancets 33G MISC by Does not apply route. Patient not taking: Reported on 07/27/2023 06/04/17   [provider]  [facility] VERIO test strip  08/15/18   [provider]  OVER THE COUNTER MEDICATION Take 600 mg by mouth  daily. Goli Ashwagandha Gummies    [provider]  oxyCODONE-acetaminophen (PERCOCET/ROXICET) 5-325 MG tablet Take 1-2 tablets by mouth every 4 (four) hours as needed for moderate pain (pain score 4-6). 08/15/23   Messina Kosinski, Elita Boone, NP  pioglitazone (ACTOS) 30 MG tablet Take 30 mg by mouth daily.    [provider]  Probiotic Product (PROBIOTIC DAILY PO) Take 1 capsule by mouth daily.    [provider]  Semaglutide, 2 MG/DOSE, (OZEMPIC, 2 MG/DOSE,) 8 MG/3ML SOPN Inject 2 mg into the skin once a week. 03/04/21   [provider]  SYNJARDY XR 12.02-999 MG TB24 Take 2 tablets by mouth daily before breakfast. 03/05/21   [provider]  traZODone (DESYREL) 100 MG tablet Take 100 mg by mouth at bedtime. 03/23/21   [provider]  valsartan (DIOVAN) 320 MG tablet TAKE 1 TABLET BY MOUTH EVERY DAY 02/21/23   Dana Allan, MD    Family History Family History  Problem Relation Age of Onset   Heart attack Father 31   Alcohol abuse Father    Drug abuse Father    Heart disease Father    Lung cancer Father    Hypertension Mother    Heart attack Maternal Grandmother 62   Stroke Maternal Aunt 1    Social History Social History   Tobacco Use   Smoking status: Former    Current packs/day: 0.00    Average packs/day: 1.5 packs/day for 32.0 years (48.0 ttl pk-yrs)    Types: Cigarettes, E-cigarettes    Start date: 06/11/1986    Quit date: 06/11/2018    Years since quitting: 5.1   Smokeless tobacco: Former    Types: Snuff    Quit date: 1996   Tobacco comments:    I vape weekly."  Vaping Use   Vaping status: Some Days   Substances: Nicotine   Devices: x3 per day to quit smoking nicotine   Substance Use Topics   Alcohol use: Yes    Comment: cut back, 6 pack per month   Drug use: Not Currently    Types: Marijuana    Comment: a few times a year     Allergies   Aspirin   Review of Systems Review of Systems   Physical Exam Triage Vital  Signs ED Triage Vitals  Encounter Vitals Group     BP 08/15/23 1217 (!) 161/82  Systolic BP Percentile --      Diastolic BP Percentile --      Pulse Rate 08/15/23 1217 90     Resp 08/15/23 1217 18     Temp 08/15/23 1217 99.7 F (37.6 C)     Temp Source 08/15/23 1217 Oral     SpO2 08/15/23 1217 98 %     Weight 08/15/23 1215 250 lb (113.4 kg)     Height 08/15/23 1215 5\' 10"  (1.778 m)     Head Circumference --      Peak Flow --      Pain Score 08/15/23 1214 6     Pain Loc --      Pain Education --      Exclude from Growth Chart --    No data found.  Updated Vital Signs BP (!) 161/82 (BP Location: Left Arm)   Pulse 90   Temp 99.7 F (37.6 C) (Oral)   Resp 18   Ht 5\' 10"  (1.778 m)   Wt 250 lb (113.4 kg)   SpO2 98%   BMI 35.87 kg/m   Visual Acuity Right Eye Distance:   Left Eye Distance:   Bilateral Distance:    Right Eye Near:   Left Eye Near:    Bilateral Near:     Physical Exam Constitutional:      Appearance: Normal appearance.  Eyes:     Extraocular Movements: Extraocular movements intact.  Pulmonary:     Effort: Pulmonary effort is normal.  Skin:    Comments: Approximately 4 cm surgical incision present at the left groin, suturing in place, opening of the top of the surgical incision, scant serosanguineous drainage present, tender to palpation surrounding erythema to the wound bed without streaking 2+ femoral pulse  Neurological:     Mental Status: He is alert and oriented to person, place, and time. Mental status is at baseline.      UC Treatments / Results  Labs (all labs ordered are listed, but only abnormal results are displayed) Labs Reviewed - No data to display  EKG   Radiology No results found.  Procedures Procedures (including critical care time)  Medications Ordered in UC Medications  dexamethasone (DECADRON) injection 10 mg (10 mg Intramuscular Given 08/15/23 1246)    Initial Impression / Assessment and Plan / UC Course  I  have reviewed the triage vital signs and the nursing notes.  Pertinent labs & imaging results that were available during my care of the patient were reviewed by me and considered in my medical decision making (see chart for details).  Wound of left groin, initial encounter  Presentation consistent with infection, photos showing pus drainage brought to exam, prescribed doxycycline for 10 days, has follow-up appointment on November 20, has been using pain medicine every 4 hours most likely exacerbated by current infection, refilled, PDMP reviewed, low risk, additionally given Decadron IM and prescribed prednisone burst for outpatient use recommended daily cleansing and covering with a nonstick bandage to collect drainage and to prevent friction from clothing, strongly advised keeping upcoming follow-up with Dr., Follow-up with urgent care as needed Final Clinical Impressions(s) / UC Diagnoses   Final diagnoses:  Wound of left groin, initial encounter     Discharge Instructions      Your symptoms are consistent with infection  Begin doxycycline every morning and every evening for 10 days  You were given a injection of steroids today to help reduce inflammation and pain, starting tomorrow to prednisone every morning with  food for 5 days  May continue use of pain medicine every 4 hours as needed  Cleanse once a day with unscented soap and water, pat and do not rub then cover with a nonstick Band-Aid to keep area clear and prevent friction from clothing  You may take Tylenol every 6 hours as needed for pain  Please complete follow-up appointment with Doctor Who completed surgery for reevaluation after completion of antibiotic   ED Prescriptions     Medication Sig Dispense Auth. Provider   oxyCODONE-acetaminophen (PERCOCET/ROXICET) 5-325 MG tablet Take 1-2 tablets by mouth every 4 (four) hours as needed for moderate pain (pain score 4-6). 30 tablet Jamarrius Salay R, NP   predniSONE  (DELTASONE) 20 MG tablet Take 2 tablets (40 mg total) by mouth daily. 10 tablet Salli Quarry R, NP   doxycycline (VIBRAMYCIN) 100 MG capsule Take 1 capsule (100 mg total) by mouth 2 (two) times daily for 10 days. 20 capsule Valinda Hoar, NP      I have reviewed the PDMP during this encounter.   Valinda Hoar, NP 08/15/23 1327

## 2023-08-16 ENCOUNTER — Other Ambulatory Visit (INDEPENDENT_AMBULATORY_CARE_PROVIDER_SITE_OTHER): Payer: Self-pay | Admitting: Vascular Surgery

## 2023-08-16 ENCOUNTER — Telehealth (INDEPENDENT_AMBULATORY_CARE_PROVIDER_SITE_OTHER): Payer: Self-pay

## 2023-08-16 DIAGNOSIS — I739 Peripheral vascular disease, unspecified: Secondary | ICD-10-CM

## 2023-08-16 NOTE — Telephone Encounter (Signed)
It would be normal if his foot is swollen.  We should try to bring him in sooner even if it means doing it without studies

## 2023-08-16 NOTE — Telephone Encounter (Signed)
Spoke with pt's wife and they will be coming into the clinic at 8:30 am 11.12.24.

## 2023-08-16 NOTE — Telephone Encounter (Signed)
Patient's spouse called stating that the patient was taken to Urgent care over the weekend and his incision is infected. Patient was put on antibiotics and steroids as well. Patient's spouse states the patient's toes are tender and wondered if this is normal. Please advise.

## 2023-08-17 ENCOUNTER — Ambulatory Visit (INDEPENDENT_AMBULATORY_CARE_PROVIDER_SITE_OTHER): Payer: 59 | Admitting: Nurse Practitioner

## 2023-08-17 ENCOUNTER — Ambulatory Visit (INDEPENDENT_AMBULATORY_CARE_PROVIDER_SITE_OTHER): Payer: 59

## 2023-08-17 ENCOUNTER — Encounter (INDEPENDENT_AMBULATORY_CARE_PROVIDER_SITE_OTHER): Payer: Self-pay | Admitting: Nurse Practitioner

## 2023-08-17 VITALS — BP 126/71 | HR 81 | Resp 16

## 2023-08-17 DIAGNOSIS — I739 Peripheral vascular disease, unspecified: Secondary | ICD-10-CM

## 2023-08-17 DIAGNOSIS — I70222 Atherosclerosis of native arteries of extremities with rest pain, left leg: Secondary | ICD-10-CM

## 2023-08-17 DIAGNOSIS — Z9889 Other specified postprocedural states: Secondary | ICD-10-CM

## 2023-08-17 MED ORDER — GABAPENTIN 300 MG PO CAPS: 300.0000 mg | ORAL_CAPSULE | Freq: Every day | ORAL | Status: DC

## 2023-08-18 ENCOUNTER — Encounter (INDEPENDENT_AMBULATORY_CARE_PROVIDER_SITE_OTHER): Payer: Self-pay | Admitting: Nurse Practitioner

## 2023-08-18 NOTE — Progress Notes (Signed)
Subjective:    Patient ID: Kevin Huerta, male    DOB: 1966/04/20, 57 y.o.   MRN: [id number] Chief Complaint  Patient presents with   Routine Post Op    [facility] 3 week post op    The patient is a 57 year old male who presents today after left femoral endarterectomy.  He recently had to visit the urgent care due to infection in the wound.  He was placed on antibiotics and given steroids.  He was having significant pain but it is beginning to taper down and his mobility is beginning to improve as well.  Despite this improvement he still has significant discomfort.  He notes that his thigh is painful as are his toes.  Is able to ambulate with help of a walker.  Today he had noninvasive studies which show an ABI of 1.03 on the right and 1.01 on the left.  He has triphasic tibial artery waveforms bilaterally with good toe waveforms bilaterally.  His right lower extremity previously had an ABI 1.09 with an ABI 0.88 on the left    Review of Systems  Musculoskeletal:  Positive for gait problem.  Skin:  Positive for wound.  All other systems reviewed and are negative.      Objective:   Physical Exam Vitals reviewed.  HENT:     Head: Normocephalic.  Cardiovascular:     Rate and Rhythm: Normal rate.     Pulses:          Dorsalis pedis pulses are 2+ on the left side.       Posterior tibial pulses are 1+ on the left side.  Pulmonary:     Effort: Pulmonary effort is normal.  Musculoskeletal:     Left lower leg: No edema.  Skin:    General: Skin is warm.  Neurological:     Mental Status: He is alert and oriented to person, place, and time.  Psychiatric:        Mood and Affect: Mood normal.        Behavior: Behavior normal.        Thought Content: Thought content normal.        Judgment: Judgment normal.     BP 126/71 (BP Location: Right Arm)   Pulse 81   Resp 16   Past Medical History:  Diagnosis Date   Allergic rhinitis, seasonal    Anginal equivalent (HCC)    Anxiety    a.)  on BZO (alprazolam) PRN   Aortic atherosclerosis (HCC)    Arthritis    Asthma    Atherosclerosis of native artery of both lower extremities with intermittent claudication (HCC)    a.) s/p PTA of RIGHT prox peroneal, prox SFA, popliteal, and distal SFA; 6 mm x 15 cm Viabahn stent to RIGHT distal SFA and above knee popliteal artery; b.) s/p unsuccessful PTA of occluded SFA 03/22/2023 --> plans for endarterectomy +/-  endoluminal intervention   BRBPR (bright red blood per rectum) 05/24/2020   Chest heaviness 10/27/2022   Coronary artery disease of native artery of native heart with stable angina pectoris (HCC) 03/29/2023   a.) cCTA 03/29/2023: Ca2+ 476 (95th percentile for age/sex/race matched control)   Current vaping on some days    DDD (degenerative disc disease), cervical    a.) s/p ACDF C3-C5 2014   Dermatitis 06/02/2021   Diarrhea 02/26/2021   Emphysema lung (HCC)    Fatty liver 09/03/2020   GERD (gastroesophageal reflux disease)    Gout    Heart  murmur    History of cardiac catheterization 11/27/2008   a.) LHC 11/27/2008 at [facility] --> normal coronary anatomy with no obstructive CAD   Hyperlipidemia    Hypersomnia 07/17/2014   Hypertension    Insomnia    a.) uses melatonin +/- trazodone PRN   Leg cramps 02/26/2021   Marijuana use, episodic    Occasional alcohol consumption    On long term clopidogrel therapy    OSA (obstructive sleep apnea)    a.) not currently on nocturnal PAP therapy; needs repeat PSG   Seasonal allergies    T2DM (type 2 diabetes mellitus) (HCC)    a.) monitors with Freestyle Libre CGM   Tinnitus     Social History   Socioeconomic History   Marital status: Married    Spouse name: leslie   Number of children: 3   Years of education: Not on file   Highest education level: Not on file  Occupational History   Occupation: IT   Tobacco Use   Smoking status: Former    Current packs/day: 0.00    Average packs/day: 1.5 packs/day for 32.0  years (48.0 ttl pk-yrs)    Types: Cigarettes, E-cigarettes    Start date: 06/11/1986    Quit date: 06/11/2018    Years since quitting: 5.1   Smokeless tobacco: Former    Types: Snuff    Quit date: 1996   Tobacco comments:    I vape weekly."  Vaping Use   Vaping status: Some Days   Substances: Nicotine   Devices: x3 per day to quit smoking nicotine   Substance and Sexual Activity   Alcohol use: Yes    Comment: cut back, 6 pack per month   Drug use: Not Currently    Types: Marijuana    Comment: a few times a year   Sexual activity: Yes    Partners: Female    Birth control/protection: None  Other Topics Concern   Not on file  Social History Narrative   10/15/20   From: [place] originally, but in the area since 2000   Living: with wife, Verlon Au (2003) and granddaughter   Work: Scientist, 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[employer]      Family: 3 daughters - Irish Lack, Toni Amend (just finished law school), [place], and granddaughter McKenzie (2011)      Enjoys: travel when able, beach, cycling      Exercise: exercise bike at home   Diet: does not follow diabetic diet, lower appetite with ozempic      Safety   Seat belts: Yes    Guns: Yes  and secure   Safe in relationships: Yes    Social Determinants of Health   Financial Resource Strain: Low Risk  (10/07/2020)   Overall Financial Resource Strain (CARDIA)    Difficulty of Paying Living Expenses: Not hard at all  Food Insecurity: No Food Insecurity (08/05/2023)   Hunger Vital Sign    Worried About Running Out of Food in the Last Year: Never true    Ran Out of Food in the Last Year: Never true  Transportation Needs: No Transportation Needs (08/05/2023)   PRAPARE - Administrator, Civil Service (Medical): No    Lack of Transportation (Non-Medical): No  Physical Activity: Insufficiently Active (03/11/2018)   Received from [facility], [facility]   Exercise Vital Sign    Days of Exercise per Week: 1 day    Minutes of Exercise per  Session: 20 min  Stress: No Stress Concern Present (  03/11/2018)   Received from [facility], [facility]   [facility] of Occupational Health - Occupational Stress Questionnaire    Feeling of Stress : Only a little  Social Connections: Unknown (02/13/2022)   Received from [facility], [facility]   Social Network    Social Network: Not on file  Intimate Partner Violence: Not At Risk (08/05/2023)   Humiliation, Afraid, Rape, and Kick questionnaire    Fear of Current or Ex-Partner: No    Emotionally Abused: No    Physically Abused: No    Sexually Abused: No    Past Surgical History:  Procedure Laterality Date   ANTERIOR CERVICAL DECOMP/DISCECTOMY FUSION N/A 2014   ENDARTERECTOMY FEMORAL Left 08/04/2023   Procedure: ENDARTERECTOMY FEMORAL (SFA STENT PLACEMENT);  Surgeon: Annice Needy, MD;  Location: [facility] ORS;  Service: Vascular;  Laterality: Left;   KNEE SURGERY Right    meniscus   LEFT HEART CATH AND CORONARY ANGIOGRAPHY Left 11/27/2008   Procedure: LEFT HEART CATH AND CORONARY ANGIOGRAPHY; Location: [facility]; Surgeon: Shari Prows, MD   LOWER EXTREMITY ANGIOGRAPHY Right 11/16/2022   Procedure: Lower Extremity Angiography;  Surgeon: Annice Needy, MD;  Location: [facility] INVASIVE CV LAB;  Service: Cardiovascular;  Laterality: Right;   LOWER EXTREMITY ANGIOGRAPHY Left 03/22/2023   Procedure: Lower Extremity Angiography;  Surgeon: Annice Needy, MD;  Location: [facility] INVASIVE CV LAB;  Service: Cardiovascular;  Laterality: Left;   ROTATOR CUFF REPAIR Right    WISDOM TOOTH EXTRACTION      Family History  Problem Relation Age of Onset   Heart attack Father 4   Alcohol abuse Father    Drug abuse Father    Heart disease Father    Lung cancer Father    Hypertension Mother    Heart attack Maternal Grandmother 49   Stroke Maternal Aunt 6    Allergies  Allergen Reactions   Aspirin Tinitus       Latest Ref Rng & Units 08/05/2023   12:19 AM  08/04/2023    2:59 PM 07/27/2023   11:29 AM  CBC  WBC 4.0 - 10.5 K/uL 9.0  7.5  4.2   Hemoglobin 13.0 - 17.0 g/dL 16.1  09.6  04.5   Hematocrit 39.0 - 52.0 % 35.0  37.6  44.9   Platelets 150 - 400 K/uL 209  218  257       CMP     Component Value Date/Time   NA 134 (L) 08/05/2023 0019   K 3.5 08/05/2023 0019   CL 102 08/05/2023 0019   CO2 22 08/05/2023 0019   GLUCOSE 241 (H) 08/05/2023 0019   BUN 14 08/05/2023 0019   CREATININE 0.86 08/05/2023 0019   CALCIUM 8.5 (L) 08/05/2023 0019   PROT 8.1 07/27/2023 1129   ALBUMIN 4.6 07/27/2023 1129   AST 21 07/27/2023 1129   ALT 29 07/27/2023 1129   ALKPHOS 67 07/27/2023 1129   BILITOT 0.6 07/27/2023 1129   GFR 83.22 10/27/2022 0948   EGFR 7.9 02/17/2022 0000   GFRNONAA >60 08/05/2023 0019     VAS [place] ABI WITH/WO TBI  Result Date: 06/28/2023  LOWER EXTREMITY DOPPLER STUDY Patient Name:  Brittney Remund  Date of Exam:   06/28/2023 Medical Rec #: [id number]       Accession #:    [id number] Date of Birth: [birth year]       Patient Gender: M Patient Age:   57 years Exam Location:  [facility] Procedure:  VAS [place] ABI WITH/WO TBI Referring Phys: Festus Barren --------------------------------------------------------------------------------  Indications: Claudication, and peripheral artery disease.  Vascular Interventions: 11/16/2022: Aotogram and Selective Right Lower Extremity                         Angiogram. PTAof the Right Proximal Peroneal Artery,                         Tibioperoneal trunk with 3 mm diameter angioplasty                         balloon. PTA of the Right CFA and Proximal SFA with 5 mm                         diameter Lutonix drug coated angioplasty balloon. PTA of                         the Right Popliteal Artery and Distal SFA with 4 mm                         diameter by 15 cm length Lutonix drug coated angioplasty                         balloon. Viabahnstent placement to the Right most distal                          SFA and above knee Popliteal Artery with 6 mm diameter                         by 15 cm length Viabahn stent for greater than 50%                         residual stenosis after angioplasty.                          03/22/2023: Aortogram and Selective Left Lower Extremity                         Angiogram. Comparison Study: 03/15/2023 Performing Technologist: Debbe Bales RVS  Examination Guidelines: A complete evaluation includes at minimum, Doppler waveform signals and systolic blood pressure reading at the level of bilateral brachial, anterior tibial, and posterior tibial arteries, when vessel segments are accessible. Bilateral testing is considered an integral part of a complete examination. Photoelectric Plethysmograph (PPG) waveforms and toe systolic pressure readings are included as required and additional duplex testing as needed. Limited examinations for reoccurring indications may be performed as noted.  ABI Findings: +---------+------------------+-----+---------+--------+ Right    Rt Pressure (mmHg)IndexWaveform Comment  +---------+------------------+-----+---------+--------+ Brachial 138                                      +---------+------------------+-----+---------+--------+ ATA      137               0.99 triphasic         +---------+------------------+-----+---------+--------+ PTA      150               1.09  triphasic         +---------+------------------+-----+---------+--------+ Great Toe126               0.91 Normal            +---------+------------------+-----+---------+--------+ +---------+------------------+-----+--------+-------+ Left     Lt Pressure (mmHg)IndexWaveformComment +---------+------------------+-----+--------+-------+ Brachial 134                                    +---------+------------------+-----+--------+-------+ ATA      115               0.83 biphasic        +---------+------------------+-----+--------+-------+ PTA       122               0.88 biphasic        +---------+------------------+-----+--------+-------+ Great Toe111               0.80 Normal          +---------+------------------+-----+--------+-------+ +-------+-----------+-----------+------------+------------+ ABI/TBIToday's ABIToday's TBIPrevious ABIPrevious TBI +-------+-----------+-----------+------------+------------+ Right  1.09       .91        1.02        1.22         +-------+-----------+-----------+------------+------------+ Left   .88        .80        .93         1.18         +-------+-----------+-----------+------------+------------+ Left ABIs and TBIs appear decreased compared to prior study on 03/15/2023. Right ABIs appear essentially unchanged compared to prior study on 03/15/2023. Right TBIs appear to be decreased compared to prior study on 03/15/2023.  Summary: Right: Resting right ankle-brachial index is within normal range. The right toe-brachial index is normal. Left: Resting left ankle-brachial index indicates mild left lower extremity arterial disease. The left toe-brachial index is normal. *See table(s) above for measurements and observations.  Electronically signed by Festus Barren MD on 06/28/2023 at 2:18:05 PM.    Final        Assessment & Plan:   1. Atherosclerosis of native artery of left lower extremity with rest pain [facility]) The patient was previously seen in urgent care for infection of his incision.  Today it does not show much evidence of infection but he is still currently on steroids and antibiotics.  There is a small area approximately that has some excoriation notable that this will heal soon.  He has some tenderness in his toes and I suspect this is nerve related.  In addition to the pain he is having in his thigh.  His wife notes there is gabapentin at home and given the amount, we will not send in prescription but I do advise him to begin 300 mg in the evening to see if this helps with some of his  pain.  Additionally he can alternate with some ibuprofen.  Once you are that of his current prescription we will send in oxycodone to allow him to better alternate between use of Tylenol as well.  Overall I feel like he is progressing fairly well.  Will have her return next week to remove sutures.   Current Outpatient Medications on File Prior to Visit  Medication Sig Dispense Refill   albuterol (VENTOLIN HFA) 108 (90 Base) MCG/ACT inhaler USE 2 INHALATIONS EVERY 6 HOURS AS NEEDED FOR WHEEZING OR SHORTNESS OF BREATH 8.5 g 10   allopurinol (ZYLOPRIM) 100 MG tablet Take 200  mg by mouth daily.     ALPRAZolam (XANAX) 0.5 MG tablet Take 0.5 mg by mouth as needed.     atorvastatin (LIPITOR) 80 MG tablet TAKE 1 TABLET BY MOUTH EVERY DAY 90 tablet 3   cetirizine (ZYRTEC) 10 MG tablet Take 10 mg by mouth 2 (two) times daily.     clopidogrel (PLAVIX) 75 MG tablet Take 1 tablet (75 mg total) by mouth daily. 90 tablet 4   clotrimazole-betamethasone (LOTRISONE) cream APPLY 1 APPLICATION TOPICALLY DAILY 30 g 0   colchicine 0.6 MG tablet Take 0.6 mg by mouth 2 (two) times daily as needed.     Continuous Blood Gluc Sensor (FREESTYLE LIBRE 2 SENSOR) MISC USE TO CHECK GLUCOSE THREE TIMES A DAY 2 each 12   doxycycline (VIBRAMYCIN) 100 MG capsule Take 1 capsule (100 mg total) by mouth 2 (two) times daily for 10 days. 20 capsule 0   fluticasone (FLONASE) 50 MCG/ACT nasal spray Place 2 sprays into both nostrils daily. (Patient taking differently: Place 2 sprays into both nostrils daily as needed.) 16 g 6   Fluticasone Furoate (ARNUITY ELLIPTA) 100 MCG/ACT AEPB Inhale 1 puff into the lungs daily in the afternoon. 180 each 2   glipiZIDE (GLUCOTROL XL) 10 MG 24 hr tablet Take 10 mg by mouth.     ketoconazole (NIZORAL) 2 % cream APPLY TOPICALLY TWICE A DAY AS NEEDED     Melatonin 10 MG CAPS Take 10 mg by mouth every evening.     Multiple Vitamin (MULTI-VITAMIN) tablet Take 1 tablet by mouth daily.     omeprazole  (PRILOSEC) 40 MG capsule TAKE 1 CAPSULE (40 MG TOTAL) BY MOUTH IN THE MORNING AND AT BEDTIME. 180 capsule 1   OVER THE COUNTER MEDICATION Take 600 mg by mouth daily. Goli Ashwagandha Gummies     oxyCODONE-acetaminophen (PERCOCET/ROXICET) 5-325 MG tablet Take 1-2 tablets by mouth every 4 (four) hours as needed for moderate pain (pain score 4-6). 30 tablet 0   pioglitazone (ACTOS) 30 MG tablet Take 30 mg by mouth daily.     predniSONE (DELTASONE) 20 MG tablet Take 2 tablets (40 mg total) by mouth daily. 10 tablet 0   Probiotic Product (PROBIOTIC DAILY PO) Take 1 capsule by mouth daily.     Semaglutide, 2 MG/DOSE, (OZEMPIC, 2 MG/DOSE,) 8 MG/3ML SOPN Inject 2 mg into the skin once a week.     SYNJARDY XR 12.02-999 MG TB24 Take 2 tablets by mouth daily before breakfast.     traZODone (DESYREL) 100 MG tablet Take 100 mg by mouth at bedtime.     valsartan (DIOVAN) 320 MG tablet TAKE 1 TABLET BY MOUTH EVERY DAY 90 tablet 3   Blood Glucose Monitoring Suppl (ONETOUCH VERIO FLEX SYSTEM) w/Device KIT by Does not apply route. (Patient not taking: Reported on 07/27/2023)     HYDROcodone-acetaminophen (NORCO/VICODIN) 5-325 MG tablet Take 1 tablet by mouth every 4 (four) hours as needed for moderate pain. (Patient not taking: Reported on 07/27/2023) 20 tablet 0   ipratropium (ATROVENT) 0.06 % nasal spray Place 2 sprays into both nostrils 4 (four) times daily. (Patient not taking: Reported on 08/04/2023) 15 mL 12   OneTouch Delica Lancets 33G MISC by Does not apply route. (Patient not taking: Reported on 07/27/2023)     ONETOUCH VERIO test strip  (Patient not taking: Reported on 07/27/2023)     No current facility-administered medications on file prior to visit.    There are no Patient Instructions on file for this visit.  No follow-ups on file.   Georgiana Spinner, NP

## 2023-08-20 ENCOUNTER — Other Ambulatory Visit: Payer: Self-pay | Admitting: Podiatry

## 2023-08-20 LAB — VAS US ABI WITH/WO TBI
Left ABI: 1.01
Right ABI: 1.03

## 2023-08-23 ENCOUNTER — Telehealth: Payer: Self-pay

## 2023-08-23 NOTE — Telephone Encounter (Signed)
Kevin Huerta called for Kevin Huerta stating he had a procedure on 08/04/23. He went to an urgent care and received Abx for an infection. She feels  his wound doesn't look like it is healing being that he was on a Abx. She stated they attached a photo to the Mychart message to view.

## 2023-08-25 ENCOUNTER — Ambulatory Visit (INDEPENDENT_AMBULATORY_CARE_PROVIDER_SITE_OTHER): Payer: 59 | Admitting: Nurse Practitioner

## 2023-08-25 ENCOUNTER — Encounter (INDEPENDENT_AMBULATORY_CARE_PROVIDER_SITE_OTHER): Payer: 59

## 2023-08-25 ENCOUNTER — Other Ambulatory Visit (INDEPENDENT_AMBULATORY_CARE_PROVIDER_SITE_OTHER): Payer: Self-pay | Admitting: Nurse Practitioner

## 2023-08-25 ENCOUNTER — Encounter (INDEPENDENT_AMBULATORY_CARE_PROVIDER_SITE_OTHER): Payer: Self-pay | Admitting: Nurse Practitioner

## 2023-08-25 ENCOUNTER — Encounter (INDEPENDENT_AMBULATORY_CARE_PROVIDER_SITE_OTHER): Payer: 59 | Admitting: Nurse Practitioner

## 2023-08-25 ENCOUNTER — Encounter: Payer: Self-pay | Admitting: Family Medicine

## 2023-08-25 VITALS — BP 140/83 | HR 89 | Resp 89

## 2023-08-25 DIAGNOSIS — Z9889 Other specified postprocedural states: Secondary | ICD-10-CM

## 2023-08-25 DIAGNOSIS — I739 Peripheral vascular disease, unspecified: Secondary | ICD-10-CM

## 2023-08-25 MED ORDER — OXYCODONE HCL 5 MG PO TABS: 5.0000 mg | ORAL_TABLET | Freq: Four times a day (QID) | ORAL | 0 refills | Status: DC | PRN

## 2023-08-25 MED ORDER — DOXYCYCLINE HYCLATE 100 MG PO CAPS: 100.0000 mg | ORAL_CAPSULE | Freq: Two times a day (BID) | ORAL | 0 refills | Status: DC

## 2023-08-25 MED ORDER — CYCLOBENZAPRINE HCL 5 MG PO TABS: 5.0000 mg | ORAL_TABLET | Freq: Three times a day (TID) | ORAL | 0 refills | Status: DC | PRN

## 2023-08-25 MED ORDER — MEDIHONEY WOUND/BURN DRESSING EX PSTE: 1.0000 | PASTE | Freq: Every day | CUTANEOUS | 2 refills | Status: DC

## 2023-08-27 ENCOUNTER — Telehealth (INDEPENDENT_AMBULATORY_CARE_PROVIDER_SITE_OTHER): Payer: Self-pay | Admitting: Vascular Surgery

## 2023-08-27 NOTE — Telephone Encounter (Signed)
Has he been more active? If so that may be a cause for his swelling. If not, he can come in for a lab only dvt to rule out anything on next week.  As far as the pain, that was consistent when I saw him two days ago and again, it is normal for him to have pain with movement as this was a very large surgery and it will take time for him to heal

## 2023-08-27 NOTE — Telephone Encounter (Signed)
Had post op on Wednesday, 08/25/23. Foot was swollen last night but as of today his entire leg is swollen from his hip all the way down to his foot. Lab cultures done on Wednesday, 11/20 no results yet. On antibiotics since the 08/15/23. Pain level 2-6. Level 6 when he moves. Please advise.

## 2023-08-27 NOTE — Telephone Encounter (Signed)
Patient spouse notified with medical recommendations and verbalized understanding. Patient spouse will monitor her husband over the week and will contact the office if symptoms worsening.

## 2023-08-30 ENCOUNTER — Ambulatory Visit: Payer: 59 | Admitting: Podiatry

## 2023-08-30 ENCOUNTER — Encounter (INDEPENDENT_AMBULATORY_CARE_PROVIDER_SITE_OTHER): Payer: Self-pay | Admitting: Nurse Practitioner

## 2023-08-30 ENCOUNTER — Encounter: Payer: Self-pay | Admitting: Podiatry

## 2023-08-30 ENCOUNTER — Other Ambulatory Visit (INDEPENDENT_AMBULATORY_CARE_PROVIDER_SITE_OTHER): Payer: Self-pay | Admitting: Nurse Practitioner

## 2023-08-30 VITALS — Ht 70.0 in | Wt 250.0 lb

## 2023-08-30 DIAGNOSIS — L84 Corns and callosities: Secondary | ICD-10-CM

## 2023-08-30 DIAGNOSIS — B351 Tinea unguium: Secondary | ICD-10-CM | POA: Diagnosis not present

## 2023-08-30 DIAGNOSIS — M79675 Pain in left toe(s): Secondary | ICD-10-CM | POA: Diagnosis not present

## 2023-08-30 DIAGNOSIS — M79674 Pain in right toe(s): Secondary | ICD-10-CM | POA: Diagnosis not present

## 2023-08-30 DIAGNOSIS — E119 Type 2 diabetes mellitus without complications: Secondary | ICD-10-CM

## 2023-08-30 DIAGNOSIS — E1151 Type 2 diabetes mellitus with diabetic peripheral angiopathy without gangrene: Secondary | ICD-10-CM | POA: Diagnosis not present

## 2023-08-30 LAB — AEROBIC CULTURE

## 2023-08-30 MED ORDER — AMOXICILLIN-POT CLAVULANATE 875-125 MG PO TABS: 1.0000 | ORAL_TABLET | Freq: Two times a day (BID) | ORAL | 0 refills | Status: DC

## 2023-08-30 NOTE — Progress Notes (Signed)
  Subjective:  Patient ID: Kevin Huerta, male    DOB: 16-Nov-1965,  MRN: [id number]  "You should be able to feel a pulse in my left foot now." Kevin Huerta presents to clinic today for preventative diabetic foot care and painful thick toenails that are difficult to trim. Pain interferes with ambulation. Aggravating factors include wearing enclosed shoe gear. Pain is relieved with periodic professional debridement. Patient states he had revascularization performed on his LLE. States he is still recovering and leg is a little sore. Chief Complaint  Patient presents with   Diabetes    Patient is here for RFC, Last A1C: 9.1(08/04/2023) Pt is on blood thinners( Plavix)   New problem(s): None.   PCP is Dana Allan, MD.  Allergies  Allergen Reactions   Aspirin Tinitus    Review of Systems: Negative except as noted in the HPI.  Objective:  There were no vitals filed for this visit. Kevin Huerta is a pleasant 57 y.o. male obese in NAD. AAO x 3.  Vascular Examination: Capillary refill time <3 seconds b/l. Vascular status intact b/l with palpable DP pulses. PT pulses palpable. Trace edema LLE. Pedal hair absent b/l. No pain with calf compression b/l. Skin temperature gradient WNL b/l. No cyanosis or clubbing b/l. No ischemia or gangrene noted b/l.   Neurological Examination: Sensation grossly intact b/l with 10 gram monofilament. Vibratory sensation intact b/l.   Dermatological Examination: Pedal skin with normal turgor, texture and tone b/l.  No open wounds. No interdigital macerations.   Toenails 1-5 b/l thick, discolored, elongated with subungual debris and pain on dorsal palpation.   Hyperkeratotic lesion(s) bilateral great toes.  No erythema, no edema, no drainage, no fluctuance.  Musculoskeletal Examination: Muscle strength 5/5 to all lower extremity muscle groups bilaterally. HAV with bunion deformity noted b/l LE. Pes planus deformity noted bilateral LE. Patient ambulates  independent of any assistive aids.  Radiographs: None  Assessment/Plan: 1. Pain due to onychomycosis of toenails of both feet [B35.1, M79.675, M79.674]   2. Callus   3. Type II diabetes mellitus with peripheral circulatory disorder [facility])    -Consent given for treatment as described below: -Examined patient. -Patient with recent h/o LLE vascular intervention with Vascular Surgery. Recovering well. -Patient to continue soft, supportive shoe gear daily. -Mycotic toenails 1-5 bilaterally were debrided in length and girth with sterile nail nippers and dremel without incident. -Callus(es) bilateral great toes pared utilizing sterile scalpel blade without complication or incident. Total number debrided =2. -Patient/POA to call should there be question/concern in the interim.   Return in about 3 months (around 11/30/2023).  Kevin Huerta, DPM       LOCATION: [address], [place] [address]                   Office [phone]   [facility] LOCATION: [address] [place], [place] [address] Office [phone]

## 2023-08-30 NOTE — Progress Notes (Signed)
Let him know I have sent in augmentin based on his culture results.  He should stop the Doxy

## 2023-08-30 NOTE — Progress Notes (Signed)
Subjective:    Patient ID: Kevin Huerta, male    DOB: 10-Aug-1966, 57 y.o.   MRN: [id number] Chief Complaint  Patient presents with   Follow-up    1 week follow up    The patient is a 57 year old male following left femoral endarterectomy.  He notes that he is walking better since he was last seen.  His pain is about a 2 at rest and 6 with activity.  He has some drainage noted from his wound and he needs it open to air sporadically.  There is no gross dehiscence of the wound.  There is diffuse swelling areas of fibrinous exudate primarily the proximal portion.  He is having a little swelling in his foot and ankle but not significant.    Review of Systems  Cardiovascular:  Positive for leg swelling.  Musculoskeletal:  Positive for gait problem.  Skin:  Positive for wound.  All other systems reviewed and are negative.      Objective:   Physical Exam Vitals reviewed.  HENT:     Head: Normocephalic.  Cardiovascular:     Rate and Rhythm: Normal rate.     Pulses:          Dorsalis pedis pulses are 2+ on the left side.  Pulmonary:     Effort: Pulmonary effort is normal.  Musculoskeletal:     Left lower leg: 1+ Edema present.  Skin:    General: Skin is warm and dry.  Neurological:     Mental Status: He is alert and oriented to person, place, and time.  Psychiatric:        Mood and Affect: Mood normal.        Behavior: Behavior normal.        Thought Content: Thought content normal.        Judgment: Judgment normal.     BP (!) 140/83 (BP Location: Right Arm)   Pulse 89   Resp (!) 89   Past Medical History:  Diagnosis Date   Allergic rhinitis, seasonal    Anginal equivalent (HCC)    Anxiety    a.) on BZO (alprazolam) PRN   Aortic atherosclerosis (HCC)    Arthritis    Asthma    Atherosclerosis of native artery of both lower extremities with intermittent claudication (HCC)    a.) s/p PTA of RIGHT prox peroneal, prox SFA, popliteal, and distal SFA; 6 mm x 15 cm  Viabahn stent to RIGHT distal SFA and above knee popliteal artery; b.) s/p unsuccessful PTA of occluded SFA 03/22/2023 --> plans for endarterectomy +/-  endoluminal intervention   BRBPR (bright red blood per rectum) 05/24/2020   Chest heaviness 10/27/2022   Coronary artery disease of native artery of native heart with stable angina pectoris (HCC) 03/29/2023   a.) cCTA 03/29/2023: Ca2+ 476 (95th percentile for age/sex/race matched control)   Current vaping on some days    DDD (degenerative disc disease), cervical    a.) s/p ACDF C3-C5 2014   Dermatitis 06/02/2021   Diarrhea 02/26/2021   Emphysema lung (HCC)    Fatty liver 09/03/2020   GERD (gastroesophageal reflux disease)    Gout    Heart murmur    History of cardiac catheterization 11/27/2008   a.) LHC 11/27/2008 [facility] --> normal coronary anatomy with no obstructive CAD   Hyperlipidemia    Hypersomnia 07/17/2014   Hypertension    Insomnia    a.) uses melatonin +/- trazodone PRN   Leg cramps 02/26/2021  Marijuana use, episodic    Occasional alcohol consumption    On long term clopidogrel therapy    OSA (obstructive sleep apnea)    a.) not currently on nocturnal PAP therapy; needs repeat PSG   Seasonal allergies    T2DM (type 2 diabetes mellitus) (HCC)    a.) monitors with Freestyle Libre CGM   Tinnitus     Social History   Socioeconomic History   Marital status: Married    Spouse name: leslie   Number of children: 3   Years of education: Not on file   Highest education level: Not on file  Occupational History   Occupation: IT   Tobacco Use   Smoking status: Former    Current packs/day: 0.00    Average packs/day: 1.5 packs/day for 32.0 years (48.0 ttl pk-yrs)    Types: Cigarettes, E-cigarettes    Start date: 06/11/1986    Quit date: 06/11/2018    Years since quitting: 5.2   Smokeless tobacco: Former    Types: Snuff    Quit date: 1996   Tobacco comments:    I vape weekly."  Vaping Use   Vaping  status: Some Days   Substances: Nicotine   Devices: x3 per day to quit smoking nicotine   Substance and Sexual Activity   Alcohol use: Yes    Comment: cut back, 6 pack per month   Drug use: Not Currently    Types: Marijuana    Comment: a few times a year   Sexual activity: Yes    Partners: Female    Birth control/protection: None  Other Topics Concern   Not on file  Social History Narrative   10/15/20   From: [place] originally, but in the area since 2000   Living: with wife, Verlon Au (2003) and granddaughter   Work: Scientist, 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[employer]      Family: 3 daughters - Irish Lack, Toni Amend (just finished law school), [place], and granddaughter McKenzie (2011)      Enjoys: travel when able, beach, cycling      Exercise: exercise bike at home   Diet: does not follow diabetic diet, lower appetite with ozempic      Safety   Seat belts: Yes    Guns: Yes  and secure   Safe in relationships: Yes    Social Determinants of Health   Financial Resource Strain: Low Risk  (10/07/2020)   Overall Financial Resource Strain (CARDIA)    Difficulty of Paying Living Expenses: Not hard at all  Food Insecurity: No Food Insecurity (08/05/2023)   Hunger Vital Sign    Worried About Running Out of Food in the Last Year: Never true    Ran Out of Food in the Last Year: Never true  Transportation Needs: No Transportation Needs (08/05/2023)   PRAPARE - Administrator, Civil Service (Medical): No    Lack of Transportation (Non-Medical): No  Physical Activity: Insufficiently Active (03/11/2018)   Received from [facility]   Exercise Vital Sign    Days of Exercise per Week: 1 day    Minutes of Exercise per Session: 20 min  Stress: No Stress Concern Present (03/11/2018)   Received from [facility], [facility] - Occupational Stress Questionnaire    Feeling of Stress : Only a little  Social Connections: Unknown (02/13/2022)    Received from [facility], [facility]   Social Network    Social Network: Not on file  Intimate  Partner Violence: Not At Risk (08/05/2023)   Humiliation, Afraid, Rape, and Kick questionnaire    Fear of Current or Ex-Partner: No    Emotionally Abused: No    Physically Abused: No    Sexually Abused: No    Past Surgical History:  Procedure Laterality Date   ANTERIOR CERVICAL DECOMP/DISCECTOMY FUSION N/A 2014   ENDARTERECTOMY FEMORAL Left 08/04/2023   Procedure: ENDARTERECTOMY FEMORAL (SFA STENT PLACEMENT);  Surgeon: Annice Needy, MD;  Location: [facility] ORS;  Service: Vascular;  Laterality: Left;   KNEE SURGERY Right    meniscus   LEFT HEART CATH AND CORONARY ANGIOGRAPHY Left 11/27/2008   Procedure: LEFT HEART CATH AND CORONARY ANGIOGRAPHY; Location: [facility]; Surgeon: Shari Prows, MD   LOWER EXTREMITY ANGIOGRAPHY Right 11/16/2022   Procedure: Lower Extremity Angiography;  Surgeon: Annice Needy, MD;  Location: [facility] INVASIVE CV LAB;  Service: Cardiovascular;  Laterality: Right;   LOWER EXTREMITY ANGIOGRAPHY Left 03/22/2023   Procedure: Lower Extremity Angiography;  Surgeon: Annice Needy, MD;  Location: [facility] INVASIVE CV LAB;  Service: Cardiovascular;  Laterality: Left;   ROTATOR CUFF REPAIR Right    WISDOM TOOTH EXTRACTION      Family History  Problem Relation Age of Onset   Heart attack Father 24   Alcohol abuse Father    Drug abuse Father    Heart disease Father    Lung cancer Father    Hypertension Mother    Heart attack Maternal Grandmother 31   Stroke Maternal Aunt 42    Allergies  Allergen Reactions   Aspirin Tinitus       Latest Ref Rng & Units 08/05/2023   12:19 AM 08/04/2023    2:59 PM 07/27/2023   11:29 AM  CBC  WBC 4.0 - 10.5 K/uL 9.0  7.5  4.2   Hemoglobin 13.0 - 17.0 g/dL 60.4  54.0  98.1   Hematocrit 39.0 - 52.0 % 35.0  37.6  44.9   Platelets 150 - 400 K/uL 209  218  257       CMP     Component Value Date/Time   NA  134 (L) 08/05/2023 0019   K 3.5 08/05/2023 0019   CL 102 08/05/2023 0019   CO2 22 08/05/2023 0019   GLUCOSE 241 (H) 08/05/2023 0019   BUN 14 08/05/2023 0019   CREATININE 0.86 08/05/2023 0019   CALCIUM 8.5 (L) 08/05/2023 0019   PROT 8.1 07/27/2023 1129   ALBUMIN 4.6 07/27/2023 1129   AST 21 07/27/2023 1129   ALT 29 07/27/2023 1129   ALKPHOS 67 07/27/2023 1129   BILITOT 0.6 07/27/2023 1129   GFR 83.22 10/27/2022 0948   EGFR 7.9 02/17/2022 0000   GFRNONAA >60 08/05/2023 0019     VAS [place] ABI WITH/WO TBI  Result Date: 08/20/2023  LOWER EXTREMITY DOPPLER STUDY Patient Name:  Deron Ponce  Date of Exam:   08/17/2023 Medical Rec #: [id number]       Accession #:    [id number] Date of Birth: 25-Mar-1966       Patient Gender: M Patient Age:   5 years Exam Location:  [facility] Procedure:      VAS [place] ABI WITH/WO TBI Referring Phys: --------------------------------------------------------------------------------  Indications: Claudication, and peripheral artery disease.  Vascular Interventions: 11/16/2022: Aotogram and Selective Right Lower Extremity                         Angiogram. PTAof the Right  Proximal Peroneal Artery,                         Tibioperoneal trunk with 3 mm diameter angioplasty                         balloon. PTA of the Right CFA and Proximal SFA with 5 mm                         diameter Lutonix drug coated angioplasty balloon. PTA of                         the Right Popliteal Artery and Distal SFA with 4 mm                         diameter by 15 cm length Lutonix drug coated angioplasty                         balloon. Viabahnstent placement to the Right most distal                         SFA and above knee Popliteal Artery with 6 mm diameter                         by 15 cm length Viabahn stent for greater than 50%                         residual stenosis after angioplasty.                          03/22/2023: Aortogram and Selective Left Lower Extremity                          Angiogram. Comparison Study: 06/28/2023 Performing Technologist: Salvadore Farber RVT  Examination Guidelines: A complete evaluation includes at minimum, Doppler waveform signals and systolic blood pressure reading at the level of bilateral brachial, anterior tibial, and posterior tibial arteries, when vessel segments are accessible. Bilateral testing is considered an integral part of a complete examination. Photoelectric Plethysmograph (PPG) waveforms and toe systolic pressure readings are included as required and additional duplex testing as needed. Limited examinations for reoccurring indications may be performed as noted.  ABI Findings: +---------+------------------+-----+---------+--------+ Right    Rt Pressure (mmHg)IndexWaveform Comment  +---------+------------------+-----+---------+--------+ Brachial 144                                      +---------+------------------+-----+---------+--------+ ATA      149               1.03 triphasic         +---------+------------------+-----+---------+--------+ PTA      136               0.94 triphasic         +---------+------------------+-----+---------+--------+ Great Toe174               1.21 Normal            +---------+------------------+-----+---------+--------+ +---------+------------------+-----+---------+-------+ Left     Lt Pressure (mmHg)IndexWaveform Comment +---------+------------------+-----+---------+-------+ Brachial 140                                     +---------+------------------+-----+---------+-------+  ATA      146               1.01 triphasic        +---------+------------------+-----+---------+-------+ PTA      138               0.96 biphasic         +---------+------------------+-----+---------+-------+ Great Toe154               1.07 Normal           +---------+------------------+-----+---------+-------+ +-------+-----------+-----------+------------+------------+  ABI/TBIToday's ABIToday's TBIPrevious ABIPrevious TBI +-------+-----------+-----------+------------+------------+ Right  1.03       1.21       1.09        .91          +-------+-----------+-----------+------------+------------+ Left   1.01       1.07       .88         .80          +-------+-----------+-----------+------------+------------+ Left ABIs and TBIs appear increased compared to prior study on 06/2023.  Summary: Right: Resting right ankle-brachial index is within normal range. The right toe-brachial index is normal. Left: Resting left ankle-brachial index is within normal range. The left toe-brachial index is normal. *See table(s) above for measurements and observations.  Electronically signed by Festus Barren MD on 08/20/2023 at 7:29:35 AM.    Final        Assessment & Plan:   1. Peripheral arterial disease with history of revascularization [facility]) The patient is improving in terms of his mobility but he is still having pain.  This is not completely surprising.  He is also having some drainage and so we have cultured the wound area.  We gave him doxycycline until the cultures have returned.  He handled staple removal fairly well.  At the proximal portion we will have the patient placed Medihoney and change his dressings daily.  He will return in 2 weeks for wound reevaluation   Current Outpatient Medications on File Prior to Visit  Medication Sig Dispense Refill   albuterol (VENTOLIN HFA) 108 (90 Base) MCG/ACT inhaler USE 2 INHALATIONS EVERY 6 HOURS AS NEEDED FOR WHEEZING OR SHORTNESS OF BREATH 8.5 g 10   allopurinol (ZYLOPRIM) 100 MG tablet Take 200 mg by mouth daily.     ALPRAZolam (XANAX) 0.5 MG tablet Take 0.5 mg by mouth as needed.     atorvastatin (LIPITOR) 80 MG tablet TAKE 1 TABLET BY MOUTH EVERY DAY 90 tablet 3   cetirizine (ZYRTEC) 10 MG tablet Take 10 mg by mouth 2 (two) times daily.     clopidogrel (PLAVIX) 75 MG tablet Take 1 tablet (75 mg total) by mouth daily. 90  tablet 4   clotrimazole-betamethasone (LOTRISONE) cream APPLY 1 APPLICATION TOPICALLY DAILY 30 g 0   colchicine 0.6 MG tablet Take 0.6 mg by mouth 2 (two) times daily as needed.     Continuous Blood Gluc Sensor (FREESTYLE LIBRE 2 SENSOR) MISC USE TO CHECK GLUCOSE THREE TIMES A DAY 2 each 12   fluticasone (FLONASE) 50 MCG/ACT nasal spray Place 2 sprays into both nostrils daily. (Patient taking differently: Place 2 sprays into both nostrils daily as needed.) 16 g 6   Fluticasone Furoate (ARNUITY ELLIPTA) 100 MCG/ACT AEPB Inhale 1 puff into the lungs daily in the afternoon. 180 each 2   gabapentin (NEURONTIN) 300 MG capsule Take 1 capsule (300 mg total) by mouth daily.     glipiZIDE (GLUCOTROL XL) 10 MG 24  hr tablet Take 10 mg by mouth.     ketoconazole (NIZORAL) 2 % cream APPLY TOPICALLY TWICE A DAY AS NEEDED     Melatonin 10 MG CAPS Take 10 mg by mouth every evening.     Multiple Vitamin (MULTI-VITAMIN) tablet Take 1 tablet by mouth daily.     omeprazole (PRILOSEC) 40 MG capsule TAKE 1 CAPSULE (40 MG TOTAL) BY MOUTH IN THE MORNING AND AT BEDTIME. 180 capsule 1   OVER THE COUNTER MEDICATION Take 600 mg by mouth daily. Goli Ashwagandha Gummies     pioglitazone (ACTOS) 30 MG tablet Take 30 mg by mouth daily.     predniSONE (DELTASONE) 20 MG tablet Take 2 tablets (40 mg total) by mouth daily. 10 tablet 0   Probiotic Product (PROBIOTIC DAILY PO) Take 1 capsule by mouth daily.     Semaglutide, 2 MG/DOSE, (OZEMPIC, 2 MG/DOSE,) 8 MG/3ML SOPN Inject 2 mg into the skin once a week.     SYNJARDY XR 12.02-999 MG TB24 Take 2 tablets by mouth daily before breakfast.     traZODone (DESYREL) 100 MG tablet Take 100 mg by mouth at bedtime.     valsartan (DIOVAN) 320 MG tablet TAKE 1 TABLET BY MOUTH EVERY DAY 90 tablet 3   Blood Glucose Monitoring Suppl (ONETOUCH VERIO FLEX SYSTEM) w/Device KIT by Does not apply route. (Patient not taking: Reported on 07/27/2023)     ipratropium (ATROVENT) 0.06 % nasal spray Place  2 sprays into both nostrils 4 (four) times daily. (Patient not taking: Reported on 08/04/2023) 15 mL 12   OneTouch Delica Lancets 33G MISC by Does not apply route. (Patient not taking: Reported on 07/27/2023)     ONETOUCH VERIO test strip  (Patient not taking: Reported on 07/27/2023)     No current facility-administered medications on file prior to visit.    There are no Patient Instructions on file for this visit. No follow-ups on file.   Georgiana Spinner, NP

## 2023-08-30 NOTE — Progress Notes (Signed)
Patient spouse notified that culture results has been received and medication changes

## 2023-09-05 ENCOUNTER — Other Ambulatory Visit: Payer: Self-pay | Admitting: Family Medicine

## 2023-09-05 DIAGNOSIS — K219 Gastro-esophageal reflux disease without esophagitis: Secondary | ICD-10-CM

## 2023-09-05 MED ORDER — PANTOPRAZOLE SODIUM 40 MG PO TBEC: 40.0000 mg | DELAYED_RELEASE_TABLET | Freq: Every day | ORAL | 3 refills | Status: DC

## 2023-09-06 ENCOUNTER — Telehealth (INDEPENDENT_AMBULATORY_CARE_PROVIDER_SITE_OTHER): Payer: Self-pay

## 2023-09-06 ENCOUNTER — Other Ambulatory Visit (INDEPENDENT_AMBULATORY_CARE_PROVIDER_SITE_OTHER): Payer: Self-pay | Admitting: Nurse Practitioner

## 2023-09-06 DIAGNOSIS — M7989 Other specified soft tissue disorders: Secondary | ICD-10-CM

## 2023-09-06 DIAGNOSIS — I739 Peripheral vascular disease, unspecified: Secondary | ICD-10-CM

## 2023-09-06 NOTE — Telephone Encounter (Signed)
I suspect that isn't pus but fibrinous exudate (a byproduct of the wound trying to heal) based on her description.  As far as the foot being swollen, we can see if he can come in this week for just a DVT study.  Also, has he been wearing compression on his foot and elevating it?

## 2023-09-06 NOTE — Telephone Encounter (Signed)
Patients wife states that he has a visual stitch in the top part of his incision. She stated you told her that it may be a irritating it causing it not to heal. She would like for you to see it tomorrow when they come in for his ultrasound.   Patient hasn't been wearing compressions or elevating. She has been encouraging him to walk. I advised her to get compressions and removed them at night.  Please advise

## 2023-09-06 NOTE — Telephone Encounter (Signed)
Kevin Huerta called to inform you that Kevin Huerta has been getting better since the Abx, however the incision has a small opening that may have pus in it that has dried up. Also, his foot is swollen, tender, and can barely put weight on it.

## 2023-09-07 ENCOUNTER — Ambulatory Visit (INDEPENDENT_AMBULATORY_CARE_PROVIDER_SITE_OTHER): Payer: 59

## 2023-09-07 DIAGNOSIS — Z9889 Other specified postprocedural states: Secondary | ICD-10-CM | POA: Diagnosis not present

## 2023-09-07 DIAGNOSIS — I739 Peripheral vascular disease, unspecified: Secondary | ICD-10-CM | POA: Diagnosis not present

## 2023-09-07 DIAGNOSIS — M7989 Other specified soft tissue disorders: Secondary | ICD-10-CM | POA: Diagnosis not present

## 2023-09-08 ENCOUNTER — Encounter (INDEPENDENT_AMBULATORY_CARE_PROVIDER_SITE_OTHER): Payer: Self-pay

## 2023-09-08 ENCOUNTER — Other Ambulatory Visit (INDEPENDENT_AMBULATORY_CARE_PROVIDER_SITE_OTHER): Payer: Self-pay | Admitting: Nurse Practitioner

## 2023-09-08 MED ORDER — AMOXICILLIN-POT CLAVULANATE 875-125 MG PO TABS: 1.0000 | ORAL_TABLET | Freq: Two times a day (BID) | ORAL | 0 refills | Status: DC

## 2023-09-13 ENCOUNTER — Encounter (INDEPENDENT_AMBULATORY_CARE_PROVIDER_SITE_OTHER): Payer: Self-pay | Admitting: Nurse Practitioner

## 2023-09-13 ENCOUNTER — Ambulatory Visit (INDEPENDENT_AMBULATORY_CARE_PROVIDER_SITE_OTHER): Payer: 59 | Admitting: Nurse Practitioner

## 2023-09-13 VITALS — BP 125/81 | HR 79 | Resp 18 | Ht 69.5 in | Wt 248.8 lb

## 2023-09-13 DIAGNOSIS — Z9889 Other specified postprocedural states: Secondary | ICD-10-CM

## 2023-09-13 DIAGNOSIS — I739 Peripheral vascular disease, unspecified: Secondary | ICD-10-CM

## 2023-09-13 NOTE — Progress Notes (Signed)
Subjective:    Patient ID: Kevin Huerta, male    DOB: 11-20-1965, 57 y.o.   MRN: [id number] Chief Complaint  Patient presents with   Follow-up    per check out encounter - 2 weeks no studies    The patient is a 57 year old male following left femoral endarterectomy.  He recently had an ultrasound on his left lower extremity due to swelling which was negative for DVT.  The swelling has actually improved following him wearing compression and elevating his lower extremity.  He had a pico wound vacuum placed due to small opening and drainage.  Today that wound opening has closed but the more proximal wound remains open.  There is improvement however in the more proximal wound with less fibrinous exudate and a small amount was debrided today.  He is continuing to progress as he no longer requires use of assistive devices for walking.      Review of Systems  Skin:  Positive for wound.  All other systems reviewed and are negative.      Objective:   Physical Exam Vitals reviewed.  HENT:     Head: Normocephalic.  Cardiovascular:     Rate and Rhythm: Normal rate.     Pulses:          Dorsalis pedis pulses are 2+ on the left side.  Pulmonary:     Effort: Pulmonary effort is normal.  Musculoskeletal:     Left lower leg: No edema.  Skin:    General: Skin is warm and dry.  Neurological:     Mental Status: He is alert and oriented to person, place, and time.  Psychiatric:        Mood and Affect: Mood normal.        Behavior: Behavior normal.        Thought Content: Thought content normal.        Judgment: Judgment normal.     BP 125/81 (BP Location: Right Arm)   Pulse 79   Resp 18   Ht 5' 9.5" (1.765 m)   Wt 248 lb 12.8 oz (112.9 kg)   BMI 36.21 kg/m   Past Medical History:  Diagnosis Date   Allergic rhinitis, seasonal    Anginal equivalent (HCC)    Anxiety    a.) on BZO (alprazolam) PRN   Aortic atherosclerosis (HCC)    Arthritis    Asthma    Atherosclerosis of  native artery of both lower extremities with intermittent claudication (HCC)    a.) s/p PTA of RIGHT prox peroneal, prox SFA, popliteal, and distal SFA; 6 mm x 15 cm Viabahn stent to RIGHT distal SFA and above knee popliteal artery; b.) s/p unsuccessful PTA of occluded SFA 03/22/2023 --> plans for endarterectomy +/-  endoluminal intervention   BRBPR (bright red blood per rectum) 05/24/2020   Chest heaviness 10/27/2022   Coronary artery disease of native artery of native heart with stable angina pectoris (HCC) 03/29/2023   a.) cCTA 03/29/2023: Ca2+ 476 (95th percentile for age/sex/race matched control)   Current vaping on some days    DDD (degenerative disc disease), cervical    a.) s/p ACDF C3-C5 2014   Dermatitis 06/02/2021   Diarrhea 02/26/2021   Emphysema lung (HCC)    Fatty liver 09/03/2020   GERD (gastroesophageal reflux disease)    Gout    Heart murmur    History of cardiac catheterization 11/27/2008   a.) LHC 11/27/2008 at [facility] --> normal coronary anatomy with no obstructive  CAD   Hyperlipidemia    Hypersomnia 07/17/2014   Hypertension    Insomnia    a.) uses melatonin +/- trazodone PRN   Leg cramps 02/26/2021   Marijuana use, episodic    Occasional alcohol consumption    On long term clopidogrel therapy    OSA (obstructive sleep apnea)    a.) not currently on nocturnal PAP therapy; needs repeat PSG   Seasonal allergies    T2DM (type 2 diabetes mellitus) (HCC)    a.) monitors with Freestyle Libre CGM   Tinnitus     Social History   Socioeconomic History   Marital status: Married    Spouse name: leslie   Number of children: 3   Years of education: Not on file   Highest education level: Not on file  Occupational History   Occupation: IT   Tobacco Use   Smoking status: Former    Current packs/day: 0.00    Average packs/day: 1.5 packs/day for 32.0 years (48.0 ttl pk-yrs)    Types: Cigarettes, E-cigarettes    Start date: 06/11/1986    Quit date:  06/11/2018    Years since quitting: 5.2   Smokeless tobacco: Former    Types: Snuff    Quit date: 1996   Tobacco comments:    I vape weekly."  Vaping Use   Vaping status: Some Days   Substances: Nicotine   Devices: x3 per day to quit smoking nicotine   Substance and Sexual Activity   Alcohol use: Yes    Comment: cut back, 6 pack per month   Drug use: Not Currently    Types: Marijuana    Comment: a few times a year   Sexual activity: Yes    Partners: Female    Birth control/protection: None  Other Topics Concern   Not on file  Social History Narrative   10/15/20   From: [place] originally, but in the area since 2000   Living: with wife, Verlon Au (2003) and granddaughter   Work: Scientist, 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[employer]      Family: 3 daughters - Irish Lack, Toni Amend (just finished law school), [place], and granddaughter McKenzie (2011)      Enjoys: travel when able, beach, cycling      Exercise: exercise bike at home   Diet: does not follow diabetic diet, lower appetite with ozempic      Safety   Seat belts: Yes    Guns: Yes  and secure   Safe in relationships: Yes    Social Determinants of Health   Financial Resource Strain: Low Risk  (10/07/2020)   Overall Financial Resource Strain (CARDIA)    Difficulty of Paying Living Expenses: Not hard at all  Food Insecurity: No Food Insecurity (08/05/2023)   Hunger Vital Sign    Worried About Running Out of Food in the Last Year: Never true    Ran Out of Food in the Last Year: Never true  Transportation Needs: No Transportation Needs (08/05/2023)   PRAPARE - Administrator, Civil Service (Medical): No    Lack of Transportation (Non-Medical): No  Physical Activity: Insufficiently Active (03/11/2018)   Received from [facility]   Exercise Vital Sign    Days of Exercise per Week: 1 day    Minutes of Exercise per Session: 20 min  Stress: No Stress Concern Present (03/11/2018)   Received from [facility], [facility] Occupational Health - Occupational Stress Questionnaire    Feeling of  Stress : Only a little  Social Connections: Unknown (02/13/2022)   Received from [facility], [facility]   Social Network    Social Network: Not on file  Intimate Partner Violence: Not At Risk (08/05/2023)   Humiliation, Afraid, Rape, and Kick questionnaire    Fear of Current or Ex-Partner: No    Emotionally Abused: No    Physically Abused: No    Sexually Abused: No    Past Surgical History:  Procedure Laterality Date   ANTERIOR CERVICAL DECOMP/DISCECTOMY FUSION N/A 2014   ENDARTERECTOMY FEMORAL Left 08/04/2023   Procedure: ENDARTERECTOMY FEMORAL (SFA STENT PLACEMENT);  Surgeon: Annice Needy, MD;  Location: [facility] ORS;  Service: Vascular;  Laterality: Left;   KNEE SURGERY Right    meniscus   LEFT HEART CATH AND CORONARY ANGIOGRAPHY Left 11/27/2008   Procedure: LEFT HEART CATH AND CORONARY ANGIOGRAPHY; Location: [facility]; Surgeon: Shari Prows, MD   LOWER EXTREMITY ANGIOGRAPHY Right 11/16/2022   Procedure: Lower Extremity Angiography;  Surgeon: Annice Needy, MD;  Location: [facility] INVASIVE CV LAB;  Service: Cardiovascular;  Laterality: Right;   LOWER EXTREMITY ANGIOGRAPHY Left 03/22/2023   Procedure: Lower Extremity Angiography;  Surgeon: Annice Needy, MD;  Location: [facility] INVASIVE CV LAB;  Service: Cardiovascular;  Laterality: Left;   ROTATOR CUFF REPAIR Right    WISDOM TOOTH EXTRACTION      Family History  Problem Relation Age of Onset   Heart attack Father 82   Alcohol abuse Father    Drug abuse Father    Heart disease Father    Lung cancer Father    Hypertension Mother    Heart attack Maternal Grandmother 31   Stroke Maternal Aunt 55    Allergies  Allergen Reactions   Aspirin Tinitus       Latest Ref Rng & Units 08/05/2023   12:19 AM 08/04/2023    2:59 PM 07/27/2023   11:29 AM  CBC  WBC 4.0 - 10.5 K/uL 9.0  7.5  4.2   Hemoglobin 13.0  - 17.0 g/dL 16.1  09.6  04.5   Hematocrit 39.0 - 52.0 % 35.0  37.6  44.9   Platelets 150 - 400 K/uL 209  218  257       CMP     Component Value Date/Time   NA 134 (L) 08/05/2023 0019   K 3.5 08/05/2023 0019   CL 102 08/05/2023 0019   CO2 22 08/05/2023 0019   GLUCOSE 241 (H) 08/05/2023 0019   BUN 14 08/05/2023 0019   CREATININE 0.86 08/05/2023 0019   CALCIUM 8.5 (L) 08/05/2023 0019   PROT 8.1 07/27/2023 1129   ALBUMIN 4.6 07/27/2023 1129   AST 21 07/27/2023 1129   ALT 29 07/27/2023 1129   ALKPHOS 67 07/27/2023 1129   BILITOT 0.6 07/27/2023 1129   GFR 83.22 10/27/2022 0948   EGFR 7.9 02/17/2022 0000   GFRNONAA >60 08/05/2023 0019     VAS [place] ABI WITH/WO TBI  Result Date: 08/20/2023  LOWER EXTREMITY DOPPLER STUDY Patient Name:  Corris Cochrane  Date of Exam:   08/17/2023 Medical Rec #: [id number]       Accession #:    [id number] Date of Birth: 12-19-1965       Patient Gender: M Patient Age:   74 years Exam Location:  [facility] Procedure:      VAS [place] ABI WITH/WO TBI Referring Phys: --------------------------------------------------------------------------------  Indications: Claudication, and peripheral artery disease.  Vascular Interventions: 11/16/2022: Aotogram and  Selective Right Lower Extremity                         Angiogram. PTAof the Right Proximal Peroneal Artery,                         Tibioperoneal trunk with 3 mm diameter angioplasty                         balloon. PTA of the Right CFA and Proximal SFA with 5 mm                         diameter Lutonix drug coated angioplasty balloon. PTA of                         the Right Popliteal Artery and Distal SFA with 4 mm                         diameter by 15 cm length Lutonix drug coated angioplasty                         balloon. Viabahnstent placement to the Right most distal                         SFA and above knee Popliteal Artery with 6 mm diameter                         by 15 cm length Viabahn stent for  greater than 50%                         residual stenosis after angioplasty.                          03/22/2023: Aortogram and Selective Left Lower Extremity                         Angiogram. Comparison Study: 06/28/2023 Performing Technologist: Salvadore Farber RVT  Examination Guidelines: A complete evaluation includes at minimum, Doppler waveform signals and systolic blood pressure reading at the level of bilateral brachial, anterior tibial, and posterior tibial arteries, when vessel segments are accessible. Bilateral testing is considered an integral part of a complete examination. Photoelectric Plethysmograph (PPG) waveforms and toe systolic pressure readings are included as required and additional duplex testing as needed. Limited examinations for reoccurring indications may be performed as noted.  ABI Findings: +---------+------------------+-----+---------+--------+ Right    Rt Pressure (mmHg)IndexWaveform Comment  +---------+------------------+-----+---------+--------+ Brachial 144                                      +---------+------------------+-----+---------+--------+ ATA      149               1.03 triphasic         +---------+------------------+-----+---------+--------+ PTA      136               0.94 triphasic         +---------+------------------+-----+---------+--------+ Lester Morrill  1.21 Normal            +---------+------------------+-----+---------+--------+ +---------+------------------+-----+---------+-------+ Left     Lt Pressure (mmHg)IndexWaveform Comment +---------+------------------+-----+---------+-------+ Brachial 140                                     +---------+------------------+-----+---------+-------+ ATA      146               1.01 triphasic        +---------+------------------+-----+---------+-------+ PTA      138               0.96 biphasic         +---------+------------------+-----+---------+-------+ Great  Toe154               1.07 Normal           +---------+------------------+-----+---------+-------+ +-------+-----------+-----------+------------+------------+ ABI/TBIToday's ABIToday's TBIPrevious ABIPrevious TBI +-------+-----------+-----------+------------+------------+ Right  1.03       1.21       1.09        .91          +-------+-----------+-----------+------------+------------+ Left   1.01       1.07       .88         .80          +-------+-----------+-----------+------------+------------+ Left ABIs and TBIs appear increased compared to prior study on 06/2023.  Summary: Right: Resting right ankle-brachial index is within normal range. The right toe-brachial index is normal. Left: Resting left ankle-brachial index is within normal range. The left toe-brachial index is normal. *See table(s) above for measurements and observations.  Electronically signed by Festus Barren MD on 08/20/2023 at 7:29:35 AM.    Final        Assessment & Plan:   1. Peripheral arterial disease with history of revascularization [facility]) The patient is improving in terms of his mobility and pain.  The more distal wounds are improving but he continues to have a small area of fibrinous exudate at the proximal portion.  With the debridement that was done and revealed good granulated tissue.  Will continue with use of Medihoney and Xeroform.  Will have the patient return in approximately 3 weeks due to the holiday.  He may go out of town so he is advised to contact us for antibiotics in case there is issues.    Current Outpatient Medications on File Prior to Visit  Medication Sig Dispense Refill   albuterol (VENTOLIN HFA) 108 (90 Base) MCG/ACT inhaler USE 2 INHALATIONS EVERY 6 HOURS AS NEEDED FOR WHEEZING OR SHORTNESS OF BREATH 8.5 g 10   allopurinol (ZYLOPRIM) 100 MG tablet Take 200 mg by mouth daily.     ALPRAZolam (XANAX) 0.5 MG tablet Take 0.5 mg by mouth as needed.     amoxicillin-clavulanate (AUGMENTIN)  875-125 MG tablet Take 1 tablet by mouth 2 (two) times daily. 14 tablet 0   atorvastatin (LIPITOR) 80 MG tablet TAKE 1 TABLET BY MOUTH EVERY DAY 90 tablet 3   Blood Glucose Monitoring Suppl (ONETOUCH VERIO FLEX SYSTEM) w/Device KIT by Does not apply route.     cetirizine (ZYRTEC) 10 MG tablet Take 10 mg by mouth 2 (two) times daily.     clopidogrel (PLAVIX) 75 MG tablet Take 1 tablet (75 mg total) by mouth daily. 90 tablet 4   clotrimazole-betamethasone (LOTRISONE) cream APPLY 1 APPLICATION TOPICALLY DAILY 30 g 0   colchicine 0.6 MG tablet Take  0.6 mg by mouth 2 (two) times daily as needed.     Continuous Blood Gluc Sensor (FREESTYLE LIBRE 2 SENSOR) MISC USE TO CHECK GLUCOSE THREE TIMES A DAY 2 each 12   cyclobenzaprine (FLEXERIL) 5 MG tablet Take 1 tablet (5 mg total) by mouth 3 (three) times daily as needed for muscle spasms. 30 tablet 0   fluticasone (FLONASE) 50 MCG/ACT nasal spray Place 2 sprays into both nostrils daily. (Patient taking differently: Place 2 sprays into both nostrils daily as needed.) 16 g 6   Fluticasone Furoate (ARNUITY ELLIPTA) 100 MCG/ACT AEPB Inhale 1 puff into the lungs daily in the afternoon. 180 each 2   gabapentin (NEURONTIN) 300 MG capsule Take 1 capsule (300 mg total) by mouth daily.     glipiZIDE (GLUCOTROL XL) 10 MG 24 hr tablet Take 10 mg by mouth.     ipratropium (ATROVENT) 0.06 % nasal spray Place 2 sprays into both nostrils 4 (four) times daily. 15 mL 12   ketoconazole (NIZORAL) 2 % cream APPLY TOPICALLY TWICE A DAY AS NEEDED     leptospermum manuka honey (MEDIHONEY) PSTE paste Apply 1 Application topically daily. 90 mL 2   Melatonin 10 MG CAPS Take 10 mg by mouth every evening.     Multiple Vitamin (MULTI-VITAMIN) tablet Take 1 tablet by mouth daily.     OneTouch Delica Lancets 33G MISC by Does not apply route.     ONETOUCH VERIO test strip      OVER THE COUNTER MEDICATION Take 600 mg by mouth daily. Goli Ashwagandha Gummies     oxyCODONE (OXY  IR/ROXICODONE) 5 MG immediate release tablet Take 1-2 tablets (5-10 mg total) by mouth every 6 (six) hours as needed for moderate pain (pain score 4-6) or severe pain (pain score 7-10). 30 tablet 0   pantoprazole (PROTONIX) 40 MG tablet Take 1 tablet (40 mg total) by mouth daily. 90 tablet 3   pioglitazone (ACTOS) 30 MG tablet Take 30 mg by mouth daily.     predniSONE (DELTASONE) 20 MG tablet Take 2 tablets (40 mg total) by mouth daily. 10 tablet 0   Probiotic Product (PROBIOTIC DAILY PO) Take 1 capsule by mouth daily.     Semaglutide, 2 MG/DOSE, (OZEMPIC, 2 MG/DOSE,) 8 MG/3ML SOPN Inject 2 mg into the skin once a week.     SYNJARDY XR 12.02-999 MG TB24 Take 2 tablets by mouth daily before breakfast.     traZODone (DESYREL) 100 MG tablet Take 100 mg by mouth at bedtime.     valsartan (DIOVAN) 320 MG tablet TAKE 1 TABLET BY MOUTH EVERY DAY 90 tablet 3   No current facility-administered medications on file prior to visit.    There are no Patient Instructions on file for this visit. No follow-ups on file.   Georgiana Spinner, NP

## 2023-09-14 ENCOUNTER — Encounter (INDEPENDENT_AMBULATORY_CARE_PROVIDER_SITE_OTHER): Payer: Self-pay

## 2023-09-16 ENCOUNTER — Other Ambulatory Visit (INDEPENDENT_AMBULATORY_CARE_PROVIDER_SITE_OTHER): Payer: Self-pay | Admitting: Nurse Practitioner

## 2023-09-16 MED ORDER — HYDROCODONE-ACETAMINOPHEN 5-325 MG PO TABS: 1.0000 | ORAL_TABLET | Freq: Four times a day (QID) | ORAL | 0 refills | Status: DC | PRN

## 2023-09-20 ENCOUNTER — Encounter (INDEPENDENT_AMBULATORY_CARE_PROVIDER_SITE_OTHER): Payer: Self-pay

## 2023-09-21 ENCOUNTER — Other Ambulatory Visit (INDEPENDENT_AMBULATORY_CARE_PROVIDER_SITE_OTHER): Payer: Self-pay | Admitting: Nurse Practitioner

## 2023-09-21 MED ORDER — AMOXICILLIN-POT CLAVULANATE 875-125 MG PO TABS: 1.0000 | ORAL_TABLET | Freq: Two times a day (BID) | ORAL | 0 refills | Status: DC

## 2023-10-12 ENCOUNTER — Ambulatory Visit (INDEPENDENT_AMBULATORY_CARE_PROVIDER_SITE_OTHER): Payer: 59 | Admitting: Vascular Surgery

## 2023-10-12 ENCOUNTER — Encounter (INDEPENDENT_AMBULATORY_CARE_PROVIDER_SITE_OTHER): Payer: Self-pay | Admitting: Nurse Practitioner

## 2023-10-12 VITALS — BP 135/82 | HR 86 | Resp 16 | Wt 245.4 lb

## 2023-10-12 DIAGNOSIS — I152 Hypertension secondary to endocrine disorders: Secondary | ICD-10-CM

## 2023-10-12 DIAGNOSIS — E119 Type 2 diabetes mellitus without complications: Secondary | ICD-10-CM

## 2023-10-12 DIAGNOSIS — E1159 Type 2 diabetes mellitus with other circulatory complications: Secondary | ICD-10-CM

## 2023-10-12 DIAGNOSIS — I70229 Atherosclerosis of native arteries of extremities with rest pain, unspecified extremity: Secondary | ICD-10-CM

## 2023-10-12 NOTE — Progress Notes (Signed)
 Patient ID: Kevin Huerta, male   DOB: 05-Mar-1966, 58 y.o.   MRN: [id number]  Chief Complaint  Patient presents with   Follow-up    Wound check    HPI Kevin Huerta is a 58 y.o. male.  Patient returns in follow-up.  He is a little over 2 months status post left femoral endarterectomy and left SFA stent placement.  His wound has finally completely healed in the left groin.  His swelling is gone down to very minimal on the left leg.  His pain is markedly improved.  He does still have some numbness and discomfort on the inner aspect of the left thigh but this is gradually improving as well.  All in all, he seems to be significantly improved.   Past Medical History:  Diagnosis Date   Allergic rhinitis, seasonal    Anginal equivalent (HCC)    Anxiety    a.) on BZO (alprazolam ) PRN   Aortic atherosclerosis (HCC)    Arthritis    Asthma    Atherosclerosis of native artery of both lower extremities with intermittent claudication (HCC)    a.) s/p PTA of RIGHT prox peroneal, prox SFA, popliteal, and distal SFA; 6 mm x 15 cm Viabahn stent to RIGHT distal SFA and above knee popliteal artery; b.) s/p unsuccessful PTA of occluded SFA 03/22/2023 --> plans for endarterectomy +/-  endoluminal intervention   BRBPR (bright red blood per rectum) 05/24/2020   Chest heaviness 10/27/2022   Coronary artery disease of native artery of native heart with stable angina pectoris (HCC) 03/29/2023   a.) cCTA 03/29/2023: Ca2+ 476 (95th percentile for age/sex/race matched control)   Current vaping on some days    DDD (degenerative disc disease), cervical    a.) s/p ACDF C3-C5 2014   Dermatitis 06/02/2021   Diarrhea 02/26/2021   Emphysema lung (HCC)    Fatty liver 09/03/2020   GERD (gastroesophageal reflux disease)    Gout    Heart murmur    History of cardiac catheterization 11/27/2008   a.) LHC 11/27/2008 at [facility] --> normal coronary anatomy with no obstructive CAD   Hyperlipidemia     Hypersomnia 07/17/2014   Hypertension    Insomnia    a.) uses melatonin +/- trazodone  PRN   Leg cramps 02/26/2021   Marijuana use, episodic    Occasional alcohol consumption    On long term clopidogrel  therapy    OSA (obstructive sleep apnea)    a.) not currently on nocturnal PAP therapy; needs repeat PSG   Seasonal allergies    T2DM (type 2 diabetes mellitus) (HCC)    a.) monitors with Freestyle Libre CGM   Tinnitus     Past Surgical History:  Procedure Laterality Date   ANTERIOR CERVICAL DECOMP/DISCECTOMY FUSION N/A 2014   ENDARTERECTOMY FEMORAL Left 08/04/2023   Procedure: ENDARTERECTOMY FEMORAL (SFA STENT PLACEMENT);  Surgeon: Marea Selinda RAMAN, MD;  Location: [facility] ORS;  Service: Vascular;  Laterality: Left;   KNEE SURGERY Right    meniscus   LEFT HEART CATH AND CORONARY ANGIOGRAPHY Left 11/27/2008   Procedure: LEFT HEART CATH AND CORONARY ANGIOGRAPHY; Location: [facility]; Surgeon: Donato Muir, MD   LOWER EXTREMITY ANGIOGRAPHY Right 11/16/2022   Procedure: Lower Extremity Angiography;  Surgeon: Marea Selinda RAMAN, MD;  Location: [facility] INVASIVE CV LAB;  Service: Cardiovascular;  Laterality: Right;   LOWER EXTREMITY ANGIOGRAPHY Left 03/22/2023   Procedure: Lower Extremity Angiography;  Surgeon: Marea Selinda RAMAN, MD;  Location: [facility] INVASIVE CV LAB;  Service: Cardiovascular;  Laterality: Left;   ROTATOR CUFF REPAIR Right    WISDOM TOOTH EXTRACTION        Allergies  Allergen Reactions   Aspirin  Tinitus    Current Outpatient Medications  Medication Sig Dispense Refill   albuterol  (VENTOLIN  HFA) 108 (90 Base) MCG/ACT inhaler USE 2 INHALATIONS EVERY 6 HOURS AS NEEDED FOR WHEEZING OR SHORTNESS OF BREATH 8.5 g 10   allopurinol  (ZYLOPRIM ) 100 MG tablet Take 200 mg by mouth daily.     ALPRAZolam  (XANAX ) 0.5 MG tablet Take 0.5 mg by mouth as needed.     atorvastatin  (LIPITOR) 80 MG tablet TAKE 1 TABLET BY MOUTH EVERY DAY 90 tablet 3   Blood Glucose Monitoring Suppl  (ONETOUCH VERIO FLEX SYSTEM) w/Device KIT by Does not apply route.     cetirizine (ZYRTEC) 10 MG tablet Take 10 mg by mouth 2 (two) times daily.     clopidogrel  (PLAVIX ) 75 MG tablet Take 1 tablet (75 mg total) by mouth daily. 90 tablet 4   clotrimazole -betamethasone  (LOTRISONE ) cream APPLY 1 APPLICATION TOPICALLY DAILY 30 g 0   colchicine 0.6 MG tablet Take 0.6 mg by mouth 2 (two) times daily as needed.     Continuous Blood Gluc Sensor (FREESTYLE LIBRE 2 SENSOR) MISC USE TO CHECK GLUCOSE THREE TIMES A DAY 2 each 12   cyclobenzaprine  (FLEXERIL ) 5 MG tablet Take 1 tablet (5 mg total) by mouth 3 (three) times daily as needed for muscle spasms. 30 tablet 0   fluticasone  (FLONASE ) 50 MCG/ACT nasal spray Place 2 sprays into both nostrils daily. (Patient taking differently: Place 2 sprays into both nostrils daily as needed.) 16 g 6   Fluticasone  Furoate (ARNUITY ELLIPTA ) 100 MCG/ACT AEPB Inhale 1 puff into the lungs daily in the afternoon. 180 each 2   gabapentin  (NEURONTIN ) 300 MG capsule Take 1 capsule (300 mg total) by mouth daily.     glipiZIDE  (GLUCOTROL  XL) 10 MG 24 hr tablet Take 10 mg by mouth.     ipratropium (ATROVENT ) 0.06 % nasal spray Place 2 sprays into both nostrils 4 (four) times daily. 15 mL 12   ketoconazole  (NIZORAL ) 2 % cream APPLY TOPICALLY TWICE A DAY AS NEEDED     leptospermum manuka honey (MEDIHONEY) PSTE paste Apply 1 Application topically daily. 90 mL 2   Melatonin 10 MG CAPS Take 10 mg by mouth every evening.     Multiple Vitamin (MULTI-VITAMIN) tablet Take 1 tablet by mouth daily.     OneTouch Delica Lancets 33G MISC by Does not apply route.     ONETOUCH VERIO test strip      OVER THE COUNTER MEDICATION Take 600 mg by mouth daily. Goli Ashwagandha Gummies     pantoprazole  (PROTONIX ) 40 MG tablet Take 1 tablet (40 mg total) by mouth daily. 90 tablet 3   pioglitazone  (ACTOS ) 30 MG tablet Take 30 mg by mouth daily.     Probiotic Product (PROBIOTIC DAILY PO) Take 1 capsule by  mouth daily.     Semaglutide, 2 MG/DOSE, (OZEMPIC, 2 MG/DOSE,) 8 MG/3ML SOPN Inject 2 mg into the skin once a week.     SYNJARDY  XR 12.02-999 MG TB24 Take 2 tablets by mouth daily before breakfast.     traZODone  (DESYREL ) 100 MG tablet Take 100 mg by mouth at bedtime.     valsartan  (DIOVAN ) 320 MG tablet TAKE 1 TABLET BY MOUTH EVERY DAY 90 tablet 3   amoxicillin -clavulanate (AUGMENTIN ) 875-125 MG tablet Take 1 tablet by mouth 2 (  two) times daily. 14 tablet 0   HYDROcodone -acetaminophen  (NORCO/VICODIN) 5-325 MG tablet Take 1 tablet by mouth every 6 (six) hours as needed for moderate pain (pain score 4-6). 20 tablet 0   predniSONE  (DELTASONE ) 20 MG tablet Take 2 tablets (40 mg total) by mouth daily. 10 tablet 0   No current facility-administered medications for this visit.        Physical Exam BP 135/82   Pulse 86   Resp 16   Wt 245 lb 6.4 oz (111.3 kg)   BMI 35.72 kg/m  Gen:  WD/WN, NAD Skin: incision C/D/I Ext:  swelling now minimal in the LLE, palpable posterior tibial pulse.     Assessment/Plan:  Atherosclerosis of artery of extremity with rest pain (HCC) Symptoms much improved after revascularization.  Wound has now healed and overall he is doing well.  Follow-up in 2 to 3 months with ABIs.  Hypertension associated with diabetes (HCC) blood pressure control important in reducing the progression of atherosclerotic disease. On appropriate oral medications.   Type 2 diabetes mellitus without complication, without long-term current use of insulin  (HCC) blood glucose control important in reducing the progression of atherosclerotic disease. Also, involved in wound healing. On appropriate medications.      Selinda Gu 10/12/2023, 9:25 AM   This note was created with Dragon medical transcription system.  Any errors from dictation are unintentional.

## 2023-10-12 NOTE — Assessment & Plan Note (Signed)
 blood pressure control important in reducing the progression of atherosclerotic disease. On appropriate oral medications.

## 2023-10-12 NOTE — Assessment & Plan Note (Signed)
 blood glucose control important in reducing the progression of atherosclerotic disease. Also, involved in wound healing. On appropriate medications.

## 2023-10-12 NOTE — Assessment & Plan Note (Signed)
 Symptoms much improved after revascularization.  Wound has now healed and overall he is doing well.  Follow-up in 2 to 3 months with ABIs.

## 2023-11-08 LAB — HM DIABETES EYE EXAM

## 2023-11-09 ENCOUNTER — Telehealth: Payer: Self-pay | Admitting: Pulmonary Disease

## 2023-11-09 ENCOUNTER — Encounter: Payer: Self-pay | Admitting: Family Medicine

## 2023-11-10 ENCOUNTER — Encounter: Payer: Self-pay | Admitting: Family Medicine

## 2023-11-10 ENCOUNTER — Telehealth: Payer: 59 | Admitting: Family Medicine

## 2023-11-10 ENCOUNTER — Ambulatory Visit: Payer: Self-pay | Admitting: Family Medicine

## 2023-11-10 ENCOUNTER — Telehealth: Payer: Self-pay | Admitting: Pulmonary Disease

## 2023-11-10 ENCOUNTER — Telehealth: Payer: Self-pay

## 2023-11-10 VITALS — BP 156/74 | HR 80 | Temp 97.5°F

## 2023-11-10 DIAGNOSIS — U071 COVID-19: Secondary | ICD-10-CM | POA: Insufficient documentation

## 2023-11-10 MED ORDER — MOLNUPIRAVIR EUA 200MG CAPSULE: 4.0000 | ORAL_CAPSULE | Freq: Two times a day (BID) | ORAL | 0 refills | Status: AC

## 2023-11-10 MED ORDER — GUAIFENESIN-CODEINE 100-10 MG/5ML PO SOLN: 10.0000 mL | Freq: Three times a day (TID) | ORAL | 0 refills | Status: DC | PRN

## 2023-11-10 NOTE — Telephone Encounter (Signed)
 Copied from CRM [id number]. Topic: Clinical - Red Word Triage >> Nov 10, 2023  8:25 AM Leila C wrote: Red Word that prompted transfer to Nurse Triage: Patient's spouse Sonny [phone], patient is positive for Covid. Symptoms are sore throat, coughing- painful, scrapping and burning sensation, chest and nasal congestion, chills, hard to take deep breath, fatigue. Patient does not have dizziness, fever or wheezing.   Chief Complaint: cough Symptoms: chills sore through, burning in chest Frequency: ongoing since Sunday Pertinent Negatives: Patient denies shortness of breath Disposition: [] ED /[] Urgent Care (no appt availability in office) / [x] Appointment(In office/virtual)/ []  [place] [facility]/ [] Home Care/ [] Refused Recommended Disposition /[] [facility]/ []  Follow-up with PCP Additional Notes: The patient had a positive home Covid test yesterday 11/09/2023.  He called his on call pulmonologist who advised that he call his pcp for a Paxlovid prescription request.  Please see note from on call provider.  The patient has a history of asthma and smoking. He has a worsening cough, chills, sore throat. He has chest pain described as burning in his chest only when he coughs. He denied shortness of breath.  He was scheduled for a virtual appointment this morning for further assessment.  Reason for Disposition  [1] HIGH RISK patient (e.g., weak immune system, age > 64 years, obesity with BMI 30 or higher, pregnant, chronic lung disease or other chronic medical condition) AND [2] COVID symptoms (e.g., cough, fever)  (Exceptions: Already seen by PCP and no new or worsening symptoms.)  Answer Assessment - Initial Assessment Questions 1. COVID-19 DIAGNOSIS: How do you know that you have COVID? (e.g., positive lab test or self-test, diagnosed by doctor or NP/PA, symptoms after exposure).     Positive home test  3. ONSET: When did the COVID-19 symptoms start?      Sunday  4. WORST  SYMPTOM: What is your worst symptom? (e.g., cough, fever, shortness of breath, muscle aches)     Cough and chest discomfort -painful with cough feels like burning sensation 5. COUGH: Do you have a cough? If Yes, ask: How bad is the cough?       Progressively worse 6. FEVER: Do you have a fever? If Yes, ask: What is your temperature, how was it measured, and when did it start?     No fever  7. RESPIRATORY STATUS: Describe your breathing? (e.g., normal; shortness of breath, wheezing, unable to speak)      Denied shortness of breath or wheezing  8. BETTER-SAME-WORSE: Are you getting better, staying the same or getting worse compared to yesterday?  If getting worse, ask, In what way?     Worse  9. OTHER SYMPTOMS: Do you have any other symptoms?  (e.g., chills, fatigue, headache, loss of smell or taste, muscle pain, sore throat)     Sore throat, chills  10. HIGH RISK DISEASE: Do you have any chronic medical problems? (e.g., asthma, heart or lung disease, weak immune system, obesity, etc.)       Asthma, heart murmur, previous smoker, asthma, PAD 11. VACCINE: Have you had the COVID-19 vaccine? If Yes, ask: Which one, how many shots, when did you get it?       Vaccinated September 2024  13. O2 SATURATION MONITOR:  Do you use an oxygen saturation monitor (pulse oximeter) at home? If Yes, ask What is your reading (oxygen level) today? What is your usual oxygen saturation reading? (e.g., 95%)       Unsure does not own pulse ox  Protocols used: Coronavirus (COVID-19) Diagnosed or Suspected-A-AH

## 2023-11-10 NOTE — Telephone Encounter (Signed)
Pt scheduled with Dr Caryl Bis

## 2023-11-10 NOTE — Telephone Encounter (Signed)
 E2C2 Triage Nurse called to state patient's wife wanted to verify that patient does need to have this appointment today since they spoke with the on-call doctor last night to request paxlovid and they agreed.  I let E2C2 Triage Nurse know that patient's must have a visit with us  before we prescribe medications.  E2C2 Triage Nurse states she will relay the message to patient's wife and ask her to please do e-check-in for visit and set up card to be charged since we are so close to time for patient's virtual visit.

## 2023-11-10 NOTE — Assessment & Plan Note (Addendum)
 Patient with positive home COVID test and COVID symptoms and systemic symptoms with chills.  He is at high risk for progression to severe illness.  We will treat with molnupiravir  given that he cannot take Paxlovid as he is on Plavix .  We will prescribe codeine  cough syrup for him to use for his cough.  Discussed the risk of drowsiness with this.  Advised that he should not take his Xanax  when he is taking the cough medication.  Discussed if he is excessively drowsy with a cough medication he needs to discontinue use and let us  know.  Advised he should not drive on the cough medication.  Discussed staying home until he is feeling better for at least 24 hours and has been afebrile and without chills for at least 24 hours without use of Tylenol .  Discussed he could use Tylenol  for body aches and fevers.  If not improving he will let us  know.  If you develop significant shortness of breath he will seek medical attention in the emergency room.

## 2023-11-10 NOTE — Progress Notes (Signed)
 Virtual Visit via video Note  I connected with Kevin Huerta today at  9:00 AM EST by a video enabled telemedicine application or telephone and verified that I am speaking with the correct person using two identifiers. Location patient: home Location provider: work  Persons participating in the virtual visit: patient, provider  I discussed the limitations, risks, security and privacy concerns of performing an evaluation and management service by telephone and the availability of in person appointments. I also discussed with the patient that there may be a patient responsible charge related to this service. The patient expressed understanding and agreed to proceed.  Reason for visit: COVID  HPI: COVID-19: Onset of symptoms 4 days ago with sore throat.  Feels like things are generally stable and not getting worse though are not getting better.  Cough- yes, started monday  Congestion- yes   Sinus- yes   Chest- yes  Shortness of breath- feels need to take deep breath, has used his albuterol  inhaler with good benefit  Fever-no though has had chills  Covid exposure- wife  Medications- delsym, vitamin C  Notably patient is on Plavix  and cannot take Paxlovid.    ROS: See pertinent positives and negatives per HPI.  Past Medical History:  Diagnosis Date   Allergic rhinitis, seasonal    Anginal equivalent (HCC)    Anxiety    a.) on BZO (alprazolam ) PRN   Aortic atherosclerosis (HCC)    Arthritis    Asthma    Atherosclerosis of native artery of both lower extremities with intermittent claudication (HCC)    a.) s/p PTA of RIGHT prox peroneal, prox SFA, popliteal, and distal SFA; 6 mm x 15 cm Viabahn stent to RIGHT distal SFA and above knee popliteal artery; b.) s/p unsuccessful PTA of occluded SFA 03/22/2023 --> plans for endarterectomy +/-  endoluminal intervention   BRBPR (bright red blood per rectum) 05/24/2020   Chest heaviness 10/27/2022   Coronary artery disease of native artery  of native heart with stable angina pectoris (HCC) 03/29/2023   a.) cCTA 03/29/2023: Ca2+ 476 (95th percentile for age/sex/race matched control)   Current vaping on some days    DDD (degenerative disc disease), cervical    a.) s/p ACDF C3-C5 2014   Dermatitis 06/02/2021   Diarrhea 02/26/2021   Emphysema lung (HCC)    Fatty liver 09/03/2020   GERD (gastroesophageal reflux disease)    Gout    Heart murmur    History of cardiac catheterization 11/27/2008   a.) LHC 11/27/2008 at [facility] --> normal coronary anatomy with no obstructive CAD   Hyperlipidemia    Hypersomnia 07/17/2014   Hypertension    Insomnia    a.) uses melatonin +/- trazodone  PRN   Leg cramps 02/26/2021   Marijuana use, episodic    Occasional alcohol consumption    On long term clopidogrel  therapy    OSA (obstructive sleep apnea)    a.) not currently on nocturnal PAP therapy; needs repeat PSG   Seasonal allergies    T2DM (type 2 diabetes mellitus) (HCC)    a.) monitors with Freestyle Libre CGM   Tinnitus     Past Surgical History:  Procedure Laterality Date   ANTERIOR CERVICAL DECOMP/DISCECTOMY FUSION N/A 2014   ENDARTERECTOMY FEMORAL Left 08/04/2023   Procedure: ENDARTERECTOMY FEMORAL (SFA STENT PLACEMENT);  Surgeon: Marea Selinda RAMAN, MD;  Location: [facility] ORS;  Service: Vascular;  Laterality: Left;   KNEE SURGERY Right    meniscus   LEFT HEART CATH AND CORONARY  ANGIOGRAPHY Left 11/27/2008   Procedure: LEFT HEART CATH AND CORONARY ANGIOGRAPHY; Location: [facility]; Surgeon: Donato Muir, MD   LOWER EXTREMITY ANGIOGRAPHY Right 11/16/2022   Procedure: Lower Extremity Angiography;  Surgeon: Marea Selinda RAMAN, MD;  Location: [facility] INVASIVE CV LAB;  Service: Cardiovascular;  Laterality: Right;   LOWER EXTREMITY ANGIOGRAPHY Left 03/22/2023   Procedure: Lower Extremity Angiography;  Surgeon: Marea Selinda RAMAN, MD;  Location: [facility] INVASIVE CV LAB;  Service: Cardiovascular;  Laterality: Left;    ROTATOR CUFF REPAIR Right    WISDOM TOOTH EXTRACTION      Family History  Problem Relation Age of Onset   Heart attack Father 56   Alcohol abuse Father    Drug abuse Father    Heart disease Father    Lung cancer Father    Hypertension Mother    Heart attack Maternal Grandmother 64   Stroke Maternal Aunt 64    SOCIAL HX: Former smoker   Current Outpatient Medications:    albuterol  (VENTOLIN  HFA) 108 (90 Base) MCG/ACT inhaler, USE 2 INHALATIONS EVERY 6 HOURS AS NEEDED FOR WHEEZING OR SHORTNESS OF BREATH, Disp: 8.5 g, Rfl: 10   allopurinol  (ZYLOPRIM ) 100 MG tablet, Take 200 mg by mouth daily., Disp: , Rfl:    ALPRAZolam  (XANAX ) 0.5 MG tablet, Take 0.5 mg by mouth as needed., Disp: , Rfl:    atorvastatin  (LIPITOR) 80 MG tablet, TAKE 1 TABLET BY MOUTH EVERY DAY, Disp: 90 tablet, Rfl: 3   Blood Glucose Monitoring Suppl (ONETOUCH VERIO FLEX SYSTEM) w/Device KIT, by Does not apply route., Disp: , Rfl:    cetirizine (ZYRTEC) 10 MG tablet, Take 10 mg by mouth 2 (two) times daily., Disp: , Rfl:    clopidogrel  (PLAVIX ) 75 MG tablet, Take 1 tablet (75 mg total) by mouth daily., Disp: 90 tablet, Rfl: 4   colchicine 0.6 MG tablet, Take 0.6 mg by mouth 2 (two) times daily as needed., Disp: , Rfl:    Continuous Blood Gluc Sensor (FREESTYLE LIBRE 2 SENSOR) MISC, USE TO CHECK GLUCOSE THREE TIMES A DAY, Disp: 2 each, Rfl: 12   fluticasone  (FLONASE ) 50 MCG/ACT nasal spray, Place 2 sprays into both nostrils daily. (Patient taking differently: Place 2 sprays into both nostrils daily as needed.), Disp: 16 g, Rfl: 6   Fluticasone  Furoate (ARNUITY ELLIPTA ) 100 MCG/ACT AEPB, Inhale 1 puff into the lungs daily in the afternoon., Disp: 180 each, Rfl: 2   glipiZIDE  (GLUCOTROL  XL) 10 MG 24 hr tablet, Take 10 mg by mouth., Disp: , Rfl:    guaiFENesin -codeine  100-10 MG/5ML syrup, Take 10 mLs by mouth every 8 (eight) hours as needed for cough., Disp: 120 mL, Rfl: 0   ipratropium (ATROVENT ) 0.06 % nasal spray, Place 2  sprays into both nostrils 4 (four) times daily., Disp: 15 mL, Rfl: 12   ketoconazole  (NIZORAL ) 2 % cream, APPLY TOPICALLY TWICE A DAY AS NEEDED, Disp: , Rfl:    leptospermum manuka honey (MEDIHONEY) PSTE paste, Apply 1 Application topically daily., Disp: 90 mL, Rfl: 2   Melatonin 10 MG CAPS, Take 10 mg by mouth every evening., Disp: , Rfl:    molnupiravir  EUA (LAGEVRIO ) 200 mg CAPS capsule, Take 4 capsules (800 mg total) by mouth 2 (two) times daily for 5 days., Disp: 40 capsule, Rfl: 0   Multiple Vitamin (MULTI-VITAMIN) tablet, Take 1 tablet by mouth daily., Disp: , Rfl:    OneTouch Delica Lancets 33G MISC, by Does not apply route., Disp: , Rfl:  ONETOUCH VERIO test strip, , Disp: , Rfl:    OVER THE COUNTER MEDICATION, Take 600 mg by mouth daily. Goli Ashwagandha Gummies, Disp: , Rfl:    pantoprazole  (PROTONIX ) 40 MG tablet, Take 1 tablet (40 mg total) by mouth daily., Disp: 90 tablet, Rfl: 3   pioglitazone  (ACTOS ) 30 MG tablet, Take 30 mg by mouth daily., Disp: , Rfl:    Probiotic Product (PROBIOTIC DAILY PO), Take 1 capsule by mouth daily., Disp: , Rfl:    Semaglutide, 2 MG/DOSE, (OZEMPIC, 2 MG/DOSE,) 8 MG/3ML SOPN, Inject 2 mg into the skin once a week., Disp: , Rfl:    SYNJARDY  XR 12.02-999 MG TB24, Take 2 tablets by mouth daily before breakfast., Disp: , Rfl:    traZODone  (DESYREL ) 100 MG tablet, Take 100 mg by mouth at bedtime., Disp: , Rfl:    valsartan  (DIOVAN ) 320 MG tablet, TAKE 1 TABLET BY MOUTH EVERY DAY, Disp: 90 tablet, Rfl: 3   amoxicillin -clavulanate (AUGMENTIN ) 875-125 MG tablet, Take 1 tablet by mouth 2 (two) times daily., Disp: 14 tablet, Rfl: 0   clotrimazole -betamethasone  (LOTRISONE ) cream, APPLY 1 APPLICATION TOPICALLY DAILY (Patient not taking: Reported on 11/10/2023), Disp: 30 g, Rfl: 0   cyclobenzaprine  (FLEXERIL ) 5 MG tablet, Take 1 tablet (5 mg total) by mouth 3 (three) times daily as needed for muscle spasms. (Patient not taking: Reported on 11/10/2023), Disp: 30 tablet,  Rfl: 0   gabapentin  (NEURONTIN ) 300 MG capsule, Take 1 capsule (300 mg total) by mouth daily. (Patient not taking: Reported on 11/10/2023), Disp: , Rfl:    HYDROcodone -acetaminophen  (NORCO/VICODIN) 5-325 MG tablet, Take 1 tablet by mouth every 6 (six) hours as needed for moderate pain (pain score 4-6)., Disp: 20 tablet, Rfl: 0   predniSONE  (DELTASONE ) 20 MG tablet, Take 2 tablets (40 mg total) by mouth daily., Disp: 10 tablet, Rfl: 0  EXAM:  VITALS per patient if applicable:  GENERAL: alert, oriented, appears well and in no acute distress  HEENT: atraumatic, conjunttiva clear, no obvious abnormalities on inspection of external nose and ears  NECK: normal movements of the head and neck  LUNGS: on inspection no signs of respiratory distress, breathing rate appears normal, no obvious gross SOB, gasping or wheezing  CV: no obvious cyanosis  MS: moves all visible extremities without noticeable abnormality  PSYCH/NEURO: pleasant and cooperative, no obvious depression or anxiety, speech and thought processing grossly intact  ASSESSMENT AND PLAN:  Discussed the following assessment and plan:  Problem List Items Addressed This Visit     COVID-19 - Primary   Patient with positive home COVID test and COVID symptoms and systemic symptoms with chills.  He is at high risk for progression to severe illness.  We will treat with molnupiravir  given that he cannot take Paxlovid as he is on Plavix .  We will prescribe codeine  cough syrup for him to use for his cough.  Discussed the risk of drowsiness with this.  Advised that he should not take his Xanax  when he is taking the cough medication.  Discussed if he is excessively drowsy with a cough medication he needs to discontinue use and let us  know.  Advised he should not drive on the cough medication.  Discussed staying home until he is feeling better for at least 24 hours and has been afebrile and without chills for at least 24 hours without use of Tylenol .   Discussed he could use Tylenol  for body aches and fevers.  If not improving he will let us  know.  If  you develop significant shortness of breath he will seek medical attention in the emergency room.      Relevant Medications   guaiFENesin -codeine  100-10 MG/5ML syrup   molnupiravir  EUA (LAGEVRIO ) 200 mg CAPS capsule    Return if symptoms worsen or fail to improve.   I discussed the assessment and treatment plan with the patient. The patient was provided an opportunity to ask questions and all were answered. The patient agreed with the plan and demonstrated an understanding of the instructions.   The patient was advised to call back or seek an in-person evaluation if the symptoms worsen or if the condition fails to improve as anticipated.  Camellia Her, MD

## 2023-11-10 NOTE — Telephone Encounter (Signed)
 It looks like a prescription for molnupiravir  has already been sent for him to his pharmacy.  Can we confirm this?

## 2023-11-10 NOTE — Telephone Encounter (Signed)
 Lm x1 for patient.

## 2023-11-10 NOTE — Telephone Encounter (Signed)
 Pt was able to discuss medication options with Dr. Lovetta Rucks. Video visit was completed

## 2023-11-10 NOTE — Telephone Encounter (Signed)
 Patient had a virtual visit with Dr. Lovetta Rucks this morning.

## 2023-11-11 NOTE — Telephone Encounter (Signed)
 NFN

## 2023-11-17 ENCOUNTER — Telehealth: Payer: Self-pay | Admitting: Pulmonary Disease

## 2023-11-17 NOTE — Telephone Encounter (Signed)
Lm x2 for patient.  Will close encounter per office protocol.

## 2023-11-17 NOTE — Telephone Encounter (Signed)
Patient is returning phone call. Patient phone number is [phone].

## 2023-12-02 ENCOUNTER — Encounter: Payer: Self-pay | Admitting: Podiatry

## 2023-12-02 ENCOUNTER — Ambulatory Visit: Payer: 59 | Admitting: Podiatry

## 2023-12-02 DIAGNOSIS — E1151 Type 2 diabetes mellitus with diabetic peripheral angiopathy without gangrene: Secondary | ICD-10-CM

## 2023-12-02 DIAGNOSIS — B351 Tinea unguium: Secondary | ICD-10-CM | POA: Diagnosis not present

## 2023-12-02 DIAGNOSIS — M79675 Pain in left toe(s): Secondary | ICD-10-CM

## 2023-12-02 DIAGNOSIS — M79674 Pain in right toe(s): Secondary | ICD-10-CM

## 2023-12-02 DIAGNOSIS — L84 Corns and callosities: Secondary | ICD-10-CM

## 2023-12-02 NOTE — Progress Notes (Signed)
  Subjective:  Patient ID: Kevin Huerta, male    DOB: 1966/08/07,  MRN: [id number]  58 y.o. male presents at risk foot care. Pt has h/o NIDDM with PAD and painful mycotic toenails x 10 which interfere with daily activities. Pain is relieved with periodic professional debridement.  Chief Complaint  Patient presents with   Diabetes    "I'm Diabetic.  I'm her to have my nails trimmed.  The right big toe has been sore.  It's thick, might need to be filed down some."  PCP - Dr. Gershon Crane saw Nov. 2024, A1c - 9 (October 2024)   New problem(s): None   PCP is Dana Allan, MD.  Allergies  Allergen Reactions   Aspirin Tinitus    Review of Systems: Negative except as noted in the HPI.   Objective:  Kevin Huerta is a pleasant 58 y.o. male obese in NAD. AAO x 3.  Vascular Examination: Vascular status intact b/l with palpable pedal pulses. CFT immediate b/l. Pedal hair present. No edema. No pain with calf compression b/l. Skin temperature gradient WNL b/l. No varicosities noted. No cyanosis or clubbing noted.  Neurological Examination: Sensation grossly intact b/l with 10 gram monofilament. Vibratory sensation intact b/l.  Dermatological Examination: Pedal skin with normal turgor, texture and tone b/l. No open wounds nor interdigital macerations noted. Toenails 1-5 b/l thick, discolored, elongated with subungual debris and pain on dorsal palpation.  No corns, calluses nor porokeratotic lesions noted.  Musculoskeletal Examination: Muscle strength 5/5 to b/l LE.  No pain, crepitus noted b/l. HAV with bunion deformity noted b/l LE. Pes planus deformity noted bilateral LE.  Radiographs: None  Last A1c:      Latest Ref Rng & Units 08/04/2023    2:59 PM  Hemoglobin A1C  Hemoglobin-A1c 4.8 - 5.6 % 9.1      Assessment:   1. Pain due to onychomycosis of toenails of both feet [B35.1, M79.675, M79.674]   2. Type II diabetes mellitus with peripheral circulatory disorder [facility])    Plan:   Patient was evaluated and treated. All patient's and/or POA's questions/concerns addressed on today's visit. Toenails 1-5 debrided in length and girth without incident. Continue foot and shoe inspections daily. Monitor blood glucose per PCP/Endocrinologist's recommendations. Continue soft, supportive shoe gear daily. Report any pedal injuries to medical professional. Call office if there are any questions/concerns. -Patient/POA to call should there be question/concern in the interim.  Return in about 3 months (around 02/29/2024).  Freddie Breech, DPM      [place] LOCATION: [address], [place] [address]                   Office [phone]   [facility] LOCATION: [address] [place], [place] [address] Office [phone]

## 2023-12-27 ENCOUNTER — Telehealth: Payer: Self-pay | Admitting: Internal Medicine

## 2023-12-27 MED ORDER — ARNUITY ELLIPTA 100 MCG/ACT IN AEPB: 1.0000 | INHALATION_SPRAY | Freq: Every day | RESPIRATORY_TRACT | 0 refills | Status: DC

## 2023-12-27 NOTE — Telephone Encounter (Signed)
 Patient would like refill for Arnuity. Would like 3 month supply. Pharmacy is [facility] [place]. Patient scheduled with Dr. Belia Heman on 01/25/2024. Patient phone number is [phone].

## 2023-12-27 NOTE — Telephone Encounter (Signed)
 [facility] nothing further needed.

## 2024-01-14 ENCOUNTER — Ambulatory Visit (INDEPENDENT_AMBULATORY_CARE_PROVIDER_SITE_OTHER): Payer: 59 | Admitting: Vascular Surgery

## 2024-01-14 ENCOUNTER — Encounter (INDEPENDENT_AMBULATORY_CARE_PROVIDER_SITE_OTHER): Payer: 59

## 2024-01-18 ENCOUNTER — Ambulatory Visit (INDEPENDENT_AMBULATORY_CARE_PROVIDER_SITE_OTHER): Payer: 59 | Admitting: Vascular Surgery

## 2024-01-18 ENCOUNTER — Encounter (INDEPENDENT_AMBULATORY_CARE_PROVIDER_SITE_OTHER): Payer: Self-pay | Admitting: Vascular Surgery

## 2024-01-18 ENCOUNTER — Ambulatory Visit (INDEPENDENT_AMBULATORY_CARE_PROVIDER_SITE_OTHER): Payer: 59

## 2024-01-18 VITALS — BP 129/76 | HR 80 | Resp 18 | Ht 70.0 in | Wt 247.0 lb

## 2024-01-18 DIAGNOSIS — E1159 Type 2 diabetes mellitus with other circulatory complications: Secondary | ICD-10-CM

## 2024-01-18 DIAGNOSIS — I152 Hypertension secondary to endocrine disorders: Secondary | ICD-10-CM

## 2024-01-18 DIAGNOSIS — I70229 Atherosclerosis of native arteries of extremities with rest pain, unspecified extremity: Secondary | ICD-10-CM

## 2024-01-18 DIAGNOSIS — E119 Type 2 diabetes mellitus without complications: Secondary | ICD-10-CM | POA: Diagnosis not present

## 2024-01-18 NOTE — Assessment & Plan Note (Signed)
 His ABIs today 0.9 right and 1.25 on the left with triphasic waveforms and normal digital pressures bilaterally.  Symptoms markedly improved after revascularization.  Continue current medical regimen.  Recheck in 6 months with noninvasive studies.

## 2024-01-18 NOTE — Progress Notes (Signed)
 MRN : [id number]  Kevin Huerta is a 58 y.o. (Apr 17, 1966) male who presents with chief complaint of  Chief Complaint  Patient presents with   Follow-up    3 month abi follow up  .  History of Present Illness: Patient returns today in follow up of his PAD.  He is doing quite well.  He underwent extensive left femoral endarterectomy as well as SFA stent placement last fall and has done very well from this.  He is currently walking well without any short distance claudication, he has no ischemic rest pain, and no ulceration.  The reperfusion swelling has resolved.  His ABIs today 0.9 right and 1.25 on the left with triphasic waveforms and normal digital pressures bilaterally.  Current Outpatient Medications  Medication Sig Dispense Refill   albuterol (VENTOLIN HFA) 108 (90 Base) MCG/ACT inhaler USE 2 INHALATIONS EVERY 6 HOURS AS NEEDED FOR WHEEZING OR SHORTNESS OF BREATH 8.5 g 10   allopurinol (ZYLOPRIM) 100 MG tablet Take 200 mg by mouth daily.     ALPRAZolam (XANAX) 0.5 MG tablet Take 0.5 mg by mouth as needed.     atorvastatin (LIPITOR) 80 MG tablet TAKE 1 TABLET BY MOUTH EVERY DAY 90 tablet 3   Blood Glucose Monitoring Suppl (ONETOUCH VERIO FLEX SYSTEM) w/Device KIT by Does not apply route.     cetirizine (ZYRTEC) 10 MG tablet Take 10 mg by mouth 2 (two) times daily.     clopidogrel (PLAVIX) 75 MG tablet Take 1 tablet (75 mg total) by mouth daily. 90 tablet 4   colchicine 0.6 MG tablet Take 0.6 mg by mouth 2 (two) times daily as needed.     Continuous Blood Gluc Sensor (FREESTYLE LIBRE 2 SENSOR) MISC USE TO CHECK GLUCOSE THREE TIMES A DAY 2 each 12   fluticasone (FLONASE) 50 MCG/ACT nasal spray Place 2 sprays into both nostrils daily. (Patient taking differently: Place 2 sprays into both nostrils daily as needed.) 16 g 6   Fluticasone Furoate (ARNUITY ELLIPTA) 100 MCG/ACT AEPB Inhale 1 puff into the lungs daily in the afternoon. 90 each 0   glipiZIDE (GLUCOTROL XL) 10 MG 24 hr tablet  Take 10 mg by mouth.     ipratropium (ATROVENT) 0.06 % nasal spray Place 2 sprays into both nostrils 4 (four) times daily. 15 mL 12   ketoconazole (NIZORAL) 2 % cream APPLY TOPICALLY TWICE A DAY AS NEEDED     Melatonin 10 MG CAPS Take 10 mg by mouth every evening.     Multiple Vitamin (MULTI-VITAMIN) tablet Take 1 tablet by mouth daily.     OneTouch Delica Lancets 33G MISC by Does not apply route.     ONETOUCH VERIO test strip      OVER THE COUNTER MEDICATION Take 600 mg by mouth daily. Goli Ashwagandha Gummies     pantoprazole (PROTONIX) 40 MG tablet Take 1 tablet (40 mg total) by mouth daily. 90 tablet 3   pioglitazone (ACTOS) 30 MG tablet Take 30 mg by mouth daily.     Probiotic Product (PROBIOTIC DAILY PO) Take 1 capsule by mouth daily.     Semaglutide, 2 MG/DOSE, (OZEMPIC, 2 MG/DOSE,) 8 MG/3ML SOPN Inject 2 mg into the skin once a week.     SYNJARDY XR 12.02-999 MG TB24 Take 2 tablets by mouth daily before breakfast.     traZODone (DESYREL) 100 MG tablet Take 100 mg by mouth at bedtime.     valsartan (DIOVAN) 320 MG tablet TAKE 1 TABLET BY MOUTH  EVERY DAY 90 tablet 3   amoxicillin-clavulanate (AUGMENTIN) 875-125 MG tablet Take 1 tablet by mouth 2 (two) times daily. 14 tablet 0   clotrimazole-betamethasone (LOTRISONE) cream APPLY 1 APPLICATION TOPICALLY DAILY (Patient not taking: Reported on 11/10/2023) 30 g 0   cyclobenzaprine (FLEXERIL) 5 MG tablet Take 1 tablet (5 mg total) by mouth 3 (three) times daily as needed for muscle spasms. (Patient not taking: Reported on 11/10/2023) 30 tablet 0   gabapentin (NEURONTIN) 300 MG capsule Take 1 capsule (300 mg total) by mouth daily. (Patient not taking: Reported on 11/10/2023)     guaiFENesin-codeine 100-10 MG/5ML syrup Take 10 mLs by mouth every 8 (eight) hours as needed for cough. 120 mL 0   HYDROcodone-acetaminophen (NORCO/VICODIN) 5-325 MG tablet Take 1 tablet by mouth every 6 (six) hours as needed for moderate pain (pain score 4-6). 20 tablet 0    leptospermum manuka honey (MEDIHONEY) PSTE paste Apply 1 Application topically daily. 90 mL 2   predniSONE (DELTASONE) 20 MG tablet Take 2 tablets (40 mg total) by mouth daily. 10 tablet 0   No current facility-administered medications for this visit.    Past Medical History:  Diagnosis Date   Allergic rhinitis, seasonal    Anginal equivalent (HCC)    Anxiety    a.) on BZO (alprazolam) PRN   Aortic atherosclerosis (HCC)    Arthritis    Asthma    Atherosclerosis of native artery of both lower extremities with intermittent claudication (HCC)    a.) s/p PTA of RIGHT prox peroneal, prox SFA, popliteal, and distal SFA; 6 mm x 15 cm Viabahn stent to RIGHT distal SFA and above knee popliteal artery; b.) s/p unsuccessful PTA of occluded SFA 03/22/2023 --> plans for endarterectomy +/-  endoluminal intervention   BRBPR (bright red blood per rectum) 05/24/2020   Chest heaviness 10/27/2022   Coronary artery disease of native artery of native heart with stable angina pectoris (HCC) 03/29/2023   a.) cCTA 03/29/2023: Ca2+ 476 (95th percentile for age/sex/race matched control)   Current vaping on some days    DDD (degenerative disc disease), cervical    a.) s/p ACDF C3-C5 2014   Dermatitis 06/02/2021   Diarrhea 02/26/2021   Emphysema lung (HCC)    Fatty liver 09/03/2020   GERD (gastroesophageal reflux disease)    Gout    Heart murmur    History of cardiac catheterization 11/27/2008   a.) LHC 11/27/2008 at [facility] --> normal coronary anatomy with no obstructive CAD   Hyperlipidemia    Hypersomnia 07/17/2014   Hypertension    Insomnia    a.) uses melatonin +/- trazodone PRN   Leg cramps 02/26/2021   Marijuana use, episodic    Occasional alcohol consumption    On long term clopidogrel therapy    OSA (obstructive sleep apnea)    a.) not currently on nocturnal PAP therapy; needs repeat PSG   Seasonal allergies    T2DM (type 2 diabetes mellitus) (HCC)    a.) monitors with  Freestyle Libre CGM   Tinnitus     Past Surgical History:  Procedure Laterality Date   ANTERIOR CERVICAL DECOMP/DISCECTOMY FUSION N/A 2014   ENDARTERECTOMY FEMORAL Left 08/04/2023   Procedure: ENDARTERECTOMY FEMORAL (SFA STENT PLACEMENT);  Surgeon: Celso College, MD;  Location: [facility] ORS;  Service: Vascular;  Laterality: Left;   KNEE SURGERY Right    meniscus   LEFT HEART CATH AND CORONARY ANGIOGRAPHY Left 11/27/2008   Procedure: LEFT HEART CATH AND CORONARY ANGIOGRAPHY; Location:  [facility]; Surgeon: Shari Prows, MD   LOWER EXTREMITY ANGIOGRAPHY Right 11/16/2022   Procedure: Lower Extremity Angiography;  Surgeon: Annice Needy, MD;  Location: [facility] INVASIVE CV LAB;  Service: Cardiovascular;  Laterality: Right;   LOWER EXTREMITY ANGIOGRAPHY Left 03/22/2023   Procedure: Lower Extremity Angiography;  Surgeon: Annice Needy, MD;  Location: [facility] INVASIVE CV LAB;  Service: Cardiovascular;  Laterality: Left;   ROTATOR CUFF REPAIR Right    WISDOM TOOTH EXTRACTION       Social History   Tobacco Use   Smoking status: Some Days    Current packs/day: 0.00    Average packs/day: 1.5 packs/day for 32.0 years (48.0 ttl pk-yrs)    Types: Cigarettes, E-cigarettes    Start date: 06/11/1986    Last attempt to quit: 06/11/2018    Years since quitting: 5.6   Smokeless tobacco: Former    Types: Snuff    Quit date: 1996   Tobacco comments:    I vape the marijuana from time to time, no tobacco products. - DJM 12/02/2023  Vaping Use   Vaping status: Some Days   Substances: Nicotine   Devices: x3 per day to quit smoking nicotine   Substance Use Topics   Alcohol use: Yes    Comment: cut back, 6 pack per month   Drug use: Yes    Types: Marijuana    Comment: a few times a year      Family History  Problem Relation Age of Onset   Heart attack Father 30   Alcohol abuse Father    Drug abuse Father    Heart disease Father    Lung cancer Father    Hypertension Mother     Heart attack Maternal Grandmother 12   Stroke Maternal Aunt 53     Allergies  Allergen Reactions   Aspirin Tinitus     REVIEW OF SYSTEMS (Negative unless checked)  Constitutional: [] Weight loss  [] Fever  [] Chills Cardiac: [] Chest pain   [] Chest pressure   [] Palpitations   [] Shortness of breath when laying flat   [] Shortness of breath at rest   [] Shortness of breath with exertion. Vascular:  [x] Pain in legs with walking   [] Pain in legs at rest   [] Pain in legs when laying flat   [] Claudication   [] Pain in feet when walking  [] Pain in feet at rest  [] Pain in feet when laying flat   [] History of DVT   [] Phlebitis   [] Swelling in legs   [] Varicose veins   [] Non-healing ulcers Pulmonary:   [] Uses home oxygen   [] Productive cough   [] Hemoptysis   [] Wheeze  [] COPD   [] Asthma Neurologic:  [] Dizziness  [] Blackouts   [] Seizures   [] History of stroke   [] History of TIA  [] Aphasia   [] Temporary blindness   [] Dysphagia   [] Weakness or numbness in arms   [] Weakness or numbness in legs Musculoskeletal:  [x] Arthritis   [] Joint swelling   [] Joint pain   [x] Low back pain Hematologic:  [] Easy bruising  [] Easy bleeding   [] Hypercoagulable state   [] Anemic   Gastrointestinal:  [] Blood in stool   [] Vomiting blood  [x] Gastroesophageal reflux/heartburn   [] Abdominal pain Genitourinary:  [] Chronic kidney disease   [] Difficult urination  [] Frequent urination  [] Burning with urination   [] Hematuria Skin:  [] Rashes   [] Ulcers   [] Wounds Psychological:  [] History of anxiety   []  History of major depression.  Physical Examination  BP 129/76   Pulse 80   Resp 18  Ht 5\' 10"  (1.778 m)   Wt 247 lb (112 kg)   BMI 35.44 kg/m  Gen:  WD/WN, NAD Head: [place]/AT, No temporalis wasting. Ear/Nose/Throat: Hearing grossly intact, nares w/o erythema or drainage Eyes: Conjunctiva clear. Sclera non-icteric Neck: Supple.  Trachea midline Pulmonary:  Good air movement, no use of accessory muscles.  Cardiac: RRR, no  JVD Vascular:  Vessel Right Left  Radial Palpable Palpable                          PT Palpable Palpable  DP Palpable Palpable   Gastrointestinal: soft, non-tender/non-distended. No guarding/reflex.  Musculoskeletal: M/S 5/5 throughout.  No deformity or atrophy. No edema. Neurologic: Sensation grossly intact in extremities.  Symmetrical.  Speech is fluent.  Psychiatric: Judgment intact, Mood & affect appropriate for pt's clinical situation. Dermatologic: No rashes or ulcers noted.  No cellulitis or open wounds.      Labs Recent Results (from the past 2160 hours)  HM DIABETES EYE EXAM     Status: None   Collection Time: 11/08/23  7:45 AM  Result Value Ref Range   HM Diabetic Eye Exam No Retinopathy No Retinopathy    Comment: ABSTRACTED BY HIM    Radiology No results found.  Assessment/Plan  Atherosclerosis of artery of extremity with rest pain (HCC) His ABIs today 0.9 right and 1.25 on the left with triphasic waveforms and normal digital pressures bilaterally.  Symptoms markedly improved after revascularization.  Continue current medical regimen.  Recheck in 6 months with noninvasive studies.  Hypertension associated with diabetes (HCC) blood pressure control important in reducing the progression of atherosclerotic disease. On appropriate oral medications.     Type 2 diabetes mellitus without complication, without long-term current use of insulin (HCC) blood glucose control important in reducing the progression of atherosclerotic disease. Also, involved in wound healing. On appropriate medications.  Mikki Alexander, MD  01/18/2024 10:56 AM    This note was created with Dragon medical transcription system.  Any errors from dictation are purely unintentional

## 2024-01-19 LAB — HEMOGLOBIN A1C: Hemoglobin A1C: 7.9

## 2024-01-19 LAB — VAS US ABI WITH/WO TBI
Left ABI: 1.25
Right ABI: 0.99

## 2024-01-25 ENCOUNTER — Telehealth: Payer: Self-pay

## 2024-01-25 ENCOUNTER — Encounter: Payer: Self-pay | Admitting: Internal Medicine

## 2024-01-25 ENCOUNTER — Ambulatory Visit: Admitting: Internal Medicine

## 2024-01-25 VITALS — BP 130/80 | HR 82 | Temp 98.2°F | Ht 70.0 in | Wt 244.6 lb

## 2024-01-25 DIAGNOSIS — J449 Chronic obstructive pulmonary disease, unspecified: Secondary | ICD-10-CM

## 2024-01-25 DIAGNOSIS — G4733 Obstructive sleep apnea (adult) (pediatric): Secondary | ICD-10-CM | POA: Diagnosis not present

## 2024-01-25 DIAGNOSIS — Z87891 Personal history of nicotine dependence: Secondary | ICD-10-CM | POA: Diagnosis not present

## 2024-01-25 LAB — NITRIC OXIDE: Nitric Oxide: 11

## 2024-01-25 MED ORDER — TRELEGY ELLIPTA 100-62.5-25 MCG/ACT IN AEPB: 1.0000 | INHALATION_SPRAY | Freq: Every day | RESPIRATORY_TRACT | 10 refills | Status: DC

## 2024-01-25 MED ORDER — TRELEGY ELLIPTA 100-62.5-25 MCG/ACT IN AEPB: 1.0000 | INHALATION_SPRAY | Freq: Every day | RESPIRATORY_TRACT | 3 refills | Status: DC

## 2024-01-25 MED ORDER — TRELEGY ELLIPTA 100-62.5-25 MCG/ACT IN AEPB: 1.0000 | INHALATION_SPRAY | Freq: Every day | RESPIRATORY_TRACT | 0 refills | Status: DC

## 2024-01-25 NOTE — Telephone Encounter (Signed)
 Copied from CRM [id number]. Topic: Clinical - Prescription Issue >> Jan 25, 2024 12:35 PM Isabell A wrote: Reason for CRM: Patient states [facility] told him their unable to fill his TRELEGY ELLIPTA , it needs to be sent to his local pharmacy.   Patient requesting three month supply.   [id number] [place], [facility] [address], [place] [address] Phone: [phone]  Fax: [phone]

## 2024-01-25 NOTE — Patient Instructions (Signed)
 With your shortness of breath we will try a new inhaler called Trelegy We will need to obtain pulmonary function testing to assess lung function  With a history of smoking We will need to reestablish lung cancer screening program  History of sleep apnea Referral to DME company [employer] RESMED AIR-TOUCH FIT N30i MASK START AUTO CPAP 4-12 cm h20  Recommend weight loss  Avoid Allergens and Irritants Avoid secondhand smoke Avoid SICK contacts Recommend  Masking  when appropriate Recommend Keep up-to-date with vaccinations

## 2024-01-25 NOTE — Progress Notes (Signed)
 [facility]   Past Surgical History:  He  has a past surgical history that includes Rotator cuff repair (Right); Knee surgery (Right); Wisdom tooth extraction; Anterior cervical decomp/discectomy fusion (N/A, 2014); Lower Extremity Angiography (Right, 11/16/2022); Lower Extremity Angiography (Left, 03/22/2023); LEFT HEART CATH AND CORONARY ANGIOGRAPHY (Left, 11/27/2008); and Endarterectomy femoral (Left, 08/04/2023).  Past Medical History:  DM type 2, Fatty liver, HLD, HTN, Gout  Constitutional:  There were no vitals taken for this visit.  Brief Summary:  Kevin Huerta is a 58 y.o. male with shortness of breath and sleep apnea.     CC Follow up assessment for ASTHMA Follow up assessment for OSA Subjective:   Assessment of asthma Patient has a history of childhood asthma Triggers include bleach and cold air Patient has progressive shortness of breath over the last several years  Assessment of ASTHMA FeNO  11  ppb-not consistent with type II inflammation -continue inhalers as prescribed, Arnuity does not help this for we will try Trelegy   No exacerbation at this time No evidence of heart failure at this time No evidence or signs of infection at this time No respiratory distress No fevers, chills, nausea, vomiting, diarrhea No evidence of lower extremity edema No evidence hemoptysis   Assessment OSA Patient with mild to moderate OSA Significant hypoxia Recommend starting auto CPAP therapy   Former smoker quit 5 years ago Reestablish lung cancer screening program Previous history of COVID 2021 Pneumonia several years ago     Physical Exam:   BP 130/80 (BP Location: Right Arm, Patient Position: Sitting, Cuff Size: Normal)   Pulse 82   Temp 98.2 F (36.8 C) (Oral)   Wt 244 lb 9.6 oz (110.9 kg)   SpO2 98%   BMI 35.10 kg/m   He is not starting CPAP therapy and it CPAP thank you  Review of Systems: Gen:  Denies   fever, sweats, chills weight loss  HEENT: Denies blurred vision, double vision, ear pain, eye pain, hearing loss, nose bleeds, sore throat Cardiac:  No dizziness, chest pain or heaviness, chest tightness,edema, No JVD Resp:   No cough, -sputum production, +shortness of breath,-wheezing, -hemoptysis,  Other:  All other systems negative   Physical Examination:   General Appearance: No distress  EYES PERRLA, EOM intact.   NECK Supple, No JVD Pulmonary: normal breath sounds, No wheezing.  CardiovascularNormal S1,S2.  No m/r/g.   Abdomen: Benign, Soft, non-tender. Neurology UE/LE 5/5 strength, no focal deficits Ext pulses intact, cap refill intact ALL OTHER ROS ARE NEGATIVE     Pulmonary testing:  FeNO 02/04/23 >> 62 FeNO 01/25/2024>>   Chest Imaging:  LDCT chest 02/02/22 >> coronary calcification, mild centrilobular emphysema, b/l calcified granulomas  Sleep Tests:  HST 08/22/20 >> AHI 7.9, SpO2 low 82% HST August 2024 AHI 4% 8.2 AHI 3% 17.6, less than 88% oxygen/for 15 minutes average apnea duration is 27 seconds  Cardiac Tests:    Social History:  He  reports that he has been smoking cigarettes and e-cigarettes. He started smoking about 37 years ago. He has a 48 pack-year smoking history. He quit smokeless tobacco use about 29 years ago.  His smokeless tobacco use included snuff. He reports current alcohol use. He reports current drug use. Drug: Marijuana.  Family History:  His family history includes Alcohol abuse in his father; Drug abuse in his father; Heart attack (age of onset: 7) in his father; Heart attack (age of onset: 67) in his maternal  grandmother; Heart disease in his father; Hypertension in his mother; Lung cancer in his father; Stroke (age of onset: 47) in his maternal aunt.     Assessment/Plan:  49 year old morbidly obese African-American male with underlying diagnosis of asthma COPD with underlying sleep apnea in the setting of morbid obesity and  deconditioned state   Dyspnea on exertion related to history of asthma and with tobacco abuse COPD with history of asthma and tobacco abuse, CT imaging showing very mild  emphysema and coronary calcification, and family history of obstructive lung disease Arnuity does not help we will try Trelegy inhaler 100 Samples to be given in office Current Feno is 11 which does not indicate asthma exacerbation Recommend follow-up pulmonary function testing Avoid Allergens and Irritants Avoid secondhand smoke Avoid SICK contacts Recommend  Masking  when appropriate Recommend Keep up-to-date with vaccinations    Former smoker Reestablish lung cancer screening program    Assessment of OSA Referral to DME company [employer] RESMED AIR-TOUCH FIT N30i MASK START AUTO CPAP 4-12 cm h20 Patient Instructions  Continue to use CPAP every night, minimum of 4-6 hours a night.  Change equipment every 30 days or as directed by DME.  Wash your tubing with warm soap and water daily, hang to dry. Wash humidifier portion weekly. Use bottled, distilled water and change daily   Be aware of reduced alertness and do not drive or operate heavy machinery if experiencing this or drowsiness.  Exercise encouraged, as tolerated. Encouraged proper weight management.  Important to get eight or more hours of sleep  Limiting the use of the computer and television before bedtime.  Decrease naps during the day, so night time sleep will become enhanced.  Limit caffeine, and sleep deprivation.  HTN, stroke, uncontrolled diabetes and heart failure are potential risk factors.  Risk of untreated sleep apnea including cardiac arrhthymias, stroke, DM, pulm HTN.      Recommend weight loss Current weight 248 pounds 5 feet 10 inches  Obesity -recommend significant weight loss -recommend changing diet  Deconditioned state -Recommend increased daily activity and exercise   Medication List:   Allergies as of 01/25/2024        Reactions   Aspirin  Tinitus        Medication List        Accurate as of January 25, 2024  7:55 AM. If you have any questions, ask your nurse or doctor.          albuterol  108 (90 Base) MCG/ACT inhaler Commonly known as: VENTOLIN  HFA USE 2 INHALATIONS EVERY 6 HOURS AS NEEDED FOR WHEEZING OR SHORTNESS OF BREATH   allopurinol  100 MG tablet Commonly known as: ZYLOPRIM  Take 200 mg by mouth daily.   ALPRAZolam  0.5 MG tablet Commonly known as: XANAX  Take 0.5 mg by mouth as needed.   amoxicillin -clavulanate 875-125 MG tablet Commonly known as: AUGMENTIN  Take 1 tablet by mouth 2 (two) times daily.   Arnuity Ellipta  100 MCG/ACT Aepb Generic drug: Fluticasone  Furoate Inhale 1 puff into the lungs daily in the afternoon.   atorvastatin  80 MG tablet Commonly known as: LIPITOR TAKE 1 TABLET BY MOUTH EVERY DAY   cetirizine 10 MG tablet Commonly known as: ZYRTEC Take 10 mg by mouth 2 (two) times daily.   clopidogrel  75 MG tablet Commonly known as: Plavix  Take 1 tablet (75 mg total) by mouth daily.   clotrimazole -betamethasone  cream Commonly known as: LOTRISONE  APPLY 1 APPLICATION TOPICALLY DAILY   colchicine 0.6 MG tablet Take 0.6 mg by mouth  2 (two) times daily as needed.   cyclobenzaprine  5 MG tablet Commonly known as: FLEXERIL  Take 1 tablet (5 mg total) by mouth 3 (three) times daily as needed for muscle spasms.   fluticasone  50 MCG/ACT nasal spray Commonly known as: FLONASE  Place 2 sprays into both nostrils daily. What changed:  when to take this reasons to take this   FreeStyle Libre 2 Sensor Misc USE TO CHECK GLUCOSE THREE TIMES A DAY   gabapentin  300 MG capsule Commonly known as: NEURONTIN  Take 1 capsule (300 mg total) by mouth daily.   glipiZIDE  10 MG 24 hr tablet Commonly known as: GLUCOTROL  XL Take 10 mg by mouth.   guaiFENesin -codeine  100-10 MG/5ML syrup Take 10 mLs by mouth every 8 (eight) hours as needed for cough.    HYDROcodone -acetaminophen  5-325 MG tablet Commonly known as: NORCO/VICODIN Take 1 tablet by mouth every 6 (six) hours as needed for moderate pain (pain score 4-6).   ipratropium 0.06 % nasal spray Commonly known as: ATROVENT  Place 2 sprays into both nostrils 4 (four) times daily.   ketoconazole 2 % cream Commonly known as: NIZORAL APPLY TOPICALLY TWICE A DAY AS NEEDED   leptospermum manuka honey Pste paste Apply 1 Application topically daily.   Melatonin 10 MG Caps Take 10 mg by mouth every evening.   Multi-Vitamin tablet Take 1 tablet by mouth daily.   OneTouch Delica Lancets 33G Misc by Does not apply route.   OneTouch Verio Flex System w/Device Kit by Does not apply route.   OneTouch Verio test strip Generic drug: glucose blood   OVER THE COUNTER MEDICATION Take 600 mg by mouth daily. Goli Ashwagandha Gummies   Ozempic (2 MG/DOSE) 8 MG/3ML Sopn Generic drug: Semaglutide (2 MG/DOSE) Inject 2 mg into the skin once a week.   pantoprazole  40 MG tablet Commonly known as: Protonix  Take 1 tablet (40 mg total) by mouth daily.   pioglitazone  30 MG tablet Commonly known as: ACTOS  Take 30 mg by mouth daily.   predniSONE  20 MG tablet Commonly known as: DELTASONE  Take 2 tablets (40 mg total) by mouth daily.   PROBIOTIC DAILY PO Take 1 capsule by mouth daily.   Synjardy  XR 12.02-999 MG Tb24 Generic drug: Empagliflozin -metFORMIN  HCl ER Take 2 tablets by mouth daily before breakfast.   traZODone  100 MG tablet Commonly known as: DESYREL  Take 100 mg by mouth at bedtime.   valsartan  320 MG tablet Commonly known as: DIOVAN  TAKE 1 TABLET BY MOUTH EVERY DAY         MEDICATION ADJUSTMENTS/LABS AND TESTS ORDERED: With your shortness of breath we will try a new inhaler called Trelegy We will need to obtain pulmonary function testing to assess lung function With a history of smoking We will need to reestablish lung cancer screening program History of sleep  apnea Referral to DME company [employer] RESMED AIR-TOUCH FIT N30i MASK START AUTO CPAP 4-12 cm h20 Recommend weight loss Avoid Allergens and Irritants Avoid secondhand smoke Avoid SICK contacts Recommend  Masking  when appropriate Recommend Keep up-to-date with vaccinations   CURRENT MEDICATIONS REVIEWED AT LENGTH WITH PATIENT TODAY   Patient  satisfied with Plan of action and management. All questions answered   Follow up 3 months   I spent a total of 48 minutes reviewing chart data, face-to-face evaluation with the patient, counseling and coordination of care as detailed above.      Lady Pier, M.D.  [facility]  Medical Director [facility] Medical  Director [facility] Cardio-P[facility] Department

## 2024-01-26 ENCOUNTER — Other Ambulatory Visit: Payer: Self-pay | Admitting: Family Medicine

## 2024-01-26 DIAGNOSIS — R0602 Shortness of breath: Secondary | ICD-10-CM

## 2024-02-22 ENCOUNTER — Encounter (INDEPENDENT_AMBULATORY_CARE_PROVIDER_SITE_OTHER): Payer: Self-pay

## 2024-03-06 ENCOUNTER — Ambulatory Visit: Admitting: Family Medicine

## 2024-03-06 ENCOUNTER — Encounter: Payer: Self-pay | Admitting: Podiatry

## 2024-03-06 ENCOUNTER — Ambulatory Visit: Payer: 59 | Admitting: Podiatry

## 2024-03-06 VITALS — Ht 70.0 in | Wt 244.6 lb

## 2024-03-06 DIAGNOSIS — M79674 Pain in right toe(s): Secondary | ICD-10-CM

## 2024-03-06 DIAGNOSIS — L84 Corns and callosities: Secondary | ICD-10-CM

## 2024-03-06 DIAGNOSIS — B351 Tinea unguium: Secondary | ICD-10-CM | POA: Diagnosis not present

## 2024-03-06 DIAGNOSIS — B353 Tinea pedis: Secondary | ICD-10-CM

## 2024-03-06 DIAGNOSIS — E1151 Type 2 diabetes mellitus with diabetic peripheral angiopathy without gangrene: Secondary | ICD-10-CM

## 2024-03-06 DIAGNOSIS — M79675 Pain in left toe(s): Secondary | ICD-10-CM

## 2024-03-06 DIAGNOSIS — L853 Xerosis cutis: Secondary | ICD-10-CM | POA: Diagnosis not present

## 2024-03-06 MED ORDER — KETOCONAZOLE 2 % EX CREA: TOPICAL_CREAM | CUTANEOUS | 1 refills | Status: DC

## 2024-03-06 NOTE — Patient Instructions (Signed)
 To prevent reinfection, spray shoes with lysol every evening.  Clean tub or shower with bleach based cleanser.  Athlete's Foot Athlete's foot (tinea pedis) is a fungal infection of the skin on your feet. It often occurs on the skin that is between or underneath the toes. It can also occur on the soles of your feet. The infection can spread from person to person (is contagious). It can also spread when a person's bare feet come in contact with the fungus on shower floors or on items such as shoes. What are the causes? This condition is caused by a fungus that grows in warm, moist places. You can get athlete's foot by sharing shoes, shower stalls, towels, and wet floors with someone who is infected. Not washing your feet or changing your socks often enough can also lead to athlete's foot. What increases the risk? This condition is more likely to develop in: Men. People who have a weak body defense system (immune system). People who have diabetes. People who use public showers, such as at a gym. People who wear heavy-duty shoes, such as industrial or military shoes. Seasons with warm, humid weather. What are the signs or symptoms? Symptoms of this condition include: Itchy areas between your toes or on the soles of your feet. White, flaky, or scaly areas between your toes or on the soles of your feet. Very itchy small blisters between your toes or on the soles of your feet. Small cuts in your skin. These cuts can become infected. Thick or discolored toenails. How is this diagnosed? This condition may be diagnosed with a physical exam and a review of your medical history. Your health care provider may also take a skin or toenail sample to examine under a microscope. How is this treated? This condition is treated with antifungal medicines. These may be applied as powders, ointments, or creams. In severe cases, an oral antifungal medicine may be given. Follow these instructions at  home: Medicines Apply or take over-the-counter and prescription medicines only as told by your health care provider. Apply your antifungal medicine as told by your health care provider. Do not stop using the antifungal even if your condition improves. Foot care Do not scratch your feet. Keep your feet dry: Wear cotton or wool socks. Change your socks every day or if they become wet. Wear shoes that allow air to flow, such as sandals or canvas tennis shoes. Wash and dry your feet, including the area between your toes. Also, wash and dry your feet: Every day or as told by your health care provider. After exercising. General instructions Do not let others use towels, shoes, nail clippers, or other personal items that touch your feet. Protect your feet by wearing sandals in wet areas, such as locker rooms and shared showers. Keep all follow-up visits. This is important. If you have diabetes, keep your blood sugar under control. Contact a health care provider if: You have a fever. You have swelling, soreness, warmth, or redness in your foot. Your feet are not getting better with treatment. Your symptoms get worse. You have new symptoms. You have severe pain. Summary Athlete's foot (tinea pedis) is a fungal infection of the skin on your feet. It often occurs on skin that is between or underneath the toes. This condition is caused by a fungus that grows in warm, moist places. Symptoms include white, flaky, or scaly areas between your toes or on the soles of your feet. This condition is treated with antifungal medicines.   Keep your feet clean. Always dry them thoroughly. This information is not intended to replace advice given to you by your health care provider. Make sure you discuss any questions you have with your health care provider. Document Revised: 01/12/2021 Document Reviewed: 01/12/2021 Elsevier Patient Education  2024 Elsevier Inc.  

## 2024-03-06 NOTE — Progress Notes (Signed)
 Subjective:  Patient ID: Kevin Huerta, male    DOB: December 22, 1965,  MRN: [id number]  58 y.o. male presents at risk foot care. Pt has h/o NIDDM with PAD and painful elongated mycotic toenails 1-5 bilaterally which are tender when wearing enclosed shoe gear. Pain is relieved with periodic professional debridement. He is followed by [facility] for PAD. States his revascularization procedure was a success and he was instructed to start walking for exercise. Chief Complaint  Patient presents with   Nail Problem    Pt is here for [facility] last A1C was 7.8 PCP is Dr Sueanne Emerald and LOV was in February.   Past Surgical History:  Procedure Laterality Date   ANTERIOR CERVICAL DECOMP/DISCECTOMY FUSION N/A 2014   ENDARTERECTOMY FEMORAL Left 08/04/2023   Procedure: ENDARTERECTOMY FEMORAL (SFA STENT PLACEMENT);  Surgeon: Celso College, MD;  Location: [facility] ORS;  Service: Vascular;  Laterality: Left;   KNEE SURGERY Right    meniscus   LEFT HEART CATH AND CORONARY ANGIOGRAPHY Left 11/27/2008   Procedure: LEFT HEART CATH AND CORONARY ANGIOGRAPHY; Location[facility]; Surgeon: Darleen Ee, MD   LOWER EXTREMITY ANGIOGRAPHY Right 11/16/2022   Procedure: Lower Extremity Angiography;  Surgeon: Celso College, MD;  Location: [facility] INVASIVE CV LAB;  Service: Cardiovascular;  Laterality: Right;   LOWER EXTREMITY ANGIOGRAPHY Left 03/22/2023   Procedure: Lower Extremity Angiography;  Surgeon: Celso College, MD;  Location: [facility] INVASIVE CV LAB;  Service: Cardiovascular;  Laterality: Left;   ROTATOR CUFF REPAIR Right    WISDOM TOOTH EXTRACTION     Allergies  Allergen Reactions   Aspirin  Tinitus   New problem(s): None   PCP is Valli Gaw, MD.  Allergies  Allergen Reactions   Aspirin  Tinitus    Review of Systems: Negative except as noted in the HPI.   Objective:  Kevin Huerta is a pleasant 58 y.o. male obese in NAD. AAO x 3.  Vascular Examination: Vascular status  intact b/l with palpable pedal pulses. CFT immediate b/l. Pedal hair present. No edema. No pain with calf compression b/l. Skin temperature gradient WNL b/l. No varicosities noted. No cyanosis or clubbing noted.  Neurological Examination: Sensation grossly intact b/l with 10 gram monofilament. Vibratory sensation intact b/l.  Dermatological Examination: Pedal skin dry and flaky. Hyperkeratotic lesion(s) right great toe.  No erythema, no edema, no drainage, no fluctuance. Diffuse scaling noted peripherally and plantarly b/l feet.  No interdigital macerations.  No blisters, no weeping. No signs of secondary bacterial infection noted. No open wounds nor interdigital macerations noted. Toenails 1-5 b/l thick, discolored, elongated with subungual debris and pain on dorsal palpation.   Musculoskeletal Examination: Muscle strength 5/5 to b/l LE.  No pain, crepitus noted b/l. HAV with bunion deformity noted b/l LE. Pes planus deformity noted bilateral LE.  Radiographs: None  Last A1c:      Latest Ref Rng & Units 08/04/2023    2:59 PM  Hemoglobin A1C  Hemoglobin-A1c 4.8 - 5.6 % 9.1      Assessment:   1. Pain due to onychomycosis of toenails of both feet [B35.1, M79.675, M79.674]   2. Callus   3. Tinea pedis of both feet   4. Xerosis cutis   5. Type II diabetes mellitus with peripheral circulatory disorder (HCC)    Plan:  \ Meds ordered this encounter  Medications   ketoconazole (NIZORAL) 2 % cream    Sig: Apply to both feet and between toes once daily for 6 weeks.  Dispense:  60 g    Refill:  1   Consent given for treatment. Patient examined. All patient's and/or POA's questions/concerns addressed on today's visit. Toenails 1-5 debrided in length and girth without incident. Continue foot and shoe inspections daily. Monitor blood glucose per PCP/Endocrinologist's recommendations. Continue soft, supportive shoe gear daily. Report any pedal injuries to medical professional. Call office  if there are any questions/concerns. -Callus(es) right great toe pared utilizing sterile scalpel blade without complication or incident. Total number debrided =1. -For dry skin, recommended daily use of Bag Balm Hand and Body Moistuizer which may be purchased at local drug store or on Amazon. -Discussed tinea pedis infection. To prevent re-infection of tinea pedis, patient/POA/caregiver instructed to spray shoes with Lysol every evening and clean tub/shower with bleach based cleanser. Rx sent to pharmacy for Ketoconazole Cream 2% to be applied to both feet and between toes once daily for six week.  -Patient/POA to call should there be question/concern in the interim.  Return in about 3 months (around 06/06/2024).  Luella Sager, DPM      [facility] LOCATION: [address] [place] [address]                   Office [phone]   [facility] LOCATION: [address] [place], [place] [address] Office [phone]

## 2024-03-08 ENCOUNTER — Emergency Department
Admission: EM | Admit: 2024-03-08 | Discharge: 2024-03-08 | Disposition: A | Discharge status: Home / Self Care | Attending: Emergency Medicine | Admitting: Emergency Medicine

## 2024-03-08 ENCOUNTER — Emergency Department

## 2024-03-08 ENCOUNTER — Ambulatory Visit: Payer: Self-pay

## 2024-03-08 ENCOUNTER — Ambulatory Visit
Admission: RE | Admit: 2024-03-08 | Discharge: 2024-03-08 | Disposition: A | Discharge status: Home / Self Care | Source: Ambulatory Visit | Attending: Emergency Medicine | Admitting: Emergency Medicine

## 2024-03-08 VITALS — BP 118/77 | HR 112 | Temp 100.1°F | Resp 18 | Ht 70.0 in | Wt 244.6 lb

## 2024-03-08 DIAGNOSIS — A084 Viral intestinal infection, unspecified: Secondary | ICD-10-CM | POA: Insufficient documentation

## 2024-03-08 DIAGNOSIS — E119 Type 2 diabetes mellitus without complications: Secondary | ICD-10-CM | POA: Insufficient documentation

## 2024-03-08 DIAGNOSIS — R1030 Lower abdominal pain, unspecified: Secondary | ICD-10-CM | POA: Diagnosis not present

## 2024-03-08 DIAGNOSIS — R55 Syncope and collapse: Secondary | ICD-10-CM | POA: Insufficient documentation

## 2024-03-08 DIAGNOSIS — J45909 Unspecified asthma, uncomplicated: Secondary | ICD-10-CM | POA: Diagnosis not present

## 2024-03-08 DIAGNOSIS — I2510 Atherosclerotic heart disease of native coronary artery without angina pectoris: Secondary | ICD-10-CM | POA: Diagnosis not present

## 2024-03-08 DIAGNOSIS — I10 Essential (primary) hypertension: Secondary | ICD-10-CM | POA: Insufficient documentation

## 2024-03-08 DIAGNOSIS — R42 Dizziness and giddiness: Secondary | ICD-10-CM | POA: Diagnosis present

## 2024-03-08 DIAGNOSIS — R000 Tachycardia, unspecified: Secondary | ICD-10-CM | POA: Diagnosis not present

## 2024-03-08 LAB — GLUCOSE, CAPILLARY: Glucose-Capillary: 189 mg/dL — ABNORMAL HIGH (ref 70–99)

## 2024-03-08 LAB — CBC WITH DIFFERENTIAL/PLATELET
Abs Immature Granulocytes: 0.01 10*3/uL (ref 0.00–0.07)
Basophils Absolute: 0 10*3/uL (ref 0.0–0.1)
Basophils Relative: 0 %
Eosinophils Absolute: 0 10*3/uL (ref 0.0–0.5)
Eosinophils Relative: 0 %
HCT: 49.4 % (ref 39.0–52.0)
Hemoglobin: 15.6 g/dL (ref 13.0–17.0)
Immature Granulocytes: 0 %
Lymphocytes Relative: 8 %
Lymphs Abs: 0.4 10*3/uL — ABNORMAL LOW (ref 0.7–4.0)
MCH: 26.2 pg (ref 26.0–34.0)
MCHC: 31.6 g/dL (ref 30.0–36.0)
MCV: 82.9 fL (ref 80.0–100.0)
Monocytes Absolute: 0.5 10*3/uL (ref 0.1–1.0)
Monocytes Relative: 10 %
Neutro Abs: 4.1 10*3/uL (ref 1.7–7.7)
Neutrophils Relative %: 82 %
Platelets: 276 10*3/uL (ref 150–400)
RBC: 5.96 MIL/uL — ABNORMAL HIGH (ref 4.22–5.81)
RDW: 15.3 % (ref 11.5–15.5)
WBC: 5 10*3/uL (ref 4.0–10.5)
nRBC: 0 % (ref 0.0–0.2)

## 2024-03-08 LAB — COMPREHENSIVE METABOLIC PANEL WITH GFR
ALT: 29 U/L (ref 0–44)
AST: 29 U/L (ref 15–41)
Albumin: 3.9 g/dL (ref 3.5–5.0)
Alkaline Phosphatase: 67 U/L (ref 38–126)
Anion gap: 10 (ref 5–15)
BUN: 26 mg/dL — ABNORMAL HIGH (ref 6–20)
CO2: 21 mmol/L — ABNORMAL LOW (ref 22–32)
Calcium: 8.5 mg/dL — ABNORMAL LOW (ref 8.9–10.3)
Chloride: 106 mmol/L (ref 98–111)
Creatinine, Ser: 1.01 mg/dL (ref 0.61–1.24)
GFR, Estimated: >60 mL/min (ref >60)
Glucose, Bld: 193 mg/dL — ABNORMAL HIGH (ref 70–99)
Potassium: 4 mmol/L (ref 3.5–5.1)
Sodium: 137 mmol/L (ref 135–145)
Total Bilirubin: 0.9 mg/dL (ref 0.0–1.2)
Total Protein: 6.8 g/dL (ref 6.5–8.1)

## 2024-03-08 LAB — RESP PANEL BY RT-PCR (RSV, FLU A&B, COVID)  RVPGX2
Influenza A by PCR: NEGATIVE
Influenza B by PCR: NEGATIVE
Resp Syncytial Virus by PCR: NEGATIVE
SARS Coronavirus 2 by RT PCR: NEGATIVE

## 2024-03-08 LAB — URINALYSIS, W/ REFLEX TO CULTURE (INFECTION SUSPECTED)
Bacteria, UA: NONE SEEN
Bilirubin Urine: NEGATIVE
Glucose, UA: >=500 mg/dL — AB
Hgb urine dipstick: NEGATIVE
Ketones, ur: 20 mg/dL — AB
Leukocytes,Ua: NEGATIVE
Nitrite: NEGATIVE
Protein, ur: NEGATIVE mg/dL
Specific Gravity, Urine: 1.037 — ABNORMAL HIGH (ref 1.005–1.030)
Squamous Epithelial / HPF: 0 /HPF (ref 0–5)
pH: 5 (ref 5.0–8.0)

## 2024-03-08 LAB — LACTIC ACID, PLASMA: Lactic Acid, Venous: 1.3 mmol/L (ref 0.5–1.9)

## 2024-03-08 LAB — LIPASE, BLOOD: Lipase: 32 U/L (ref 11–51)

## 2024-03-08 MED ORDER — IOHEXOL 300 MG/ML  SOLN
100.0000 mL | Freq: Once | INTRAMUSCULAR | Status: AC | PRN
  Administered 2024-03-08: 100 mL via INTRAVENOUS

## 2024-03-08 MED ORDER — SODIUM CHLORIDE 0.9 % IV BOLUS (SEPSIS)
1000.0000 mL | Freq: Once | INTRAVENOUS | Status: AC
  Administered 2024-03-08: 1000 mL via INTRAVENOUS

## 2024-03-08 MED ORDER — KETOROLAC TROMETHAMINE 15 MG/ML IJ SOLN
15.0000 mg | Freq: Once | INTRAMUSCULAR | Status: AC
  Administered 2024-03-08: 15 mg via INTRAVENOUS
  Filled 2024-03-08: qty 1

## 2024-03-08 MED ORDER — ONDANSETRON 4 MG PO TBDP
4.0000 mg | ORAL_TABLET | Freq: Once | ORAL | Status: AC
  Administered 2024-03-08: 4 mg via ORAL

## 2024-03-08 MED ORDER — ONDANSETRON 4 MG PO TBDP: 4.0000 mg | ORAL_TABLET | Freq: Three times a day (TID) | ORAL | 0 refills | Status: DC | PRN

## 2024-03-08 MED ORDER — ONDANSETRON HCL 4 MG/2ML IJ SOLN
4.0000 mg | Freq: Once | INTRAMUSCULAR | Status: AC
  Administered 2024-03-08: 4 mg via INTRAVENOUS
  Filled 2024-03-08: qty 2

## 2024-03-08 NOTE — ED Provider Notes (Signed)
 [facility] Provider Note    Event Date/Time   First MD Initiated Contact with Patient 03/08/24 1522     (approximate)   History   Chief Complaint: Abdominal Pain   HPI  Kevin Huerta is a 58 y.o. male with a history of hypertension, diabetes, obesity who comes ED complaining of lower abdominal pain since yesterday about 7:30 while eating dinner at [place]  [employer].  He finished eating and through the night continued to be nauseated, developed gradual onset lower abdominal pain, this morning had vomiting.  Went to urgent care where he got lightheaded and passed out and had stool incontinence.  Denies palpitations chest pain or shortness of breath.  Urgent care notes reviewed, also reported the patient came in contact with bat droppings yesterday prior to onset of illness.      Past Medical History:  Diagnosis Date   Allergic rhinitis, seasonal    Anginal equivalent (HCC)    Anxiety    a.) on BZO (alprazolam ) PRN   Aortic atherosclerosis (HCC)    Arthritis    Asthma    Atherosclerosis of native artery of both lower extremities with intermittent claudication (HCC)    a.) s/p PTA of RIGHT prox peroneal, prox SFA, popliteal, and distal SFA; 6 mm x 15 cm Viabahn stent to RIGHT distal SFA and above knee popliteal artery; b.) s/p unsuccessful PTA of occluded SFA 03/22/2023 --> plans for endarterectomy +/-  endoluminal intervention   BRBPR (bright red blood per rectum) 05/24/2020   Chest heaviness 10/27/2022   Coronary artery disease of native artery of native heart with stable angina pectoris (HCC) 03/29/2023   a.) cCTA 03/29/2023: Ca2+ 476 (95th percentile for age/sex/race matched control)   Current vaping on some days    DDD (degenerative disc disease), cervical    a.) s/p ACDF C3-C5 2014   Dermatitis 06/02/2021   Diarrhea 02/26/2021   Emphysema lung (HCC)    Fatty liver 09/03/2020   GERD (gastroesophageal reflux disease)    Gout    Heart murmur     History of cardiac catheterization 11/27/2008   a.) LHC 11/27/2008 at [facility] --> normal coronary anatomy with no obstructive CAD   Hyperlipidemia    Hypersomnia 07/17/2014   Hypertension    Insomnia    a.) uses melatonin +/- trazodone  PRN   Leg cramps 02/26/2021   Marijuana use, episodic    Occasional alcohol consumption    On long term clopidogrel  therapy    OSA (obstructive sleep apnea)    a.) not currently on nocturnal PAP therapy; needs repeat PSG   Seasonal allergies    T2DM (type 2 diabetes mellitus) (HCC)    a.) monitors with Freestyle Libre CGM   Tinnitus     Current Outpatient Rx   Order #: [id number] Class: Normal   Order #: [id number] Class: Normal   Order #: [id number] Class: Historical Med   Order #: [id number] Class: Historical Med   Order #: [id number] Class: Normal   Order #: [id number] Class: Historical Med   Order #: [id number] Class: Historical Med   Order #: [id number] Class: Normal   Order #: [id number] Class: Normal   Order #: [id number] Class: Historical Med   Order #: [id number] Class: Normal   Order #: [id number] Class: Normal   Order #: [id number] Class: Normal   Order #: [id number] Class: Normal   Order #: [id number] Class: Historical Med   Order #: [id number] Class: Normal   Order #: [id number] Class: Normal   Order #: [id number] Class: Historical Med   Order #: [id number] Class: Historical Med  Order #: [id number] Class: Historical Med   Order #: [id number] Class: Historical Med   Order #: [id number] Class: Historical Med   Order #: [id number] Class: Normal   Order #: [id number] Class: Historical Med   Order #: [id number] Class: Historical Med   Order #: [id number] Class: Historical Med   Order #: [id number] Class: Historical Med   Order #: [id number] Class: Historical Med   Order #: [id number] Class: Normal    Past Surgical History:  Procedure Laterality Date   ANTERIOR CERVICAL DECOMP/DISCECTOMY FUSION N/A 2014   ENDARTERECTOMY FEMORAL Left 08/04/2023   Procedure:  ENDARTERECTOMY FEMORAL (SFA STENT PLACEMENT);  Surgeon: Celso College, MD;  Location: [facility] ORS;  Service: Vascular;  Laterality: Left;   KNEE SURGERY Right    meniscus   LEFT HEART CATH AND CORONARY ANGIOGRAPHY Left 11/27/2008   Procedure: LEFT HEART CATH AND CORONARY ANGIOGRAPHY; Location: [facility]; Surgeon: Darleen Ee, MD   LOWER EXTREMITY ANGIOGRAPHY Right 11/16/2022   Procedure: Lower Extremity Angiography;  Surgeon: Celso College, MD;  Location: [facility] INVASIVE CV LAB;  Service: Cardiovascular;  Laterality: Right;   LOWER EXTREMITY ANGIOGRAPHY Left 03/22/2023   Procedure: Lower Extremity Angiography;  Surgeon: Celso College, MD;  Location: [facility] INVASIVE CV LAB;  Service: Cardiovascular;  Laterality: Left;   ROTATOR CUFF REPAIR Right    WISDOM TOOTH EXTRACTION      Physical Exam   Triage Vital Signs: ED Triage Vitals  Encounter Vitals Group     BP 03/08/24 1519 (!) 142/74     Systolic BP Percentile --      Diastolic BP Percentile --      Pulse Rate 03/08/24 1519 (!) 105     Resp 03/08/24 1519 16     Temp 03/08/24 1519 (!) 100.4 F (38 C)     Temp Source 03/08/24 1519 Oral     SpO2 03/08/24 1518 97 %     Weight 03/08/24 1521 240 lb (108.9 kg)     Height 03/08/24 1521 5\' 9"  (1.753 m)     Head Circumference --      Peak Flow --      Pain Score --      Pain Loc --      Pain Education --      Exclude from Growth Chart --     Most recent vital signs: Vitals:   03/08/24 1800 03/08/24 1830  BP: (!) 148/83 121/75  Pulse: 100 98  Resp: 15 (!) 21  Temp:    SpO2: 97% 97%    General: Awake, no distress.  CV:  Good peripheral perfusion.  Tachycardia heart rate 105 Resp:  Normal effort.  Clear to auscultation bilaterally Abd:  Mildly distended.  Soft with diffuse lower abdominal tenderness Other:  Dry oral mucosa   ED Results / Procedures / Treatments   Labs (all labs ordered are listed, but only abnormal results are displayed) Labs Reviewed   COMPREHENSIVE METABOLIC PANEL WITH GFR - Abnormal; Notable for the following components:      Result Value   CO2 21 (*)    Glucose, Bld 193 (*)    BUN 26 (*)    Calcium  8.5 (*)    All other components within normal limits  CBC WITH DIFFERENTIAL/PLATELET - Abnormal; Notable for the following components:   RBC 5.96 (*)    Lymphs Abs 0.4 (*)    All other components within normal limits  URINALYSIS, W/ REFLEX TO CULTURE (INFECTION SUSPECTED) - Abnormal; Notable for the following components:   Color,  Urine YELLOW (*)    APPearance CLEAR (*)    Specific Gravity, Urine 1.037 (*)    Glucose, UA >=500 (*)    Ketones, ur 20 (*)    All other components within normal limits  RESP PANEL BY RT-PCR (RSV, FLU A&B, COVID)  RVPGX2  CULTURE, BLOOD (ROUTINE X 2)  CULTURE, BLOOD (ROUTINE X 2)  LACTIC ACID, PLASMA  LIPASE, BLOOD  LACTIC ACID, PLASMA     EKG Interpreted by me Sinus tachycardia rate 103.  Normal axis and intervals.  Poor R wave progression.  No acute ischemic changes.   RADIOLOGY Chest x-ray interpreted by me, appears normal.  Radiology report reviewed.  CT abdomen pelvis unremarkable   PROCEDURES:  Procedures   MEDICATIONS ORDERED IN ED: Medications  sodium chloride  0.9 % bolus 1,000 mL (0 mLs Intravenous Stopped 03/08/24 1630)  ondansetron  (ZOFRAN ) injection 4 mg (4 mg Intravenous Given 03/08/24 1538)  ketorolac (TORADOL) 15 MG/ML injection 15 mg (15 mg Intravenous Given 03/08/24 1539)  iohexol  (OMNIPAQUE ) 300 MG/ML solution 100 mL (100 mLs Intravenous Contrast Given 03/08/24 1614)     IMPRESSION / MDM / ASSESSMENT AND PLAN / ED COURSE  I reviewed the triage vital signs and the nursing notes.  DDx: Appendicitis, diverticulitis, bowel obstruction, GI perforation, intra-abdominal abscess, UTI, sepsis, electrolyte derangement  Patient's presentation is most consistent with acute presentation with potential threat to life or bodily function.  Patient presents with lower  abdominal pain with syncope, has mild fever and tachycardia, concerning for early sepsis.  Will obtain labs, CT abdomen pelvis.  Zofran  Toradol and IV fluids for initial supportive care.   ----------------------------------------- 7:03 PM on 03/08/2024 ----------------------------------------- Chest x-ray, CT, labs all reassuring.  Lactate normal, vital signs improved with IV fluids.  Sepsis is ruled out.  Presentation is consistent with a viral gastroenteritis causing dehydration and vasovagal syncope.  Will continue supportive care at home.  Considered admission but with reassuring workup and improvement and now tolerating oral intake, he is stable for discharge.      FINAL CLINICAL IMPRESSION(S) / ED DIAGNOSES   Final diagnoses:  Viral gastroenteritis  Vasovagal syncope     Rx / DC Orders   ED Discharge Orders          Ordered    ondansetron  (ZOFRAN -ODT) 4 MG disintegrating tablet  Every 8 hours PRN        03/08/24 1901             Note:  This document was prepared using Dragon voice recognition software and may include unintentional dictation errors.   Jacquie Maudlin, MD 03/08/24 [phone]

## 2024-03-08 NOTE — ED Notes (Signed)
 Patient back from CT scan. Monitor in place. Family at the bedside.

## 2024-03-08 NOTE — ED Provider Notes (Signed)
 [facility]    CSN: [id number] Arrival date & time: 03/08/24  1344      History   Chief Complaint Chief Complaint  Patient presents with   Nausea   Abdominal Pain   Diarrhea    HPI Kevin Huerta is a 58 y.o. male.   HPI  58 year old male with past medical history significant for type 2 diabetes, hypertension, hyperlipidemia, asthma, fatty liver, CAD, BRBPR, OSA, emphysema, and GERD presents for evaluation of lower abdominal pain.  The pain started in the middle of the night and is associated with significant nausea as well as diarrhea.  Patient has not had any vomiting, denies fever being measured at home, or observing blood in his stool.  His appetite has been decreased.  The patient's wife reports that he came in contact with bat droppings yesterday and she is concerned this may be playing a role.  Past Medical History:  Diagnosis Date   Allergic rhinitis, seasonal    Anginal equivalent (HCC)    Anxiety    a.) on BZO (alprazolam ) PRN   Aortic atherosclerosis (HCC)    Arthritis    Asthma    Atherosclerosis of native artery of both lower extremities with intermittent claudication (HCC)    a.) s/p PTA of RIGHT prox peroneal, prox SFA, popliteal, and distal SFA; 6 mm x 15 cm Viabahn stent to RIGHT distal SFA and above knee popliteal artery; b.) s/p unsuccessful PTA of occluded SFA 03/22/2023 --> plans for endarterectomy +/-  endoluminal intervention   BRBPR (bright red blood per rectum) 05/24/2020   Chest heaviness 10/27/2022   Coronary artery disease of native artery of native heart with stable angina pectoris (HCC) 03/29/2023   a.) cCTA 03/29/2023: Ca2+ 476 (95th percentile for age/sex/race matched control)   Current vaping on some days    DDD (degenerative disc disease), cervical    a.) s/p ACDF C3-C5 2014   Dermatitis 06/02/2021   Diarrhea 02/26/2021   Emphysema lung (HCC)    Fatty liver 09/03/2020   GERD (gastroesophageal reflux disease)    Gout     Heart murmur    History of cardiac catheterization 11/27/2008   a.) LHC 11/27/2008 at [facility] --> normal coronary anatomy with no obstructive CAD   Hyperlipidemia    Hypersomnia 07/17/2014   Hypertension    Insomnia    a.) uses melatonin +/- trazodone  PRN   Leg cramps 02/26/2021   Marijuana use, episodic    Occasional alcohol consumption    On long term clopidogrel  therapy    OSA (obstructive sleep apnea)    a.) not currently on nocturnal PAP therapy; needs repeat PSG   Seasonal allergies    T2DM (type 2 diabetes mellitus) (HCC)    a.) monitors with Freestyle Libre CGM   Tinnitus     Patient Active Problem List   Diagnosis Date Noted   COVID-19 11/10/2023   Atherosclerosis of artery of extremity with rest pain (HCC) 08/04/2023   Marijuana use, episodic 04/12/2023   Tinnitus aurium, left 11/27/2022   DOE (dyspnea on exertion) 10/27/2022   Bronchitis 06/26/2022   Chronic bilateral low back pain with right-sided sciatica 06/15/2022   Tinea 05/07/2022   Right wrist pain 02/26/2021   Pain due to onychomycosis of toenails of both feet 01/30/2021   Atherosclerosis of aorta (HCC) 11/07/2020   Coronary artery disease 11/07/2020   Emphysema lung (HCC) 11/07/2020   History of tobacco use 10/15/2020   Hyperlipidemia associated with type 2 diabetes mellitus (  HCC) 10/15/2020   Insomnia 10/15/2020   Fatty liver 09/03/2020   Left wrist pain 01/29/2020   Chronic arthropathy 01/29/2020   Chronic gouty arthropathy 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 11/24/2017   OSA (obstructive sleep apnea) 07/17/2014   Snoring 07/17/2014   Cervical radiculopathy 01/22/2011   Hypertension associated with diabetes (HCC) 01/22/2011   Type 2 diabetes mellitus without complication, without long-term current use of insulin  (HCC) 01/22/2011   Eczema 04/15/2010   Hyperlipidemia LDL goal <100 03/13/2010   Generalized anxiety disorder 12/17/2009   Allergic rhinitis 07/30/2009   Asthma 07/29/2009   Depression  03/31/2007   Gout 03/31/2007   Herpes simplex 03/31/2007    Past Surgical History:  Procedure Laterality Date   ANTERIOR CERVICAL DECOMP/DISCECTOMY FUSION N/A 2014   ENDARTERECTOMY FEMORAL Left 08/04/2023   Procedure: ENDARTERECTOMY FEMORAL (SFA STENT PLACEMENT);  Surgeon: Celso College, MD;  Location: [facility] ORS;  Service: Vascular;  Laterality: Left;   KNEE SURGERY Right    meniscus   LEFT HEART CATH AND CORONARY ANGIOGRAPHY Left 11/27/2008   Procedure: LEFT HEART CATH AND CORONARY ANGIOGRAPHY; Location: [facility]; Surgeon: Darleen Ee, MD   LOWER EXTREMITY ANGIOGRAPHY Right 11/16/2022   Procedure: Lower Extremity Angiography;  Surgeon: Celso College, MD;  Location: [facility] INVASIVE CV LAB;  Service: Cardiovascular;  Laterality: Right;   LOWER EXTREMITY ANGIOGRAPHY Left 03/22/2023   Procedure: Lower Extremity Angiography;  Surgeon: Celso College, MD;  Location: [facility] INVASIVE CV LAB;  Service: Cardiovascular;  Laterality: Left;   ROTATOR CUFF REPAIR Right    WISDOM TOOTH EXTRACTION         Home Medications    Prior to Admission medications   Medication Sig Start Date End Date Taking? Authorizing Provider  albuterol  (VENTOLIN  HFA) 108 (90 Base) MCG/ACT inhaler USE 2 INHALATIONS EVERY 6 HOURS AS NEEDED FOR WHEEZING OR SHORTNESS OF BREATH 01/27/24  Yes Valli Gaw, MD  allopurinol  (ZYLOPRIM ) 100 MG tablet Take 200 mg by mouth daily. 02/21/18  Yes [provider]  ALPRAZolam  (XANAX ) 0.5 MG tablet Take 0.5 mg by mouth as needed. 10/17/18  Yes [provider]  atorvastatin  (LIPITOR) 80 MG tablet TAKE 1 TABLET BY MOUTH EVERY DAY 07/28/23  Yes Valli Gaw, MD  Blood Glucose Monitoring Suppl (ONETOUCH VERIO FLEX SYSTEM) w/Device KIT by Does not apply route. 06/04/17  Yes [provider]  cetirizine (ZYRTEC) 10 MG tablet Take 10 mg by mouth 2 (two) times daily.   Yes [provider]  clopidogrel  (PLAVIX ) 75 MG tablet Take 1 tablet (75  mg total) by mouth daily. 08/07/23  Yes Pace, Brien R, NP  clotrimazole -betamethasone  (LOTRISONE ) cream APPLY 1 APPLICATION TOPICALLY DAILY 08/21/23  Yes Standiford, Alexander F, DPM  colchicine 0.6 MG tablet Take 0.6 mg by mouth 2 (two) times daily as needed. 06/20/19  Yes [provider]  Continuous Blood Gluc Sensor (FREESTYLE LIBRE 2 SENSOR) MISC USE TO CHECK GLUCOSE THREE TIMES A DAY 12/02/22  Yes Valli Gaw, MD  cyclobenzaprine  (FLEXERIL ) 5 MG tablet Take 1 tablet (5 mg total) by mouth 3 (three) times daily as needed for muscle spasms. 08/25/23  Yes Brown, Fallon E, NP  fluticasone  (FLONASE ) 50 MCG/ACT nasal spray Place 2 sprays into both nostrils daily. Patient taking differently: Place 2 sprays into both nostrils daily as needed. 11/23/22  Yes Valli Gaw, MD  Fluticasone -Umeclidin-Vilant (TRELEGY ELLIPTA ) 100-62.5-25 MCG/ACT AEPB Inhale 1 Act into the lungs daily. 01/25/24  Yes Kasa, Kurian, MD  glipiZIDE  (GLUCOTROL  XL) 10 MG 24  hr tablet Take 10 mg by mouth. 03/17/18  Yes [provider]  ipratropium (ATROVENT ) 0.06 % nasal spray Place 2 sprays into both nostrils 4 (four) times daily. 06/13/21  Yes Kent Pear, NP  ketoconazole (NIZORAL) 2 % cream Apply to both feet and between toes once daily for 6 weeks. 03/06/24  Yes Luella Sager, DPM  Melatonin 10 MG CAPS Take 10 mg by mouth every evening.   Yes [provider]  Multiple Vitamin (MULTI-VITAMIN) tablet Take 1 tablet by mouth daily.   Yes [provider]  OneTouch Delica Lancets 33G MISC by Does not apply route. 06/04/17  Yes [provider]  ONETOUCH VERIO test strip  08/15/18  Yes [provider]  OVER THE COUNTER MEDICATION Take 600 mg by mouth daily. Goli Ashwagandha Gummies   Yes [provider]  pantoprazole  (PROTONIX ) 40 MG tablet Take 1 tablet (40 mg total) by mouth daily. 09/05/23  Yes Valli Gaw, MD  pioglitazone  (ACTOS ) 30 MG tablet Take 30 mg by mouth daily.    Yes [provider]  Probiotic Product (PROBIOTIC DAILY PO) Take 1 capsule by mouth daily.   Yes [provider]  Semaglutide, 2 MG/DOSE, (OZEMPIC, 2 MG/DOSE,) 8 MG/3ML SOPN Inject 2 mg into the skin once a week. 03/04/21  Yes [provider]  SYNJARDY  XR 12.02-999 MG TB24 Take 2 tablets by mouth daily before breakfast. 03/05/21  Yes [provider]  traZODone  (DESYREL ) 100 MG tablet Take 100 mg by mouth at bedtime. 03/23/21  Yes [provider]  valsartan  (DIOVAN ) 320 MG tablet TAKE 1 TABLET BY MOUTH EVERY DAY 02/21/23  Yes Valli Gaw, MD    Family History Family History  Problem Relation Age of Onset   Heart attack Father 25   Alcohol abuse Father    Drug abuse Father    Heart disease Father    Lung cancer Father    Hypertension Mother    Heart attack Maternal Grandmother 21   Stroke Maternal Aunt 62    Social History Social History   Tobacco Use   Smoking status: Former    Current packs/day: 0.00    Average packs/day: 1.5 packs/day for 32.0 years (48.0 ttl pk-yrs)    Types: Cigarettes, E-cigarettes    Start date: 06/11/1986    Quit date: 06/11/2018    Years since quitting: 5.7   Smokeless tobacco: Former    Types: Snuff    Quit date: 1996   Tobacco comments:    I vape the marijuana from time to time, no tobacco products. - DJM 12/02/2023  Vaping Use   Vaping status: Some Days   Substances: Nicotine   Devices: x3 per day to quit smoking nicotine   Substance Use Topics   Alcohol use: Yes    Comment: cut back, 6 pack per month   Drug use: Yes    Types: Marijuana    Comment: a few times a year     Allergies   Aspirin    Review of Systems Review of Systems  Constitutional:  Positive for chills and diaphoresis. Negative for fever.  Gastrointestinal:  Positive for diarrhea and nausea. Negative for blood in stool and vomiting.     Physical Exam Triage Vital Signs ED Triage Vitals  Encounter Vitals Group     BP       Systolic BP Percentile      Diastolic BP Percentile      Pulse      Resp  Temp      Temp src      SpO2      Weight      Height      Head Circumference      Peak Flow      Pain Score      Pain Loc      Pain Education      Exclude from Growth Chart    No data found.  Updated Vital Signs BP 118/77 (BP Location: Left Arm)   Pulse (!) 112   Temp 100.1 F (37.8 C) (Oral)   Resp 18   Ht 5\' 10"  (1.778 m)   Wt 244 lb 9.6 oz (111 kg)   SpO2 95%   BMI 35.10 kg/m   Visual Acuity Right Eye Distance:   Left Eye Distance:   Bilateral Distance:    Right Eye Near:   Left Eye Near:    Bilateral Near:     Physical Exam Vitals and nursing note reviewed.  Constitutional:      Appearance: Normal appearance. He is ill-appearing.  HENT:     Head: Normocephalic and atraumatic.  Cardiovascular:     Rate and Rhythm: Normal rate and regular rhythm.     Pulses: Normal pulses.     Heart sounds: Normal heart sounds. No murmur heard.    No friction rub. No gallop.  Pulmonary:     Effort: Pulmonary effort is normal.     Breath sounds: Normal breath sounds. No wheezing, rhonchi or rales.  Abdominal:     Palpations: Abdomen is soft.     Tenderness: There is abdominal tenderness. There is no guarding or rebound.     Comments: Abdomen is protuberant but soft.  Tenderness in the left lower quadrant as well as in right lower quadrant over McBurney's point.  Skin:    General: Skin is warm and dry.     Capillary Refill: Capillary refill takes less than 2 seconds.     Findings: No rash.  Neurological:     Mental Status: He is alert.      UC Treatments / Results  Labs (all labs ordered are listed, but only abnormal results are displayed) Labs Reviewed  GLUCOSE, CAPILLARY - Abnormal; Notable for the following components:      Result Value   Glucose-Capillary 189 (*)    All other components within normal limits  CBG MONITORING, ED    EKG Sinus tachycardia with a ventricular rate  of 103 beats per minute PR interval 140 ms QRS duration 76 ms QT/QTc 322/421 ms Possible left atrial enlargement.  No ST or T wave abnormalities noted.  Radiology No results found.  Procedures Procedures (including critical care time)  Medications Ordered in UC Medications  ondansetron  (ZOFRAN -ODT) disintegrating tablet 4 mg (4 mg Oral Given 03/08/24 1421)    Initial Impression / Assessment and Plan / UC Course  I have reviewed the triage vital signs and the nursing notes.  Pertinent labs & imaging results that were available during my care of the patient were reviewed by me and considered in my medical decision making (see chart for details).   Patient is a pleasant, though ill-appearing, 58 year old male presenting for evaluation of lower abdominal pain, anorexia, nausea, and diarrhea that began last night.  He is currently with an oral temp of 100.1 and pulse rate of 112 here in triage.  The patient's wife reports that the patient removed some fat droppings with a cold but was not wearing a  mask and she is concerned this may be playing a role.  In the exam room patient's abdomen is protuberant though soft.  Patient has tenderness in the left lower quadrant as well as right lower quadrant over McBurney's point.  No rebound.  I feel the patient is to be evaluated in the emergency department and have a CT of his abdomen.  He has wife elected to go to [facility].  I will order 4 mg of ODT Zofran  to help patient with his nausea.  At discharge patient had a syncopal event.  You could time arrived at the bedside the patient was starting to come around and was responsive.  He was diaphoretic and had stooled on himself.  He is still complaining of abdominal pain and nausea.  Repeat blood pressure was 108/89 and fingerstick blood sugar was 189.  EKG was ordered which shows sinus tachycardia without ST or T wave abnormalities.  No significant change when compared to EKG from 04/06/2023.  I will have staff  start a line and will send patient to [facility] via EMS.  18-gauge saline lock placed in right AC.  Report called to Jerryl Morin, the charge nurse in the emergency department at [facility].  Report given to paramedics from [facility].  Care transferred.   Final Clinical Impressions(s) / UC Diagnoses   Final diagnoses:  Lower abdominal pain  Syncope, unspecified syncope type     Discharge Instructions      As we discussed, your symptoms could be coming from diverticulitis or possible inflamed appendix.  Please go to the emergency department for further evaluation of your abdominal pain and for imaging of your abdomen.  Nothing to eat or drink till after you have been evaluated in the ER.   ED Prescriptions   None    PDMP not reviewed this encounter.   Kent Pear, NP 03/08/24 1444

## 2024-03-08 NOTE — ED Notes (Signed)
 Patient is being discharged from the Urgent Care and sent to the Emergency Department via EMS . Per Kent Pear NP, patient is in need of higher level of care due to lower abd pain & syncope. Patient is aware and verbalizes understanding of plan of care.  Vitals:   03/08/24 1405  BP: 118/77  Pulse: (!) 112  Resp: 18  Temp: 100.1 F (37.8 C)  SpO2: 95%

## 2024-03-08 NOTE — Discharge Instructions (Addendum)
 Your CT scan and lab tests are okay today. Your illness appears to be due to a viral infection, which caused dehydration and passing out today.  Continue taking zofran  for nausea and tylenol  1000mg  every 8 hours and/or ibuprofen 600mg  every 6 hours for pain and fever control. Stick to a simple diet and lots of fluids for the next few days while this illness runs its course.

## 2024-03-08 NOTE — ED Triage Notes (Signed)
 Pt BIB EMS from UC for abdominal pain since last night. While at the UC, pt had a reported syncopal episode. Pt alert and oriented at triage.

## 2024-03-08 NOTE — ED Notes (Signed)
 MD at bedside.

## 2024-03-08 NOTE — Telephone Encounter (Signed)
 FYI Only or Action Required?: FYI only for provider  Patient was last seen in primary care on 03/08/2024 by Oksana Bergamo, RN. Called Nurse Triage reporting Nausea. Symptoms began yesterday. Interventions attempted: Nothing. Symptoms are: gradually worsening.  Triage Disposition: to ER  Patient/caregiver understands and will follow disposition?: Yes Answer Assessment - Initial Assessment Questions 1. NAUSEA SEVERITY: "How bad is the nausea?" (e.g., mild, moderate, severe; dehydration, weight loss)   - MILD: loss of appetite without change in eating habits   - MODERATE: decreased oral intake without significant weight loss, dehydration, or malnutrition   - SEVERE: inadequate caloric or fluid intake, significant weight loss, symptoms of dehydration     Moderate; was exposed to bat dropping. 2. ONSET: "When did the nausea begin?"     yesterday 3. VOMITING: "Any vomiting?" If Yes, ask: "How many times today?"     Did not, but felt like it. 4. RECURRENT SYMPTOM: "Have you had nausea before?" If Yes, ask: "When was the last time?" "What happened that time?"     No, states he had stomach cramps and overall not feeling well.  5. CAUSE: "What do you think is causing the nausea?"     Bat droppings, he removed them from the yard without protection gear. 6. PREGNANCY: "Is there any chance you are pregnant?" (e.g., unprotected intercourse, missed birth control pill, broken condom)     na  Protocols used: Nausea-A-AH

## 2024-03-08 NOTE — ED Triage Notes (Signed)
 Pt c/o lower abd pain,nausea & diarrhea x1 day. Denies any emesis. Has tried OTC meds w/o relief. Is able to keep foods & fluids. Hx of gastroparesis.

## 2024-03-08 NOTE — ED Notes (Signed)
 Patient to CT via stretcher.

## 2024-03-08 NOTE — ED Notes (Signed)
 MD made aware family would like to speak to him prior to discharge.

## 2024-03-08 NOTE — ED Notes (Signed)
 Patient provided ice water and crackers for PO challenge.

## 2024-03-08 NOTE — Discharge Instructions (Signed)
 As we discussed, your symptoms could be coming from diverticulitis or possible inflamed appendix.  Please go to the emergency department for further evaluation of your abdominal pain and for imaging of your abdomen.  Nothing to eat or drink till after you have been evaluated in the ER.

## 2024-03-09 ENCOUNTER — Ambulatory Visit: Admitting: Family Medicine

## 2024-03-09 ENCOUNTER — Encounter: Payer: Self-pay | Admitting: Family Medicine

## 2024-03-09 VITALS — BP 128/66 | HR 95 | Temp 98.5°F | Resp 20 | Ht 69.0 in | Wt 235.4 lb

## 2024-03-09 DIAGNOSIS — A084 Viral intestinal infection, unspecified: Secondary | ICD-10-CM

## 2024-03-09 NOTE — Progress Notes (Signed)
 SUBJECTIVE:   Chief Complaint  Patient presents with   Follow-up   HPI Presents for follow up ED visit   Discussed the use of AI scribe software for clinical note transcription with the patient, who gave verbal consent to proceed.  History of Present Illness Kevin Huerta is a 58 year old male with diabetes who presents for follow-up after an emergency department visit for gastrointestinal symptoms and syncope. He is accompanied by Gurney Lefort, his partner.  He experienced gastrointestinal symptoms starting on Tuesday evening after dining at [employer]. He felt nauseous during dinner, which he initially attributed to low blood sugar, and attempted to alleviate it by consuming sweet tea and hard candy. However, his symptoms persisted, and he developed stomach cramps throughout the night, leading to a sleepless night.  On Wednesday morning, he felt unwell and decided not to go to work. He attempted to rest but was later advised by Gurney Lefort to visit urgent care due to persistent symptoms. At urgent care, a CT scan was recommended. However, due to the lack of a technician, he was advised to go to the emergency room.  While at urgent care, he experienced an episode of loss of vision, sweating, and fainting. This episode led to an ambulance transfer to the emergency room, where he was treated with fluids, Zofran , and Toradol . He was monitored for six hours, and preliminary tests were performed.  He reports feeling much better today, though he still experiences some nausea and fatigue. He has had episodes of diarrhea, including nocturnal incontinence, but has not had any bowel movements today. No abdominal pain since receiving pain medication and no blood in his stool.  He had a fever of 100.25F at urgent care, and he experienced sweating on Tuesday night. He has not had a fever since then.     PERTINENT PMH / PSH: As above  OBJECTIVE:  BP 128/66   Pulse 95   Temp 98.5 F (36.9 C)    Resp 20   Ht 5\' 9"  (1.753 m)   Wt 235 lb 6 oz (106.8 kg)   SpO2 99%   BMI 34.76 kg/m    Physical Exam Vitals reviewed.  Constitutional:      General: He is not in acute distress.    Appearance: Normal appearance. He is not ill-appearing, toxic-appearing or diaphoretic.  Eyes:     General:        Right eye: No discharge.        Left eye: No discharge.  Cardiovascular:     Rate and Rhythm: Normal rate and regular rhythm.     Heart sounds: Normal heart sounds.  Pulmonary:     Effort: Pulmonary effort is normal.     Breath sounds: Normal breath sounds.  Abdominal:     General: Bowel sounds are normal. There is no distension.     Tenderness: There is no abdominal tenderness.  Musculoskeletal:        General: Normal range of motion.     Cervical back: Normal range of motion.  Skin:    General: Skin is warm and dry.  Neurological:     Mental Status: He is alert and oriented to person, place, and time. Mental status is at baseline.  Psychiatric:        Mood and Affect: Mood normal.        Behavior: Behavior normal.        Thought Content: Thought content normal.        Judgment: Judgment  normal.           03/09/2024    2:02 PM 10/27/2022    9:01 AM 06/26/2022    4:19 PM 06/15/2022   10:43 AM 06/01/2022    3:04 PM  Depression screen PHQ 2/9  Decreased Interest 0 0 0 0 0  Down, Depressed, Hopeless 0 0 0 0 0  PHQ - 2 Score 0 0 0 0 0  Altered sleeping 1   2   Tired, decreased energy 1   0   Change in appetite 1   0   Feeling bad or failure about yourself  0   0   Trouble concentrating 1   0   Moving slowly or fidgety/restless 0   1   Suicidal thoughts 0   0   PHQ-9 Score 4   3   Difficult doing work/chores Somewhat difficult   Not difficult at all       03/09/2024    2:02 PM 11/23/2022    2:57 PM 06/15/2022   10:44 AM 10/15/2020    4:45 PM  GAD 7 : Generalized Anxiety Score  Nervous, Anxious, on Edge 1 2 1 2   Control/stop worrying 0 0 0 0  Worry too much - different  things 0 0 1 0  Trouble relaxing 1 2 1 2   Restless 1 2 1 1   Easily annoyed or irritable 1 0 0 1  Afraid - awful might happen 0 0 0 0  Total GAD 7 Score 4 6 4 6   Anxiety Difficulty Not difficult at all Somewhat difficult Not difficult at all Not difficult at all    ASSESSMENT/PLAN:  Viral gastroenteritis Assessment & Plan: Was evaluated in ED yesterday.  Imaging and labs reassuring.  Feeling much better today.  No fever or diarrhea today.  Continue BRAT diet Continue to hydrate Continue prescribed medication from ED for nausea if needed Has appointment scheduled with GI. Can follow up if worsening symptoms      PDMP reviewed  Return if symptoms worsen or fail to improve, for PCP.  Valli Gaw, MD

## 2024-03-09 NOTE — Patient Instructions (Signed)
 It was a pleasure meeting you today. Thank you for allowing me to take part in your health care.  Our goals for today as we discussed include:  Continue medications as prescribed by ED provider  Follow up with GI as scheduled  If any worsening symptoms please, unable to hydrate, increasing diarrhea, bloody stool.  Go to ED  This is a list of the screening recommended for you and due dates:  Health Maintenance  Topic Date Due   Pneumococcal Vaccination (2 of 2 - PCV) 08/25/2013   Yearly kidney health urinalysis for diabetes  02/18/2023   COVID-19 Vaccine (6 - 2024-25 season) 06/06/2023   Screening for Lung Cancer  03/28/2024   Flu Shot  05/05/2024   Complete foot exam   05/30/2024   Hemoglobin A1C  07/20/2024   Eye exam for diabetics  11/07/2024   Yearly kidney function blood test for diabetes  03/08/2025   DTaP/Tdap/Td vaccine (5 - Td or Tdap) 12/24/2026   Colon Cancer Screening  08/12/2030   Hepatitis C Screening  Completed   HIV Screening  Completed   Zoster (Shingles) Vaccine  Completed   HPV Vaccine  Aged Out   Meningitis B Vaccine  Aged Out      If you have any questions or concerns, please do not hesitate to call the office at [phone].  I look forward to our next visit and until then take care and stay safe.  Regards,   Valli Gaw, MD   [facility]

## 2024-03-12 ENCOUNTER — Encounter: Payer: Self-pay | Admitting: Family Medicine

## 2024-03-12 DIAGNOSIS — A084 Viral intestinal infection, unspecified: Secondary | ICD-10-CM | POA: Insufficient documentation

## 2024-03-12 NOTE — Assessment & Plan Note (Signed)
 Was evaluated in ED yesterday.  Imaging and labs reassuring.  Feeling much better today.  No fever or diarrhea today.  Continue BRAT diet Continue to hydrate Continue prescribed medication from ED for nausea if needed Has appointment scheduled with GI. Can follow up if worsening symptoms

## 2024-03-13 LAB — CULTURE, BLOOD (ROUTINE X 2)
Culture: NO GROWTH
Special Requests: ADEQUATE

## 2024-04-28 ENCOUNTER — Encounter: Admitting: Family Medicine

## 2024-04-29 IMAGING — MR MR LUMBAR SPINE W/O CM
4 of 6 series · 21 of 48 positions shown · non-contrast
Comparison: None Available.

CLINICAL DATA: Pain, weakness and numbness in the posterior right
leg for 6 months. No known injury.

EXAM:
MRI LUMBAR SPINE WITHOUT CONTRAST
TECHNIQUE: Multiplanar, multisequence MR imaging of the lumbar spine was
performed. No intravenous contrast was administered.

[Series 6: T2 · sagittal · 4.0mm · 0.73mm/px · 4 of 15 slices shown (1 of 3)]
[im 1/15]
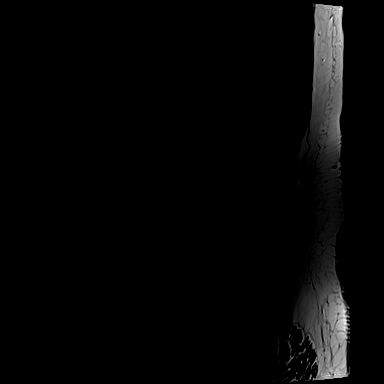
[im 5/15]
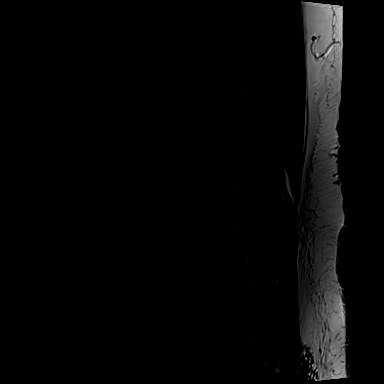
[im 10/15]
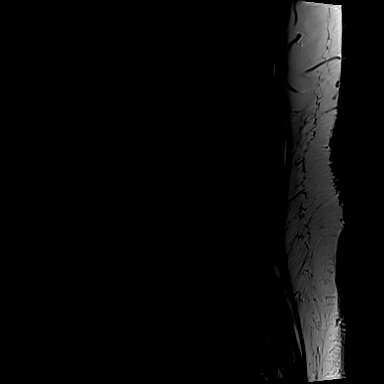
[im 15/15]
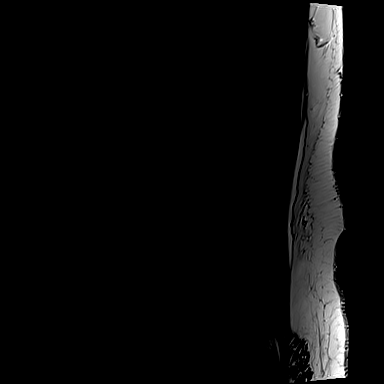

[Series 7: T1 · sagittal · 4.0mm · 0.73mm/px · 3 of 15 slices shown]
[im 1/15]
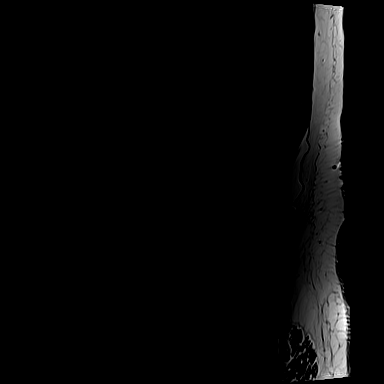
[im 8/15]
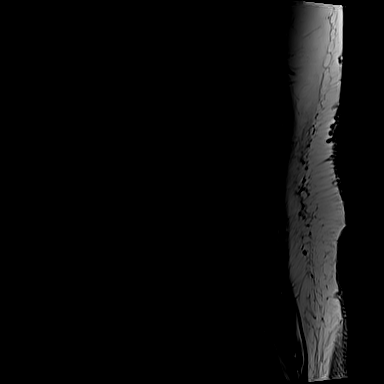
[im 15/15]
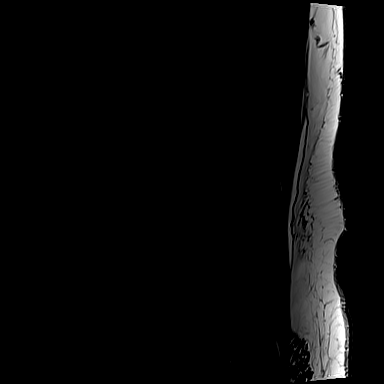

[Series 14: T2 · axial · 4.0mm · 0.28mm/px · z∈[-133,+95]mm · 8 of 42 slices shown (2 of 3)]
[im 1/42]
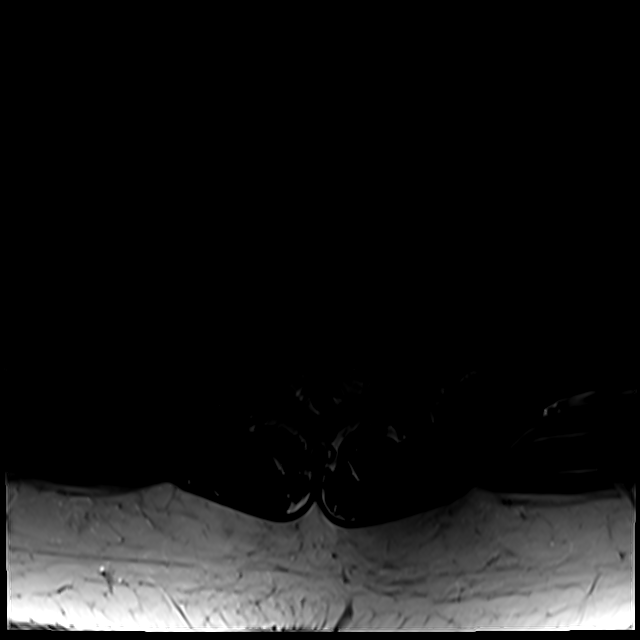
[im 7/42]
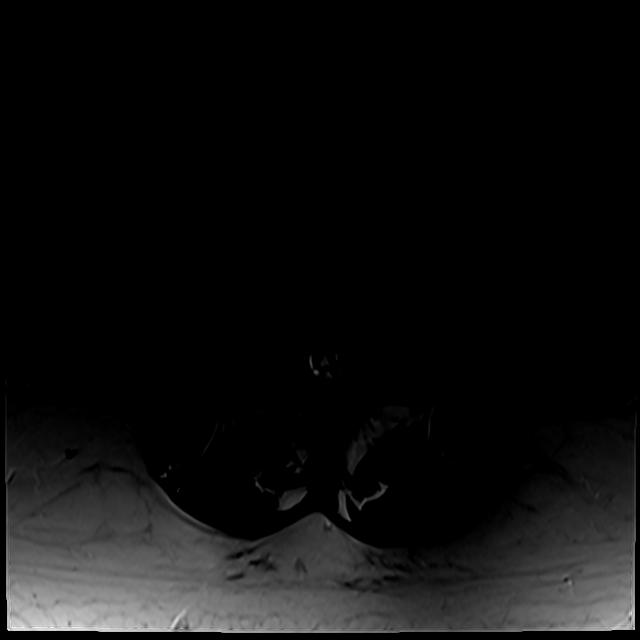
[im 13/42]
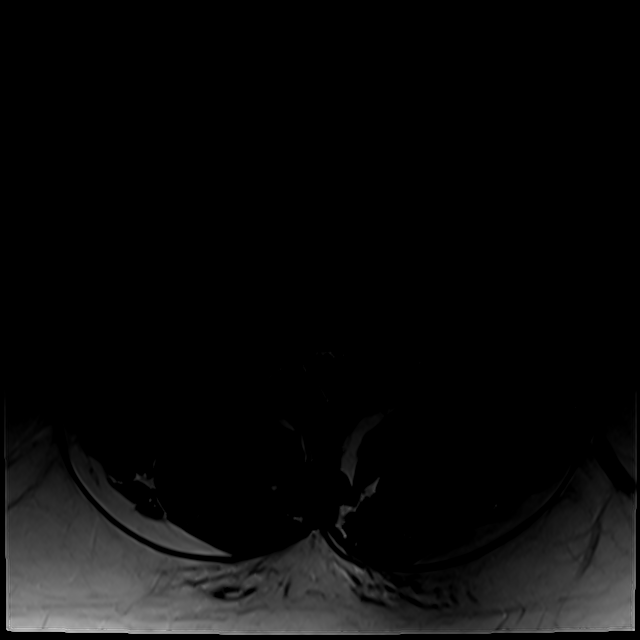
[im 19/42]
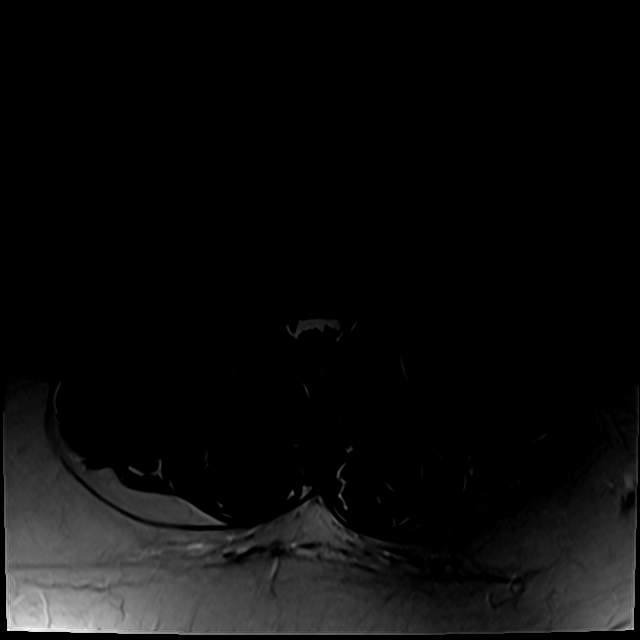
[im 23/42]
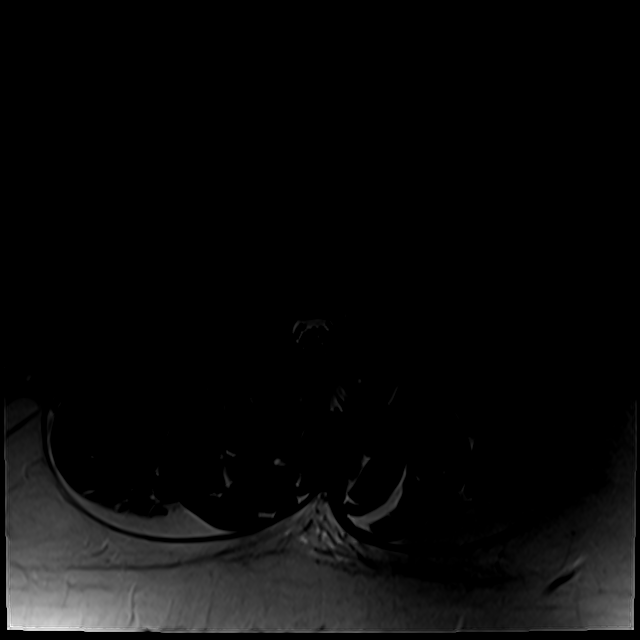
[im 29/42]
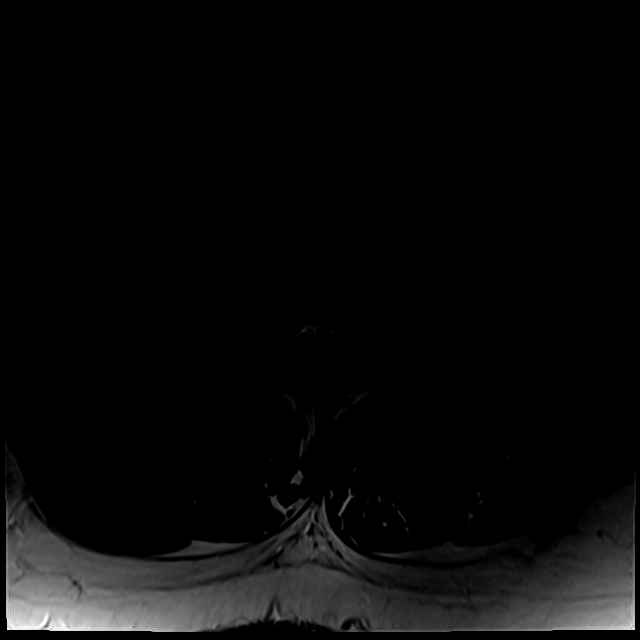
[im 35/42]
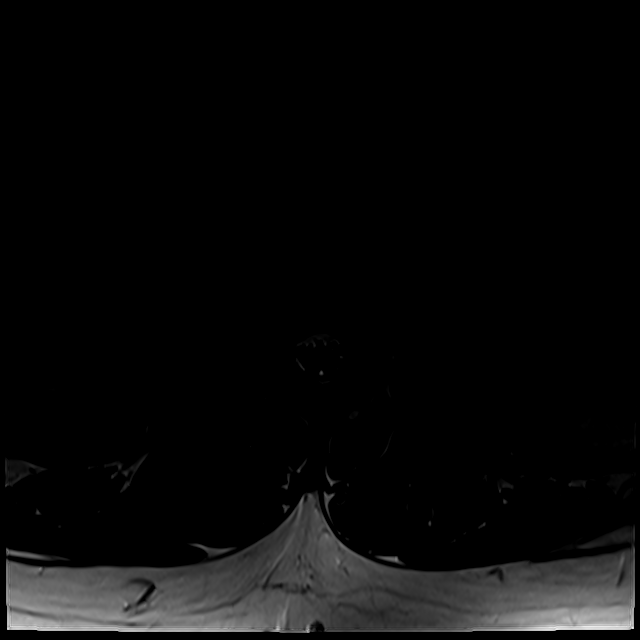
[im 42/42]
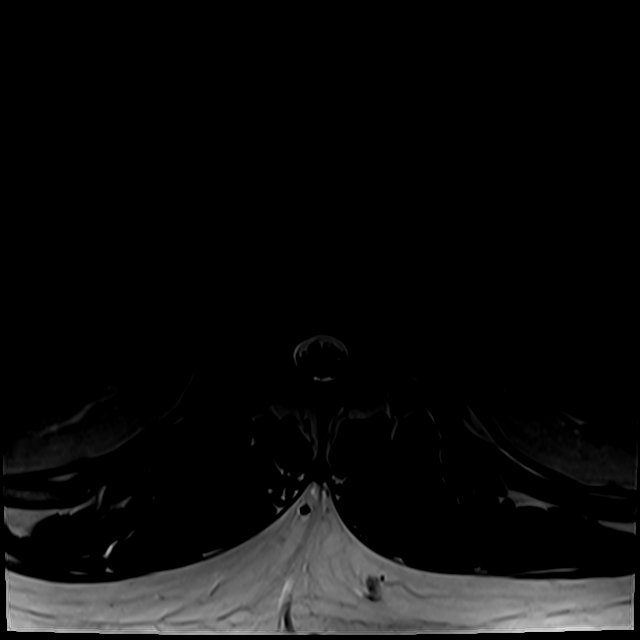

[Series 16: T2 · coronal · 5.0mm · 0.73mm/px · 6 of 19 slices shown (3 of 3)]
[im 1/19]
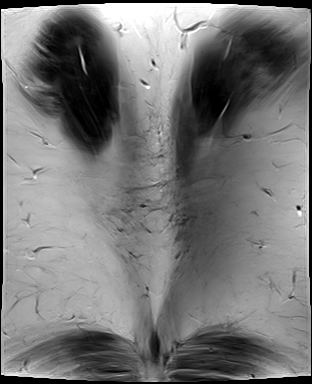
[im 4/19]
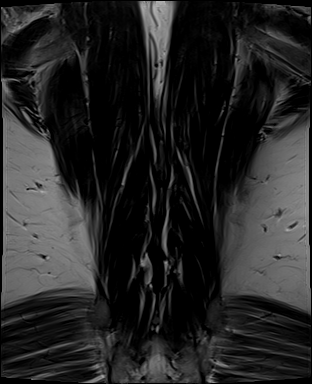
[im 8/19]
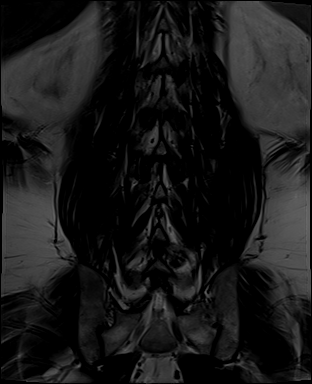
[im 11/19]
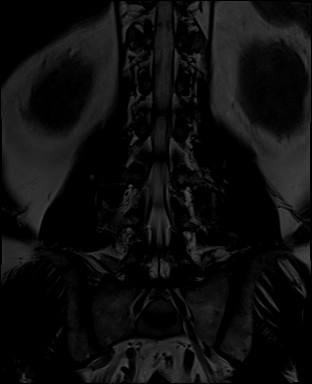
[im 15/19]
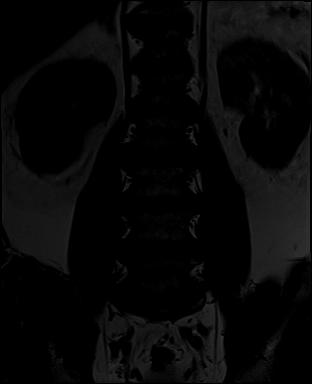
[im 19/19]
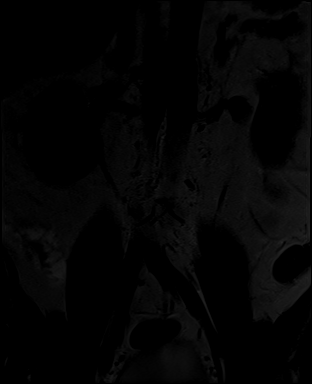

[21 of 48 positions shown; findings below may reference images not displayed]

FINDINGS: Segmentation:  Standard.

Alignment:  2 mm retrolisthesis of L4 on L5 and L5-S1.

Vertebrae: No acute fracture, evidence of discitis, or aggressive
bone lesion. Incidental note made of a limbus vertebrae along the
superior anterior corner of the L4 vertebral body.

Conus medullaris and cauda equina: Conus extends to the L1 level.
Conus and cauda equina appear normal.

Paraspinal and other soft tissues: No acute paraspinal abnormality.

Disc levels:

Disc spaces: Disc desiccation with disc height loss at L1-2 and
L5-S1. Disc desiccation throughout the remainder of the lumbar
spine.

T12-L1: No significant disc bulge. No neural foraminal stenosis. No
central canal stenosis.

L1-L2: Mild broad-based disc bulge. No foraminal or central canal
stenosis. Mild bilateral facet arthropathy.

L2-L3: Mild broad-based disc bulge. Moderate bilateral facet
arthropathy with small bilateral facet effusions. No foraminal
stenosis. Mild spinal stenosis.

L3-L4: Mild broad-based disc bulge. Mild bilateral facet
arthropathy. No foraminal or central canal stenosis.

L4-L5: Mild broad-based disc bulge flattening the ventral thecal
sac. Moderate bilateral facet arthropathy. Mild spinal stenosis.
Mild right foraminal stenosis. No left foraminal stenosis.

L5-S1: Broad-based disc osteophyte complex. Moderate bilateral facet
arthropathy. Prominence of the epidural fat deforming the thecal sac
as can be seen with epidural lipomatosis. No spinal stenosis. Severe
bilateral foraminal stenosis.
IMPRESSION: 1. At L5-S1 there is a broad-based disc osteophyte complex. Moderate
bilateral facet arthropathy. Prominence of the epidural fat
deforming the thecal sac as can be seen with epidural lipomatosis.
No spinal stenosis. Severe bilateral foraminal stenosis.
2. At L4-5 there is a mild broad-based disc bulge flattening the
ventral thecal sac. Moderate bilateral facet arthropathy. Mild
spinal stenosis. Mild right foraminal stenosis.
3. Mild spondylosis throughout the remainder of the lumbar spine as
described above.

## 2024-05-18 ENCOUNTER — Ambulatory Visit (INDEPENDENT_AMBULATORY_CARE_PROVIDER_SITE_OTHER): Admitting: Internal Medicine

## 2024-05-18 ENCOUNTER — Other Ambulatory Visit: Payer: Self-pay

## 2024-05-18 ENCOUNTER — Ambulatory Visit: Admitting: Internal Medicine

## 2024-05-18 ENCOUNTER — Encounter: Payer: Self-pay | Admitting: Internal Medicine

## 2024-05-18 VITALS — BP 120/70 | HR 71 | Temp 99.1°F | Ht 70.0 in | Wt 245.4 lb

## 2024-05-18 DIAGNOSIS — J449 Chronic obstructive pulmonary disease, unspecified: Secondary | ICD-10-CM

## 2024-05-18 DIAGNOSIS — G4733 Obstructive sleep apnea (adult) (pediatric): Secondary | ICD-10-CM | POA: Diagnosis not present

## 2024-05-18 DIAGNOSIS — Z87891 Personal history of nicotine dependence: Secondary | ICD-10-CM

## 2024-05-18 DIAGNOSIS — Z122 Encounter for screening for malignant neoplasm of respiratory organs: Secondary | ICD-10-CM

## 2024-05-18 DIAGNOSIS — R0609 Other forms of dyspnea: Secondary | ICD-10-CM | POA: Diagnosis not present

## 2024-05-18 LAB — PULMONARY FUNCTION TEST
DL/VA % pred: 119 %
DL/VA: 5.1 ml/min/mmHg/L
DLCO unc % pred: 101 %
DLCO unc: 28.54 ml/min/mmHg
FEF 25-75 Post: 3.34 L/s
FEF 25-75 Pre: 3.72 L/s
FEF2575-%Change-Post: -10 %
FEF2575-%Pred-Post: 106 %
FEF2575-%Pred-Pre: 119 %
FEV1-%Change-Post: -1 %
FEV1-%Pred-Post: 89 %
FEV1-%Pred-Pre: 91 %
FEV1-Post: 3.32 L
FEV1-Pre: 3.38 L
FEV1FVC-%Change-Post: 2 %
FEV1FVC-%Pred-Pre: 108 %
FEV6-%Change-Post: -3 %
FEV6-%Pred-Post: 83 %
FEV6-%Pred-Pre: 87 %
FEV6-Post: 3.9 L
FEV6-Pre: 4.07 L
FEV6FVC-%Change-Post: 0 %
FEV6FVC-%Pred-Post: 104 %
FEV6FVC-%Pred-Pre: 104 %
FVC-%Change-Post: -4 %
FVC-%Pred-Post: 80 %
FVC-%Pred-Pre: 83 %
FVC-Post: 3.9 L
FVC-Pre: 4.07 L
Post FEV1/FVC ratio: 85 %
Post FEV6/FVC ratio: 100 %
Pre FEV1/FVC ratio: 83 %
Pre FEV6/FVC Ratio: 100 %
RV % pred: 88 %
RV: 1.93 L
TLC % pred: 84 %
TLC: 5.88 L

## 2024-05-18 LAB — NITRIC OXIDE: Nitric Oxide: 14

## 2024-05-18 NOTE — Patient Instructions (Signed)
 Full PFT completed today ? ?

## 2024-05-18 NOTE — Progress Notes (Signed)
 Full PFT completed today ? ?

## 2024-05-18 NOTE — Progress Notes (Signed)
 [facility] [place] Pulmonary Medicine Consultation     Brief Summary:  Kevin Huerta is a 58 y.o. male with ASTHMA, Obesity and sleep apnea.     CC Follow up assessment of ASTHMA Follow up assessment of OSA   Subjective:   Assessment of asthma Patient has a history of childhood asthma Triggers include bleach and cold air Patient has progressive shortness of breath over the last several years   Assessment of ASTHMA FeNO  14  ppb-Elevated exhaled Nitric oxide  testing is NOT consistent with type II inflammation Continue with Trelegy as prescribed This has significantly helped her symptoms No longer using rescue inhaler  No exacerbation at this time No evidence of heart failure at this time No evidence or signs of infection at this time No respiratory distress No fevers, chills, nausea, vomiting, diarrhea No evidence of lower extremity edema No evidence hemoptysis     Assessment OSA Patient with mild to moderate OSA Significant hypoxia Started CPAP-patient adapting well Patient went on vacation did not use CPAP I have advised to obtain a traveling CPAP Apart from that patient has very good compliance HI reduced to 2.2   Former smoker quit 6 years ago Previous history of COVID 2021 Pneumonia several years ago referral to the lung cancer screening program    Physical Exam:   BP 120/70 (BP Location: Left Arm, Patient Position: Sitting, Cuff Size: Large)   Pulse 71   Temp 99.1 F (37.3 C) (Oral)   Ht 5' 10 (1.778 m)   Wt 245 lb 6.4 oz (111.3 kg)   SpO2 97%   BMI 35.21 kg/m    Review of Systems: Gen:  Denies  fever, sweats, chills weight loss  HEENT: Denies blurred vision, double vision, ear pain, eye pain, hearing loss, nose bleeds, sore throat Cardiac:  No dizziness, chest pain or heaviness, chest tightness,edema, No JVD Resp:   No cough, -sputum production, -shortness of breath,-wheezing, -hemoptysis,  Other:  All other systems  negative   Physical Examination:   General Appearance: No distress  EYES PERRLA, EOM intact.   NECK Supple, No JVD Pulmonary: normal breath sounds, No wheezing.  CardiovascularNormal S1,S2.  No m/r/g.   Abdomen: Benign, Soft, non-tender. Neurology UE/LE 5/5 strength, no focal deficits Ext pulses intact, cap refill intact ALL OTHER ROS ARE NEGATIVE       Pulmonary testing:  FeNO 02/04/23 >> 62 FeNO 05/18/2024>>   Chest Imaging:  LDCT chest 02/02/22 >> coronary calcification, mild centrilobular emphysema, b/l calcified granulomas  Sleep Tests:  HST 08/22/20 >> AHI 7.9, SpO2 low 82% HST August 2024 AHI 4% 8.2 AHI 3% 17.6, less than 88% oxygen/for 15 minutes average apnea duration is 27 seconds  Cardiac Tests:    Social History:  He  reports that he quit smoking about 5 years ago. His smoking use included cigarettes and e-cigarettes. He started smoking about 37 years ago. He has a 48 pack-year smoking history. He quit smokeless tobacco use about 29 years ago.  His smokeless tobacco use included snuff. He reports that he does not currently use alcohol. He reports that he does not currently use drugs after having used the following drugs: Marijuana.  Family History:  His family history includes Alcohol abuse in his father; Drug abuse in his father; Heart attack (age of onset: 45) in his father; Heart attack (age of onset: 42) in his maternal grandmother; Heart disease in his father; Hypertension in his mother; Lung cancer in his father; Stroke (age of onset:  20) in his maternal aunt.     Assessment/Plan:  22 year old morbidly obese African-American male with underlying diagnosis of asthma COPD with underlying sleep apnea in the setting of morbid obesity and deconditioned state  Shortness of breath asthma Pulmonary function test reviewed in detail with patient Post bronchodilator FEV1 FVC 85% predicted FEV1 89% predicted No significant bronchodilator response Total lung  capacity is 84% predicted DLCO was 101% predicted Flow-volume loops does not show any restrictive or obstructive pattern Overall clinical interpretation normal pulmonary function testing Findings reviewed in detail with patient today 05/18/2024   Dyspnea on exertion related to history of asthma and with tobacco abuse COPD with history of asthma and tobacco abuse, CT imaging showing very mild  emphysema and coronary calcification, and family history of obstructive lung disease Continue Trelegy as prescribed Decreased albuterol  use Rinse mouth after use Avoid Allergens and Irritants Avoid secondhand smoke Avoid SICK contacts Recommend  Masking  when appropriate Recommend Keep up-to-date with vaccinations    Former smoker Reestablish lung cancer screening program    Assessment of OSA Mask fitting assessment in [place] Continue AUTO CPAP 4-12 cm h20 Patient Instructions  Continue to use CPAP every night, minimum of 4-6 hours a night.  Change equipment every 30 days or as directed by DME.  Wash your tubing with warm soap and water daily, hang to dry. Wash humidifier portion weekly. Use bottled, distilled water and change daily   Be aware of reduced alertness and do not drive or operate heavy machinery if experiencing this or drowsiness.  Exercise encouraged, as tolerated. Encouraged proper weight management.  Important to get eight or more hours of sleep  Limiting the use of the computer and television before bedtime.  Decrease naps during the day, so night time sleep will become enhanced.  Limit caffeine, and sleep deprivation.  HTN, stroke, uncontrolled diabetes and heart failure are potential risk factors.  Risk of untreated sleep apnea including cardiac arrhthymias, stroke, DM, pulm HTN.    Recommend weight loss Current weight 248 pounds 5 feet 10 inches   Obesity -recommend significant weight loss -recommend changing diet  Deconditioned state -Recommend  increased daily activity and exercise  Medication List:   Allergies as of 05/18/2024       Reactions   Aspirin  Tinitus        Medication List        Accurate as of May 18, 2024  7:36 AM. If you have any questions, ask your nurse or doctor.          albuterol  108 (90 Base) MCG/ACT inhaler Commonly known as: VENTOLIN  HFA USE 2 INHALATIONS EVERY 6 HOURS AS NEEDED FOR WHEEZING OR SHORTNESS OF BREATH   allopurinol  100 MG tablet Commonly known as: ZYLOPRIM  Take 200 mg by mouth daily.   ALPRAZolam  0.5 MG tablet Commonly known as: XANAX  Take 0.5 mg by mouth as needed.   atorvastatin  80 MG tablet Commonly known as: LIPITOR TAKE 1 TABLET BY MOUTH EVERY DAY   cetirizine 10 MG tablet Commonly known as: ZYRTEC Take 10 mg by mouth 2 (two) times daily.   clopidogrel  75 MG tablet Commonly known as: Plavix  Take 1 tablet (75 mg total) by mouth daily.   clotrimazole -betamethasone  cream Commonly known as: LOTRISONE  APPLY 1 APPLICATION TOPICALLY DAILY   colchicine 0.6 MG tablet Take 0.6 mg by mouth 2 (two) times daily as needed.   cyclobenzaprine  5 MG tablet Commonly known as: FLEXERIL  Take 1 tablet (5 mg total) by mouth 3 (three)  times daily as needed for muscle spasms.   fluticasone 50 MCG/ACT nasal spray Commonly known as: FLONASE Place 2 sprays into both nostrils daily. What changed:  when to take this reasons to take this   FreeStyle Libre 2 Sensor Misc USE TO CHECK GLUCOSE THREE TIMES A DAY   glipiZIDE 10 MG 24 hr tablet Commonly known as: GLUCOTROL XL Take 10 mg by mouth.   ipratropium 0.06 % nasal spray Commonly known as: ATROVENT Place 2 sprays into both nostrils 4 (four) times daily.   ketoconazole 2 % cream Commonly known as: NIZORAL Apply to both feet and between toes once daily for 6 weeks.   Melatonin 10 MG Caps Take 10 mg by mouth every evening.   Multi-Vitamin tablet Take 1 tablet by mouth daily.   ondansetron 4 MG disintegrating  tablet Commonly known as: ZOFRAN-ODT Take 1 tablet (4 mg total) by mouth every 8 (eight) hours as needed for nausea or vomiting.   OneTouch Delica Lancets 33G Misc by Does not apply route.   OneTouch Verio Flex System w/Device Kit by Does not apply route.   OneTouch Verio test strip Generic drug: glucose blood   OVER THE COUNTER MEDICATION Take 600 mg by mouth daily. [place] Ashwagandha Gummies   Ozempic (2 MG/DOSE) 8 MG/3ML Sopn Generic drug: Semaglutide (2 MG/DOSE) Inject 2 mg into the skin once a week.   pantoprazole 40 MG tablet Commonly known as: Protonix Take 1 tablet (40 mg total) by mouth daily.   pioglitazone 30 MG tablet Commonly known as: ACTOS Take 30 mg by mouth daily.   PROBIOTIC DAILY PO Take 1 capsule by mouth daily.   Synjardy XR 12.02-999 MG Tb24 Generic drug: Empagliflozin-metFORMIN HCl ER Take 2 tablets by mouth daily before breakfast.   traZODone 100 MG tablet Commonly known as: DESYREL Take 100 mg by mouth at bedtime.   Trelegy Ellipta 100-62.5-25 MCG/ACT Aepb Generic drug: Fluticasone-Umeclidin-Vilant Inhale 1 Act into the lungs daily.   valsartan 320 MG tablet Commonly known as: DIOVAN TAKE 1 TABLET BY MOUTH EVERY DAY         MEDICATION ADJUSTMENTS/LABS AND TESTS ORDERED: Continue Trelegy as prescribed Rinse mouth after use Reestablish lung cancer screening program Avoid Allergens and Irritants Avoid secondhand smoke Avoid SICK contacts Recommend  Masking  when appropriate Recommend Keep up-to-date with vaccinations Continue AUTO CPAP 4-12 cm h20 Recommend mask fitting assessment services Recommend weight loss  CURRENT MEDICATIONS REVIEWED AT LENGTH WITH PATIENT TODAY   Patient  satisfied with Plan of action and management. All questions answered   Follow up 6 months   I spent a total of 53 minutes reviewing chart data, face-to-face evaluation with the patient, counseling and coordination of care as detailed above.       Nickolas Alm Cellar, M.D.  [facility]  Medical Director [facility] Medical Director [facility] Cardio-Pulmonary Department

## 2024-05-18 NOTE — Patient Instructions (Signed)
 Continue CPAP as prescribed Continue Trelegy as prescribed Please rinse mouth after use  Avoid Allergens and Irritants Avoid secondhand smoke Avoid SICK contacts Recommend  Masking  when appropriate Recommend Keep up-to-date with vaccinations  Recommend weight loss Referral to [facility] for mask fitting assessment referral to the lung cancer screening program

## 2024-05-22 ENCOUNTER — Ambulatory Visit: Admitting: Internal Medicine

## 2024-05-22 ENCOUNTER — Ambulatory Visit: Payer: Self-pay | Admitting: Internal Medicine

## 2024-05-22 ENCOUNTER — Encounter: Payer: Self-pay | Admitting: Internal Medicine

## 2024-05-22 VITALS — BP 136/74 | HR 81 | Temp 98.2°F | Ht 70.0 in | Wt 244.0 lb

## 2024-05-22 DIAGNOSIS — I152 Hypertension secondary to endocrine disorders: Secondary | ICD-10-CM

## 2024-05-22 DIAGNOSIS — E1159 Type 2 diabetes mellitus with other circulatory complications: Secondary | ICD-10-CM | POA: Diagnosis not present

## 2024-05-22 DIAGNOSIS — E785 Hyperlipidemia, unspecified: Secondary | ICD-10-CM

## 2024-05-22 DIAGNOSIS — I70229 Atherosclerosis of native arteries of extremities with rest pain, unspecified extremity: Secondary | ICD-10-CM

## 2024-05-22 DIAGNOSIS — Z6835 Body mass index (BMI) 35.0-35.9, adult: Secondary | ICD-10-CM

## 2024-05-22 DIAGNOSIS — E1169 Type 2 diabetes mellitus with other specified complication: Secondary | ICD-10-CM

## 2024-05-22 DIAGNOSIS — Z7985 Long-term (current) use of injectable non-insulin antidiabetic drugs: Secondary | ICD-10-CM

## 2024-05-22 DIAGNOSIS — Z7984 Long term (current) use of oral hypoglycemic drugs: Secondary | ICD-10-CM

## 2024-05-22 DIAGNOSIS — G4733 Obstructive sleep apnea (adult) (pediatric): Secondary | ICD-10-CM

## 2024-05-22 LAB — POCT GLYCOSYLATED HEMOGLOBIN (HGB A1C): Hemoglobin A1C: 7.4 % — AB (ref 4.0–5.6)

## 2024-05-22 LAB — MICROALBUMIN / CREATININE URINE RATIO
Creatinine,U: 59.7 mg/dL
Microalb Creat Ratio: UNDETERMINED mg/g (ref 0.0–30.0)
Microalb, Ur: <0.7 mg/dL

## 2024-05-22 MED ORDER — VALSARTAN 320 MG PO TABS: 320.0000 mg | ORAL_TABLET | Freq: Every day | ORAL | 3 refills | Status: AC

## 2024-05-22 NOTE — Assessment & Plan Note (Signed)
-   This problem is chronic and stable -Patient has not had a lipid panel in over a year -Will recheck lipid panel today -Continue with Lipitor 80 mg daily -No further workup at this time

## 2024-05-22 NOTE — Assessment & Plan Note (Signed)
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

## 2024-05-22 NOTE — Progress Notes (Addendum)
 Acute Office Visit  Subjective:     Patient ID: Kevin Huerta, male    DOB: [birth year], 58 y.o.   MRN: [id number]  Chief Complaint  Patient presents with   Medication Refill    Medication Refill   Patient is in today for follow-up of his diabetes and hypertension.  For his diabetes, patient states that he does have an endocrinologist and has an appointment tomorrow.  He has been compliant with his medications including glipizide , Actos , semaglutide 2 mg weekly as well as Synjardy  XR 12.02/999 mg 2 tabs daily.  His A1c has improved to 7.4 today  Patient states that he ran out of his valsartan  approximately 2 weeks ago.  His blood pressure today is mildly elevated at 136/74.  He needs a refill of his valsartan  today.  Patient does have history of hyperlipidemia and is on a high intensity statin.  He has not had his lipid panel in over a year.  Patient does have a history of OSA and has been having issues with the fit of his mask over his face.  He has an appointment to see someone for a mask fitting.  Does have a history of peripheral arterial disease and follows up with Dr. Marea for this.  He is status post left femoral endarterectomy with SFA stent placement.  He is compliant with his Plavix  for this  Review of Systems  Constitutional: Negative.   HENT: Negative.    Respiratory: Negative.    Cardiovascular: Negative.   Gastrointestinal: Negative.   Musculoskeletal: Negative.   Neurological: Negative.   Psychiatric/Behavioral: Negative.          Objective:    BP 136/74   Pulse 81   Temp 98.2 F (36.8 C)   Ht 5' 10 (1.778 m)   Wt 244 lb (110.7 kg)   SpO2 97%   BMI 35.01 kg/m    Physical Exam Constitutional:      Appearance: Normal appearance.  HENT:     Head: Normocephalic and atraumatic.     Mouth/Throat:     Mouth: Mucous membranes are moist.     Pharynx: No oropharyngeal exudate.  Cardiovascular:     Rate and Rhythm: Normal rate and regular rhythm.      Heart sounds: Normal heart sounds.  Pulmonary:     Effort: Pulmonary effort is normal.     Breath sounds: Normal breath sounds. No wheezing, rhonchi or rales.  Abdominal:     General: Bowel sounds are normal. There is no distension.     Palpations: Abdomen is soft.     Tenderness: There is no abdominal tenderness. There is no guarding or rebound.  Musculoskeletal:        General: No swelling or tenderness.     Cervical back: Neck supple.     Right lower leg: No edema.     Left lower leg: No edema.  Lymphadenopathy:     Cervical: No cervical adenopathy.  Neurological:     Mental Status: He is alert and oriented to person, place, and time.  Psychiatric:        Mood and Affect: Mood normal.        Behavior: Behavior normal.     Results for orders placed or performed in visit on 05/22/24  POCT glycosylated hemoglobin (Hb A1C)  Result Value Ref Range   Hemoglobin A1C 7.4 (A) 4.0 - 5.6 %   HbA1c POC (<> result, manual entry)     HbA1c, POC (prediabetic range)  HbA1c, POC (controlled diabetic range)          Assessment & Plan:   Problem List Items Addressed This Visit       Cardiovascular and Mediastinum   Atherosclerosis of artery of extremity with rest pain (HCC)   - This problem is chronic and stable -Patient follows up with Dr. Marea from vascular surgery for this -He s/p left femoral endarterectomy with left SFA stent placement -He had recent ABIs which was 0.9 on the right and 1.25 on the left -Continue with atorvastatin  80 mg daily as well as Plavix  75 mg daily -No further workup at this time      Relevant Medications   valsartan  (DIOVAN ) 320 MG tablet   Hypertension associated with diabetes (HCC)   - This problem is chronic and stable -Patient ran out of his valsartan  2 weeks ago.  He is here for refill of this medication today -Blood pressure today is 136/74.  Suspect it is slightly elevated secondary to being out of his valsartan  -Will refill his valsartan   today -No further workup at this time      Relevant Medications   valsartan  (DIOVAN ) 320 MG tablet     Respiratory   OSA (obstructive sleep apnea)   - This problem is chronic and stable -Patient recently follow-up with pulmonology for this -He has not had any issues with the fit of the mask over his face and will follow-up with someone to help with the mask fitting -Patient encouraged to use his CPAP as much as possible.  He states that he uses it every night but is unable to tolerate it for the entire night secondary to the fit of the mask -Will continue to follow        Endocrine   Hyperlipidemia associated with type 2 diabetes mellitus (HCC) - Primary   - This problem is chronic and stable -Patient has not had a lipid panel in over a year -Will recheck lipid panel today -Continue with Lipitor 80 mg daily -No further workup at this time      Relevant Medications   valsartan  (DIOVAN ) 320 MG tablet   Other Relevant Orders   Lipid panel   Type 2 diabetes mellitus with other specified complication (HCC)   - This problem is chronic and stable -Patient's A1c today 7.4 down from 7.9 in April -He follows up with endocrinology for this and has an appointment to see them tomorrow -Will continue with current medications including Actos  30 mg daily, glipizide  10 mg daily, Ozempic 2 mg weekly as well as Synjardy  XR 12.02/999 mg 2 tabs daily -Will check a UACR today as well as a lipid panel -No further workup at this time      Relevant Medications   valsartan  (DIOVAN ) 320 MG tablet   Other Relevant Orders   POCT glycosylated hemoglobin (Hb A1C) (Completed)   Microalbumin / creatinine urine ratio     Other   Morbid obesity (HCC)   - Patient has a BMI greater than 35 associated with hypertension and diabetes -He is on semaglutide 2 mg weekly -He is able to eat a healthy diet with 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    Dispense:  90 tablet    Refill:  3    DX Code Needed  .    No follow-ups on file.  Ed Mandich, MD

## 2024-05-22 NOTE — Assessment & Plan Note (Signed)
-   This problem is chronic and stable -Patient follows up with Dr. Marea from vascular surgery for this -He s/p left femoral endarterectomy with left SFA stent placement -He had recent ABIs which was 0.9 on the right and 1.25 on the left -Continue with atorvastatin  80 mg daily as well as Plavix  75 mg daily -No further workup at this time

## 2024-05-22 NOTE — Assessment & Plan Note (Signed)
-   This problem is chronic and stable -Patient's A1c today 7.4 down from 7.9 in April -He follows up with endocrinology for this and has an appointment to see them tomorrow -Will continue with current medications including Actos  30 mg daily, glipizide  10 mg daily, Ozempic 2 mg weekly as well as Synjardy  XR 12.02/999 mg 2 tabs daily -Will check a UACR today as well as a lipid panel -No further workup at this time

## 2024-05-22 NOTE — Patient Instructions (Addendum)
-   It was a pleasure meeting you today -We will refill your valsartan  for you.  Please let us  know if you need any other refills on other medication -Please follow-up with your endocrinologist tomorrow.  Your A1c is improving and is down to 7.4 today. -We will check some blood work on you today including your cholesterol levels.  You had your kidney function, liver function and blood counts done recently I do not need to repeat this at this time. -We will check a urine test today to assess for protein in the urine. -Please contact us  with any questions or concerns or if you need any refills

## 2024-05-22 NOTE — Assessment & Plan Note (Signed)
-   This problem is chronic and stable -Patient recently follow-up with pulmonology for this -He has not had any issues with the fit of the mask over his face and will follow-up with someone to help with the mask fitting -Patient encouraged to use his CPAP as much as possible.  He states that he uses it every night but is unable to tolerate it for the entire night secondary to the fit of the mask -Will continue to follow

## 2024-05-22 NOTE — Assessment & Plan Note (Signed)
-   This problem is chronic and stable -Patient ran out of his valsartan  2 weeks ago.  He is here for refill of this medication today -Blood pressure today is 136/74.  Suspect it is slightly elevated secondary to being out of his valsartan  -Will refill his valsartan  today -No further workup at this time

## 2024-05-23 ENCOUNTER — Other Ambulatory Visit

## 2024-05-26 ENCOUNTER — Other Ambulatory Visit

## 2024-05-26 DIAGNOSIS — E1169 Type 2 diabetes mellitus with other specified complication: Secondary | ICD-10-CM

## 2024-05-26 DIAGNOSIS — E785 Hyperlipidemia, unspecified: Secondary | ICD-10-CM

## 2024-05-26 LAB — LIPID PANEL
Cholesterol: 133 mg/dL (ref 0–200)
HDL: 50.1 mg/dL (ref >39.00)
LDL Cholesterol: 71 mg/dL (ref 0–99)
NonHDL: 82.68
Total CHOL/HDL Ratio: 3
Triglycerides: 57 mg/dL (ref 0.0–149.0)
VLDL: 11.4 mg/dL (ref 0.0–40.0)

## 2024-05-31 ENCOUNTER — Other Ambulatory Visit (HOSPITAL_BASED_OUTPATIENT_CLINIC_OR_DEPARTMENT_OTHER)

## 2024-06-06 ENCOUNTER — Ambulatory Visit: Payer: Self-pay

## 2024-06-12 ENCOUNTER — Ambulatory Visit: Admitting: Podiatry

## 2024-06-12 ENCOUNTER — Encounter: Payer: Self-pay | Admitting: Podiatry

## 2024-06-12 DIAGNOSIS — B353 Tinea pedis: Secondary | ICD-10-CM

## 2024-06-12 DIAGNOSIS — M79675 Pain in left toe(s): Secondary | ICD-10-CM | POA: Diagnosis not present

## 2024-06-12 DIAGNOSIS — M2011 Hallux valgus (acquired), right foot: Secondary | ICD-10-CM | POA: Diagnosis not present

## 2024-06-12 DIAGNOSIS — E119 Type 2 diabetes mellitus without complications: Secondary | ICD-10-CM

## 2024-06-12 DIAGNOSIS — M2012 Hallux valgus (acquired), left foot: Secondary | ICD-10-CM

## 2024-06-12 DIAGNOSIS — L84 Corns and callosities: Secondary | ICD-10-CM

## 2024-06-12 DIAGNOSIS — B351 Tinea unguium: Secondary | ICD-10-CM | POA: Diagnosis not present

## 2024-06-12 DIAGNOSIS — E1151 Type 2 diabetes mellitus with diabetic peripheral angiopathy without gangrene: Secondary | ICD-10-CM | POA: Diagnosis not present

## 2024-06-12 DIAGNOSIS — M79674 Pain in right toe(s): Secondary | ICD-10-CM | POA: Diagnosis not present

## 2024-06-12 NOTE — Progress Notes (Signed)
 Subjective:  Patient ID: Kevin Huerta, male    DOB: [birth year],  MRN: [id number]  Kevin Huerta presents to clinic today for for annual diabetic foot examination and callus(es) right great toe and painful thick toenails that are difficult to trim. Painful toenails interfere with ambulation. Aggravating factors include wearing enclosed shoe gear. Pain is relieved with periodic professional debridement. Painful calluses are aggravated when weightbearing with and without shoegear. Pain is relieved with periodic professional debridement.   He is followed by [facility] for his PAD. Has h/o revascularization.  He states he only used his Ketoconazole  Cream for 4 weeks. Also has Bag Balm which he has not started using for dry skin.  Chief Complaint  Patient presents with   Nail Problem    Diabetic foot care   New problem(s): None.  . Narendra, Hans, MD. Kevin Huerta 05/22/2024.  Allergies  Allergen Reactions   Aspirin  Tinitus    Review of Systems: Negative except as noted in the HPI.  Objective: No changes noted in today's physical examination. There were no vitals filed for this visit. Kevin Huerta is a pleasant 58 y.o. male obese in NAD. AAO x 3.   Diabetic foot exam was performed with the following findings:   Normal sensation of 10g monofilament Vascular Examination: Capillary refill time immediate b/l. Vascular status intact b/l with palpable pedal pulses. Pedal hair present b/l. No pain with calf compression b/l. Skin temperature gradient WNL b/l. No cyanosis or clubbing b/l. No ischemia or gangrene noted b/l. No edema noted b/l LE.  Neurological Examination: Sensation grossly intact b/l with 10 gram monofilament. Vibratory sensation intact b/l.   Dermatological Examination: Pedal skin with normal turgor, texture and tone b/l.  No open wounds. No interdigital macerations.   Residual scaling noted plantar and peripheral aspect of both feet.  Toenails 1-5 b/l thick,  discolored, elongated with subungual debris and pain on dorsal palpation.   Hyperkeratotic lesion(s) medial IPJ of right great toe.  No erythema, no edema, no drainage, no fluctuance.  Musculoskeletal Examination: Muscle strength 5/5 to all lower extremity muscle groups bilaterally. HAV with bunion deformity noted b/l LE. Pes planus deformity noted bilateral LE.  Radiographs: None     Assessment/Plan: 1. Pain due to onychomycosis of toenails of both feet [B35.1, M79.675, M79.674]   2. Callus   3. Tinea pedis of both feet   4. Hallux valgus, acquired, bilateral   5. Type II diabetes mellitus with peripheral circulatory disorder (HCC)   6. Encounter for diabetic foot exam (HCC)     Diabetic foot examination performed. All patient's and/or POA's questions/concerns addressed on today's visit. Mycotic toenails 1-5 debrided in length and girth without incident. Callus(es) medial IPJ of right great toe pared with sharp debridement without incident. For tinea, resume Ketoconazole  Cream once daily for 2 weeks. Start Bag Balm once daily for moisturizing. Continue daily foot inspections and monitor blood glucose per PCP/Endocrinologist's recommendations. Continue soft, supportive shoe gear daily. Report any pedal injuries to medical professional. Call office if there are any questions/concerns. Return in about 3 months (around 09/11/2024).  Delon LITTIE Merlin, DPM      [place] LOCATION: [address], [place] [address]  Office [phone]   [facility] LOCATION: [address] [place], [place] [address] Office [phone]

## 2024-06-23 ENCOUNTER — Ambulatory Visit
Admission: RE | Admit: 2024-06-23 | Discharge: 2024-06-23 | Disposition: A | Discharge status: Home / Self Care | Source: Ambulatory Visit | Attending: Acute Care | Admitting: Acute Care

## 2024-06-23 DIAGNOSIS — Z122 Encounter for screening for malignant neoplasm of respiratory organs: Secondary | ICD-10-CM | POA: Diagnosis present

## 2024-06-23 DIAGNOSIS — Z87891 Personal history of nicotine dependence: Secondary | ICD-10-CM | POA: Diagnosis present

## 2024-06-27 ENCOUNTER — Encounter (INDEPENDENT_AMBULATORY_CARE_PROVIDER_SITE_OTHER): Payer: Self-pay | Admitting: Vascular Surgery

## 2024-06-28 NOTE — Telephone Encounter (Signed)
 Hi Kevin Huerta,  You are far enough away from your surgery to wear this is acceptable.  Good luck on your procedure.

## 2024-06-30 ENCOUNTER — Other Ambulatory Visit: Payer: Self-pay

## 2024-06-30 DIAGNOSIS — Z122 Encounter for screening for malignant neoplasm of respiratory organs: Secondary | ICD-10-CM

## 2024-06-30 DIAGNOSIS — Z87891 Personal history of nicotine dependence: Secondary | ICD-10-CM

## 2024-07-10 ENCOUNTER — Ambulatory Visit (HOSPITAL_BASED_OUTPATIENT_CLINIC_OR_DEPARTMENT_OTHER): Attending: Internal Medicine | Admitting: Radiology

## 2024-07-10 DIAGNOSIS — Z87891 Personal history of nicotine dependence: Secondary | ICD-10-CM

## 2024-07-10 DIAGNOSIS — R0609 Other forms of dyspnea: Secondary | ICD-10-CM

## 2024-07-18 ENCOUNTER — Encounter (INDEPENDENT_AMBULATORY_CARE_PROVIDER_SITE_OTHER): Payer: Self-pay | Admitting: Vascular Surgery

## 2024-07-18 ENCOUNTER — Ambulatory Visit (INDEPENDENT_AMBULATORY_CARE_PROVIDER_SITE_OTHER)

## 2024-07-18 ENCOUNTER — Ambulatory Visit (INDEPENDENT_AMBULATORY_CARE_PROVIDER_SITE_OTHER): Admitting: Vascular Surgery

## 2024-07-18 VITALS — BP 138/89 | HR 78 | Resp 18 | Wt 240.6 lb

## 2024-07-18 DIAGNOSIS — E1159 Type 2 diabetes mellitus with other circulatory complications: Secondary | ICD-10-CM

## 2024-07-18 DIAGNOSIS — I70229 Atherosclerosis of native arteries of extremities with rest pain, unspecified extremity: Secondary | ICD-10-CM

## 2024-07-18 DIAGNOSIS — I152 Hypertension secondary to endocrine disorders: Secondary | ICD-10-CM

## 2024-07-18 DIAGNOSIS — E1169 Type 2 diabetes mellitus with other specified complication: Secondary | ICD-10-CM

## 2024-07-18 DIAGNOSIS — E785 Hyperlipidemia, unspecified: Secondary | ICD-10-CM

## 2024-07-18 LAB — VAS US ABI WITH/WO TBI
Left ABI: 1.22
Right ABI: 1.01

## 2024-07-18 NOTE — Assessment & Plan Note (Signed)
 lipid control important in reducing the progression of atherosclerotic disease. Continue statin therapy

## 2024-07-18 NOTE — Progress Notes (Signed)
 MRN : [id number]  Kevin Huerta is a 58 y.o. (1966-04-21) male who presents with chief complaint of  Chief Complaint  Patient presents with   Follow-up    6 month abi follow up  .  History of Present Illness: Patient returns today in follow up of his PAD.  About a year ago, he underwent left lower extremity femoral endarterectomy and endoluminal infrainguinal revascularization.  It was almost a year and 1/2 to 2 years ago when he underwent extensive right lower extremity endovascular revascularization.  He has some occasional numbness in the right leg but no further claudication, rest pain, or ulceration of the lower extremities.  His ABIs today are 1.01 on the right and 1.22 on the left with triphasic waveforms and normal digital pressures bilaterally.  Current Outpatient Medications  Medication Sig Dispense Refill   albuterol  (VENTOLIN  HFA) 108 (90 Base) MCG/ACT inhaler USE 2 INHALATIONS EVERY 6 HOURS AS NEEDED FOR WHEEZING OR SHORTNESS OF BREATH 8.5 g 10   allopurinol  (ZYLOPRIM ) 100 MG tablet Take 200 mg by mouth daily.     ALPRAZolam  (XANAX ) 0.5 MG tablet Take 0.5 mg by mouth as needed.     atorvastatin  (LIPITOR) 80 MG tablet TAKE 1 TABLET BY MOUTH EVERY DAY 90 tablet 3   Blood Glucose Monitoring Suppl (ONETOUCH VERIO FLEX SYSTEM) w/Device KIT by Does not apply route.     cetirizine (ZYRTEC) 10 MG tablet Take 10 mg by mouth 2 (two) times daily.     clopidogrel  (PLAVIX ) 75 MG tablet Take 1 tablet (75 mg total) by mouth daily. 90 tablet 4   clotrimazole -betamethasone  (LOTRISONE ) cream APPLY 1 APPLICATION TOPICALLY DAILY 30 g 0   colchicine 0.6 MG tablet Take 0.6 mg by mouth 2 (two) times daily as needed.     Continuous Blood Gluc Sensor (FREESTYLE LIBRE 2 SENSOR) MISC USE TO CHECK GLUCOSE THREE TIMES A DAY 2 each 12   cyclobenzaprine  (FLEXERIL ) 5 MG tablet Take 1 tablet (5 mg total) by mouth 3 (three) times daily as needed for muscle spasms. 30 tablet 0   fluticasone  (FLONASE ) 50  MCG/ACT nasal spray Place 2 sprays into both nostrils daily. (Patient taking differently: Place 2 sprays into both nostrils daily as needed.) 16 g 6   Fluticasone -Umeclidin-Vilant (TRELEGY ELLIPTA ) 100-62.5-25 MCG/ACT AEPB Inhale 1 Act into the lungs daily. 180 each 3   glipiZIDE  (GLUCOTROL  XL) 10 MG 24 hr tablet Take 10 mg by mouth.     ipratropium (ATROVENT ) 0.06 % nasal spray Place 2 sprays into both nostrils 4 (four) times daily. 15 mL 12   ketoconazole  (NIZORAL ) 2 % cream Apply to both feet and between toes once daily for 6 weeks. 60 g 1   Melatonin 10 MG CAPS Take 10 mg by mouth every evening.     Multiple Vitamin (MULTI-VITAMIN) tablet Take 1 tablet by mouth daily.     ondansetron  (ZOFRAN -ODT) 4 MG disintegrating tablet Take 1 tablet (4 mg total) by mouth every 8 (eight) hours as needed for nausea or vomiting. 20 tablet 0   OVER THE COUNTER MEDICATION Take 600 mg by mouth daily. Goli Ashwagandha Gummies     pantoprazole  (PROTONIX ) 40 MG tablet Take 1 tablet (40 mg total) by mouth daily. 90 tablet 3   pioglitazone  (ACTOS ) 30 MG tablet Take 30 mg by mouth daily.     Probiotic Product (PROBIOTIC DAILY PO) Take 1 capsule by mouth daily.     Semaglutide, 2 MG/DOSE, (OZEMPIC, 2 MG/DOSE,) 8 MG/3ML SOPN  Inject 2 mg into the skin once a week.     SYNJARDY  XR 12.02-999 MG TB24 Take 2 tablets by mouth daily before breakfast.     traZODone  (DESYREL ) 100 MG tablet Take 100 mg by mouth at bedtime.     valsartan  (DIOVAN ) 320 MG tablet Take 1 tablet (320 mg total) by mouth daily. 90 tablet 3   No current facility-administered medications for this visit.    Past Medical History:  Diagnosis Date   Allergic rhinitis, seasonal    Allergy August 2023   Aspirin    Anginal equivalent    Anxiety    a.) on BZO (alprazolam ) PRN   Aortic atherosclerosis    Arthritis    Asthma    Atherosclerosis of native artery of both lower extremities with intermittent claudication    a.) s/p PTA of RIGHT prox peroneal,  prox SFA, popliteal, and distal SFA; 6 mm x 15 cm Viabahn stent to RIGHT distal SFA and above knee popliteal artery; b.) s/p unsuccessful PTA of occluded SFA 03/22/2023 --> plans for endarterectomy +/-  endoluminal intervention   BRBPR (bright red blood per rectum) 05/24/2020   Chest heaviness 10/27/2022   Coronary artery disease of native artery of native heart with stable angina pectoris 03/29/2023   a.) cCTA 03/29/2023: Ca2+ 476 (95th percentile for age/sex/race matched control)   Current vaping on some days    DDD (degenerative disc disease), cervical    a.) s/p ACDF C3-C5 2014   Dermatitis 06/02/2021   Diarrhea 02/26/2021   Emphysema lung (HCC)    Fatty liver 09/03/2020   GERD (gastroesophageal reflux disease)    Gout    Heart murmur    History of cardiac catheterization 11/27/2008   a.) LHC 11/27/2008 at [facility] --> normal coronary anatomy with no obstructive CAD   Hyperlipidemia    Hypersomnia 07/17/2014   Hypertension    Insomnia    a.) uses melatonin +/- trazodone  PRN   Leg cramps 02/26/2021   Marijuana use, episodic    Occasional alcohol consumption    On long term clopidogrel  therapy    OSA (obstructive sleep apnea)    a.) not currently on nocturnal PAP therapy; needs repeat PSG   Seasonal allergies    Sleep apnea 2025   T2DM (type 2 diabetes mellitus) (HCC)    a.) monitors with Freestyle Libre CGM   Tinnitus     Past Surgical History:  Procedure Laterality Date   ANTERIOR CERVICAL DECOMP/DISCECTOMY FUSION N/A 2014   ENDARTERECTOMY FEMORAL Left 08/04/2023   Procedure: ENDARTERECTOMY FEMORAL (SFA STENT PLACEMENT);  Surgeon: Marea Selinda RAMAN, MD;  Location: [facility] ORS;  Service: Vascular;  Laterality: Left;   KNEE SURGERY Right    meniscus   LEFT HEART CATH AND CORONARY ANGIOGRAPHY Left 11/27/2008   Procedure: LEFT HEART CATH AND CORONARY ANGIOGRAPHY; Location[facility]; Surgeon: Donato Muir, MD   LOWER EXTREMITY ANGIOGRAPHY  Right 11/16/2022   Procedure: Lower Extremity Angiography;  Surgeon: Marea Selinda RAMAN, MD;  Location: [facility] INVASIVE CV LAB;  Service: Cardiovascular;  Laterality: Right;   LOWER EXTREMITY ANGIOGRAPHY Left 03/22/2023   Procedure: Lower Extremity Angiography;  Surgeon: Marea Selinda RAMAN, MD;  Location: [facility] INVASIVE CV LAB;  Service: Cardiovascular;  Laterality: Left;   ROTATOR CUFF REPAIR Right    WISDOM TOOTH EXTRACTION       Social History   Tobacco Use   Smoking status: Former    Current packs/day: 0.00    Average packs/day: 1.5  packs/day for 32.0 years (48.0 ttl pk-yrs)    Types: Cigarettes, E-cigarettes    Start date: 06/11/1986    Quit date: 06/11/2018    Years since quitting: 6.1   Smokeless tobacco: Former    Types: Snuff    Quit date: 1996   Tobacco comments:    I vape the marijuana from time to time, no tobacco products. - DJM 12/02/2023  Vaping Use   Vaping status: Some Days   Substances: Nicotine   Devices: x3 per day to quit smoking nicotine   Substance Use Topics   Alcohol use: Not Currently    Comment: cut back, 6 pack per month   Drug use: Not Currently    Types: Marijuana    Comment: a few times a year      Family History  Problem Relation Age of Onset   Heart attack Father 32   Alcohol abuse Father    Drug abuse Father    Heart disease Father    Lung cancer Father    Hypertension Mother    Heart attack Maternal Grandmother 66   Stroke Maternal Aunt 48     Allergies  Allergen Reactions   Aspirin  Tinitus     REVIEW OF SYSTEMS (Negative unless checked)  Constitutional: [] Weight loss  [] Fever  [] Chills Cardiac: [] Chest pain   [] Chest pressure   [] Palpitations   [] Shortness of breath when laying flat   [] Shortness of breath at rest   [] Shortness of breath with exertion. Vascular:  [] Pain in legs with walking   [] Pain in legs at rest   [] Pain in legs when laying flat   [] Claudication   [] Pain in feet when walking  [] Pain in feet at rest  [] Pain in feet when  laying flat   [] History of DVT   [] Phlebitis   [] Swelling in legs   [] Varicose veins   [] Non-healing ulcers Pulmonary:   [] Uses home oxygen   [] Productive cough   [] Hemoptysis   [] Wheeze  [] COPD   [] Asthma Neurologic:  [] Dizziness  [] Blackouts   [] Seizures   [] History of stroke   [] History of TIA  [] Aphasia   [] Temporary blindness   [] Dysphagia   [] Weakness or numbness in arms   [] Weakness or numbness in legs Musculoskeletal:  [] Arthritis   [] Joint swelling   [] Joint pain   [] Low back pain Hematologic:  [] Easy bruising  [] Easy bleeding   [] Hypercoagulable state   [] Anemic   Gastrointestinal:  [] Blood in stool   [] Vomiting blood  [] Gastroesophageal reflux/heartburn   [] Abdominal pain Genitourinary:  [] Chronic kidney disease   [] Difficult urination  [] Frequent urination  [] Burning with urination   [] Hematuria Skin:  [] Rashes   [] Ulcers   [] Wounds Psychological:  [] History of anxiety   []  History of major depression.  Physical Examination  BP 138/89   Pulse 78   Resp 18   Wt 240 lb 9.6 oz (109.1 kg)   BMI 34.52 kg/m  Gen:  WD/WN, NAD Head: [place]/AT, No temporalis wasting. Ear/Nose/Throat: Hearing grossly intact, nares w/o erythema or drainage Eyes: Conjunctiva clear. Sclera non-icteric Neck: Supple.  Trachea midline Pulmonary:  Good air movement, no use of accessory muscles.  Cardiac: RRR, no JVD Vascular:  Vessel Right Left  Radial Palpable Palpable                          PT Palpable Palpable  DP Palpable Palpable   Gastrointestinal: soft, non-tender/non-distended. No guarding/reflex.  Musculoskeletal: M/S 5/5 throughout.  No  deformity or atrophy. Trace LE edema. Neurologic: Sensation grossly intact in extremities.  Symmetrical.  Speech is fluent.  Psychiatric: Judgment intact, Mood & affect appropriate for pt's clinical situation. Dermatologic: No rashes or ulcers noted.  No cellulitis or open wounds.      Labs Recent Results (from the past 2160 hours)  Pulmonary  function test     Status: None   Collection Time: 05/18/24  8:19 AM  Result Value Ref Range   FVC-Pre 4.07 L   FVC-%Pred-Pre 83 %   FVC-Post 3.90 L   FVC-%Pred-Post 80 %   FVC-%Change-Post -4 %   FEV1-Pre 3.38 L   FEV1-%Pred-Pre 91 %   FEV1-Post 3.32 L   FEV1-%Pred-Post 89 %   FEV1-%Change-Post -1 %   FEV6-Pre 4.07 L   FEV6-%Pred-Pre 87 %   FEV6-Post 3.90 L   FEV6-%Pred-Post 83 %   FEV6-%Change-Post -3 %   Pre FEV1/FVC ratio 83 %   FEV1FVC-%Pred-Pre 108 %   Post FEV1/FVC ratio 85 %   FEV1FVC-%Change-Post 2 %   Pre FEV6/FVC Ratio 100 %   FEV6FVC-%Pred-Pre 104 %   Post FEV6/FVC ratio 100 %   FEV6FVC-%Pred-Post 104 %   FEV6FVC-%Change-Post 0 %   FEF 25-75 Pre 3.72 L/sec   FEF2575-%Pred-Pre 119 %   FEF 25-75 Post 3.34 L/sec   FEF2575-%Pred-Post 106 %   FEF2575-%Change-Post -10 %   RV 1.93 L   RV % pred 88 %   TLC 5.88 L   TLC % pred 84 %   DLCO unc 28.54 ml/min/mmHg   DLCO unc % pred 101 %   DL/VA 4.89 ml/min/mmHg/L   DL/VA % pred 880 %  Nitric oxide      Status: None   Collection Time: 05/18/24  9:27 AM  Result Value Ref Range   Nitric Oxide  14   POCT glycosylated hemoglobin (Hb A1C)     Status: Abnormal   Collection Time: 05/22/24  8:20 AM  Result Value Ref Range   Hemoglobin A1C 7.4 (A) 4.0 - 5.6 %   HbA1c POC (<> result, manual entry)     HbA1c, POC (prediabetic range)     HbA1c, POC (controlled diabetic range)    Microalbumin / creatinine urine ratio     Status: None   Collection Time: 05/22/24  8:34 AM  Result Value Ref Range   Microalb, Ur <0.7 mg/dL   Creatinine,U 40.2 mg/dL   Microalb Creat Ratio Unable to calculate 0.0 - 30.0 mg/g    Comment: Unable to Calculate due to Microalbumin Result of <0.7 mg/dL  Lipid panel     Status: None   Collection Time: 05/26/24  8:14 AM  Result Value Ref Range   Cholesterol 133 0 - 200 mg/dL    Comment: ATP III Classification       Desirable:  < 200 mg/dL               Borderline High:  200 - 239 mg/dL          High:   > = 759 mg/dL   Triglycerides 42.9 0.0 - 149.0 mg/dL    Comment: Normal:  <849 mg/dLBorderline High:  150 - 199 mg/dL   HDL 49.89 >60.99 mg/dL   VLDL 88.5 0.0 - 59.9 mg/dL   LDL Cholesterol 71 0 - 99 mg/dL   Total CHOL/HDL Ratio 3     Comment:                Men  Women1/2 Average Risk     3.4          3.3Average Risk          5.0          4.42X Average Risk          9.6          7.13X Average Risk          15.0          11.0                       NonHDL 82.68     Comment: NOTE:  Non-HDL goal should be 30 mg/dL higher than patient's LDL goal (i.e. LDL goal of < 70 mg/dL, would have non-HDL goal of < 100 mg/dL)  VAS US  ABI WITH/WO TBI     Status: None   Collection Time: 07/18/24  8:59 AM  Result Value Ref Range   Right ABI 1.01    Left ABI 1.22     Radiology VAS US  ABI WITH/WO TBI Result Date: 07/18/2024  LOWER EXTREMITY DOPPLER STUDY Patient Name:  Aithan Farrelly  Date of Exam:   07/18/2024 Medical Rec #: [id number]       Accession #:    [id number] Date of Birth: [birth year]       Patient Gender: M Patient Age:   40 years Exam Location:  [facility] Procedure:      VAS US  ABI WITH/WO TBI Referring Phys: Selinda Gu --------------------------------------------------------------------------------  Indications: Claudication, and peripheral artery disease. High Risk Factors: Hypertension, hyperlipidemia.  Vascular Interventions: 11/16/2022: Aortogram and Selective Right Lower                         Extremity Angiogram. PTA of the Right Proximal Peroneal                         Artery, Tibioperoneal trunk with 3 mm diameter                         angioplasty balloon. PTA of the Right CFA and Proximal                         SFA with 5 mm diameter Lutonix drug coated angioplasty                         balloon. PTA of the Right Popliteal Artery and Distal                         SFA with 4 mm diameter by 15 cm length Lutonix drug                         coated angioplasty balloon.  Viabahnstent placement to                         the Right most distal SFA and above knee Popliteal                         Artery with 6 mm diameter by 15 cm length Viabahn stent  for greater than 50% residual stenosis after                         angioplasty.                          03/22/2023: Aortogram and Selective Left Lower Extremity                         Angiogram. Comparison Study: 01/18/2024 Performing Technologist: Leafy Gibes RVS  Examination Guidelines: A complete evaluation includes at minimum, Doppler waveform signals and systolic blood pressure reading at the level of bilateral brachial, anterior tibial, and posterior tibial arteries, when vessel segments are accessible. Bilateral testing is considered an integral part of a complete examination. Photoelectric Plethysmograph (PPG) waveforms and toe systolic pressure readings are included as required and additional duplex testing as needed. Limited examinations for reoccurring indications may be performed as noted.  ABI Findings: +---------+------------------+-----+---------+--------+ Right    Rt Pressure (mmHg)IndexWaveform Comment  +---------+------------------+-----+---------+--------+ Brachial 148                                      +---------+------------------+-----+---------+--------+ ATA      149               1.01 triphasic         +---------+------------------+-----+---------+--------+ PTA      142               0.96 triphasic         +---------+------------------+-----+---------+--------+ Great Toe202               1.36 Normal            +---------+------------------+-----+---------+--------+ +---------+------------------+-----+---------+-------+ Left     Lt Pressure (mmHg)IndexWaveform Comment +---------+------------------+-----+---------+-------+ Brachial 145                                     +---------+------------------+-----+---------+-------+ ATA      181                1.22 triphasic        +---------+------------------+-----+---------+-------+ PTA      164               1.11 triphasic        +---------+------------------+-----+---------+-------+ [place] Toe199               1.34 Normal           +---------+------------------+-----+---------+-------+ +-------+-----------+-----------+------------+------------+ ABI/TBIToday's ABIToday's TBIPrevious ABIPrevious TBI +-------+-----------+-----------+------------+------------+ Right  1.01       1.36       .99         .97          +-------+-----------+-----------+------------+------------+ Left   1.22       1.34       1.25        1.17         +-------+-----------+-----------+------------+------------+ Bilateral ABIs appear essentially unchanged compared to prior study on 01/18/2024. Bilateral TBIs appear essentially unchanged compared to prior study on 01/18/2024.  Summary: Right: Resting right ankle-brachial index is within normal range. The right toe-brachial index is normal.  Left: Resting left ankle-brachial index is within normal range. The left toe-brachial index is normal.  *See table(s) above for measurements  and observations.  Electronically signed by Selinda Gu MD on 07/18/2024 at 9:04:25 AM.    Final    CT CHEST LUNG CA SCREEN LOW DOSE W/O CM Result Date: 06/29/2024 CLINICAL DATA:  Former 48 pack-year smoker, quit 2019. EXAM: CT CHEST WITHOUT CONTRAST LOW-DOSE FOR LUNG CANCER SCREENING TECHNIQUE: Multidetector CT imaging of the chest was performed following the standard protocol without IV contrast. RADIATION DOSE REDUCTION: This exam was performed according to the departmental dose-optimization program which includes automated exposure control, adjustment of the mA and/or kV according to patient size and/or use of iterative reconstruction technique. COMPARISON:  02/22/2022. FINDINGS: Cardiovascular: Atherosclerotic calcification of the aorta and aortic valve with age advanced  involvement of the left anterior descending coronary artery. Heart size normal. No pericardial effusion. Mediastinum/Nodes: No pathologically enlarged mediastinal or axillary lymph nodes. Hilar regions are difficult to definitively evaluate without IV contrast. Incidental note is made of a lipoma in the left lateral chest wall musculature. Esophagus is grossly unremarkable. Lungs/Pleura: Mild centrilobular emphysema. Vague areas of new peribronchovascular ground-glass bilaterally. No suspicious pulmonary nodules. No pleural fluid. Airway is unremarkable. Upper Abdomen: Visualized portions of the liver, gallbladder, adrenal glands, kidneys, spleen, pancreas, stomach and bowel are grossly unremarkable. No upper abdominal adenopathy. Musculoskeletal: Degenerative changes in the spine. IMPRESSION: 1. Lung-RADS 1, negative. Continue annual screening with low-dose chest CT without contrast in 12 months. 2. Scattered vague areas of new peribronchovascular ground-glass, indicative of an infectious/inflammatory etiology. 3. Age advanced left anterior descending coronary artery calcification. 4.  Aortic atherosclerosis (ICD10-I70.0). 5.  Emphysema (ICD10-J43.9). Electronically Signed   By: Newell Eke M.D.   On: 06/29/2024 14:36    Assessment/Plan  Type 2 diabetes mellitus with other specified complication (HCC) blood glucose control important in reducing the progression of atherosclerotic disease. Also, involved in wound healing. On appropriate medications.   Hyperlipidemia associated with type 2 diabetes mellitus (HCC) lipid control important in reducing the progression of atherosclerotic disease. Continue statin therapy   Hypertension associated with diabetes (HCC) blood pressure control important in reducing the progression of atherosclerotic disease. On appropriate oral medications.   Atherosclerosis of artery of extremity with rest pain (HCC) Symptoms markedly improved after extensive  revascularization. His ABIs today are 1.01 on the right and 1.22 on the left with triphasic waveforms and normal digital pressures bilaterally.  At this point, we can transition to an annual follow-up with duplex.  He will continue his Plavix .    Selinda Gu, MD  07/18/2024 10:35 AM    This note was created with Dragon medical transcription system.  Any errors from dictation are purely unintentional

## 2024-07-18 NOTE — Assessment & Plan Note (Signed)
 Symptoms markedly improved after extensive revascularization. His ABIs today are 1.01 on the right and 1.22 on the left with triphasic waveforms and normal digital pressures bilaterally.  At this point, we can transition to an annual follow-up with duplex.  He will continue his Plavix .

## 2024-07-18 NOTE — Assessment & Plan Note (Signed)
 blood pressure control important in reducing the progression of atherosclerotic disease. On appropriate oral medications.

## 2024-07-18 NOTE — Assessment & Plan Note (Signed)
 blood glucose control important in reducing the progression of atherosclerotic disease. Also, involved in wound healing. On appropriate medications.

## 2024-07-26 ENCOUNTER — Other Ambulatory Visit: Payer: Self-pay

## 2024-07-26 DIAGNOSIS — J449 Chronic obstructive pulmonary disease, unspecified: Secondary | ICD-10-CM

## 2024-07-26 MED ORDER — TRELEGY ELLIPTA 100-62.5-25 MCG/ACT IN AEPB: 1.0000 | INHALATION_SPRAY | Freq: Every day | RESPIRATORY_TRACT | 3 refills | Status: AC

## 2024-08-01 ENCOUNTER — Encounter: Payer: Self-pay | Admitting: Family Medicine

## 2024-08-01 ENCOUNTER — Ambulatory Visit: Admitting: Family Medicine

## 2024-08-01 VITALS — BP 136/71 | HR 80 | Temp 98.4°F | Ht 70.0 in | Wt 242.7 lb

## 2024-08-01 DIAGNOSIS — G8929 Other chronic pain: Secondary | ICD-10-CM

## 2024-08-01 DIAGNOSIS — Z23 Encounter for immunization: Secondary | ICD-10-CM | POA: Diagnosis not present

## 2024-08-01 DIAGNOSIS — F411 Generalized anxiety disorder: Secondary | ICD-10-CM

## 2024-08-01 DIAGNOSIS — J4520 Mild intermittent asthma, uncomplicated: Secondary | ICD-10-CM

## 2024-08-01 DIAGNOSIS — T560X1S Toxic effect of lead and its compounds, accidental (unintentional), sequela: Secondary | ICD-10-CM

## 2024-08-01 DIAGNOSIS — M5412 Radiculopathy, cervical region: Secondary | ICD-10-CM

## 2024-08-01 DIAGNOSIS — G4701 Insomnia due to medical condition: Secondary | ICD-10-CM

## 2024-08-01 DIAGNOSIS — I70229 Atherosclerosis of native arteries of extremities with rest pain, unspecified extremity: Secondary | ICD-10-CM

## 2024-08-01 DIAGNOSIS — G4733 Obstructive sleep apnea (adult) (pediatric): Secondary | ICD-10-CM

## 2024-08-01 DIAGNOSIS — E1169 Type 2 diabetes mellitus with other specified complication: Secondary | ICD-10-CM | POA: Diagnosis not present

## 2024-08-01 DIAGNOSIS — K219 Gastro-esophageal reflux disease without esophagitis: Secondary | ICD-10-CM

## 2024-08-01 DIAGNOSIS — E1159 Type 2 diabetes mellitus with other circulatory complications: Secondary | ICD-10-CM

## 2024-08-01 DIAGNOSIS — M1A00X0 Idiopathic chronic gout, unspecified site, without tophus (tophi): Secondary | ICD-10-CM

## 2024-08-01 DIAGNOSIS — I152 Hypertension secondary to endocrine disorders: Secondary | ICD-10-CM

## 2024-08-01 DIAGNOSIS — Z8619 Personal history of other infectious and parasitic diseases: Secondary | ICD-10-CM

## 2024-08-01 DIAGNOSIS — E119 Type 2 diabetes mellitus without complications: Secondary | ICD-10-CM

## 2024-08-01 DIAGNOSIS — I2510 Atherosclerotic heart disease of native coronary artery without angina pectoris: Secondary | ICD-10-CM

## 2024-08-01 DIAGNOSIS — J301 Allergic rhinitis due to pollen: Secondary | ICD-10-CM

## 2024-08-01 DIAGNOSIS — F3289 Other specified depressive episodes: Secondary | ICD-10-CM

## 2024-08-01 MED ORDER — VALACYCLOVIR HCL 500 MG PO TABS: 1000.0000 mg | ORAL_TABLET | ORAL | 2 refills | Status: DC | PRN

## 2024-08-01 MED ORDER — ATORVASTATIN CALCIUM 80 MG PO TABS: 80.0000 mg | ORAL_TABLET | Freq: Every day | ORAL | 3 refills | Status: AC

## 2024-08-01 NOTE — Patient Instructions (Signed)
 Please contact Dr. Rittman office to establish care  [facility]  [address], [place]. [address]  [phone]     To keep you healthy, please keep in mind the following health maintenance items that you are due for:   Health Maintenance Due  Topic Date Due   Hepatitis B Vaccines 19-59 Average Risk (2 of 3 - 3-dose series) 07/16/1966   Pneumococcal Vaccine: 50+ Years (2 of 2 - PCV) 08/25/2013     Best Wishes,   Dr. Lang

## 2024-08-01 NOTE — Progress Notes (Signed)
 Established patient visit   Patient: Kevin Huerta   DOB: [birth year]   58 y.o. Male  MRN: [id number] Visit Date: 08/01/2024  Today's healthcare provider: Rockie Agent, MD   Chief Complaint  Patient presents with   Establish Care    Patient presents to establish care with new pcp. Sees psychiatry, rheumatology, endocrinology, GI, cardiology, podiatry, vein vascular surgery and pulmonology Requests new referral for psychiatry closer to [facility]. Vaccines: Patient will receive prevnar, will do titer for hep B  Screenings: UTD    Subjective     HPI     Establish Care    Additional comments: Patient presents to establish care with new pcp. Sees psychiatry, rheumatology, endocrinology, GI, cardiology, podiatry, vein vascular surgery and pulmonology Requests new referral for psychiatry closer to [facility]. Vaccines: Patient will receive prevnar, will do titer for hep B  Screenings: UTD       Last edited by Cherry Chiquita HERO, CMA on 08/01/2024  3:41 PM.       Discussed the use of AI scribe software for clinical note transcription with the patient, who gave verbal consent to proceed.  History of Present Illness Kevin Huerta is a 58 year old male who presents for a transfer of care and requests a psychiatry referral for chronic depression and anxiety.  He experiences anxiety triggered by restrictive environments, such as small rooms without windows and traffic jams. 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 He is currently prescribed alprazolam  and trazodone  for anxiety management.  He has a history of asthma, managed with albuterol  two puffs every six hours as needed. He also has chronic allergic rhinitis, for which he uses Flonase  50 mcg, two sprays in each nostril daily, and Zyrtec 10 mg once daily, sometimes twice during seasonal changes.  He reports  chronic bilateral low back pain and cervical radiculopathy. He is prescribed Flexeril  5 mg as needed, though he is not currently taking it.  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  He has atherosclerosis of the aorta and chronic coronary artery disease, managed with Plavix  75 mg daily and atorvastatin  80 mg daily.  He has hyperlipidemia associated with type two diabetes, managed with atorvastatin  80 mg daily and canagliflozin-metformin  [phone] mg, two tablets daily. He uses [employer] sensors for diabetes management and takes glipizide  10 mg daily, Mounjaro 10 mg weekly, and Actos  30 mg daily.  He has obesity class one with a BMI of 34.8. He has insomnia, managed with trazodone , and chronic GERD, managed with Pepcid  40 mg daily and omeprazole .  He has a history of emphysema, managed with Trelegy 100-62.5-25 mcg daily, and a history of eczema.  He has a history of a food poisoning event earlier this year, which required hospitalization.  He has a history of vascular issues, having had stents placed in his leg last year, which improved his mobility and diabetes management. He reports increased physical activity since the procedure.  He has a history of sleep apnea, for which he was recently started on CPAP therapy.  He is prescribed Valtrex 1000 mg daily as needed and requests a refill.     Past Medical History:  Diagnosis Date   Allergic rhinitis, seasonal    Allergy August 2023   Aspirin    Anginal equivalent  Anxiety    a.) on BZO (alprazolam ) PRN   Aortic atherosclerosis    Arthritis    Asthma    Atherosclerosis of native artery of both lower extremities with intermittent claudication    a.) s/p PTA of RIGHT prox peroneal, prox SFA, popliteal, and distal SFA; 6 mm x 15 cm Viabahn stent to RIGHT distal SFA and above knee popliteal artery; b.) s/p  unsuccessful PTA of occluded SFA 03/22/2023 --> plans for endarterectomy +/-  endoluminal intervention   BRBPR (bright red blood per rectum) 05/24/2020   Chest heaviness 10/27/2022   Coronary artery disease of native artery of native heart with stable angina pectoris 03/29/2023   a.) cCTA 03/29/2023: Ca2+ 476 (95th percentile for age/sex/race matched control)   Current vaping on some days    DDD (degenerative disc disease), cervical    a.) s/p ACDF C3-C5 2014   Dermatitis 06/02/2021   Diarrhea 02/26/2021   Emphysema lung (HCC)    Fatty liver 09/03/2020   GERD (gastroesophageal reflux disease)    Gout    Heart murmur    History of cardiac catheterization 11/27/2008   a.) LHC 11/27/2008 at [facility] --> normal coronary anatomy with no obstructive CAD   Hyperlipidemia    Hypersomnia 07/17/2014   Hypertension    Insomnia    a.) uses melatonin +/- trazodone  PRN   Leg cramps 02/26/2021   Marijuana use, episodic    Occasional alcohol consumption    On long term clopidogrel  therapy    OSA (obstructive sleep apnea)    a.) not currently on nocturnal PAP therapy; needs repeat PSG   Seasonal allergies    Sleep apnea 2025   T2DM (type 2 diabetes mellitus) (HCC)    a.) monitors with Freestyle Libre CGM   Tinnitus     Medications: Outpatient Medications Prior to Visit  Medication Sig   albuterol  (VENTOLIN  HFA) 108 (90 Base) MCG/ACT inhaler USE 2 INHALATIONS EVERY 6 HOURS AS NEEDED FOR WHEEZING OR SHORTNESS OF BREATH   allopurinol  (ZYLOPRIM ) 100 MG tablet Take 200 mg by mouth daily.   ALPRAZolam  (XANAX ) 0.5 MG tablet Take 0.5 mg by mouth as needed.   Blood Glucose Monitoring Suppl (ONETOUCH VERIO FLEX SYSTEM) w/Device KIT by Does not apply route.   Canagliflozin-metFORMIN  HCl ER (INVOKAMET XR) [phone] MG TB24 Take 2 tablets by mouth daily.   cetirizine (ZYRTEC) 10 MG tablet Take 10 mg by mouth 2 (two) times daily.   clopidogrel  (PLAVIX ) 75 MG tablet Take 1 tablet (75 mg  total) by mouth daily.   clotrimazole -betamethasone  (LOTRISONE ) cream APPLY 1 APPLICATION TOPICALLY DAILY   colchicine 0.6 MG tablet Take 0.6 mg by mouth 2 (two) times daily as needed.   Continuous Blood Gluc Sensor (FREESTYLE LIBRE 2 SENSOR) MISC USE TO CHECK GLUCOSE THREE TIMES A DAY   famotidine  (PEPCID ) 40 MG tablet Take 40 mg by mouth daily.   fluticasone  (FLONASE ) 50 MCG/ACT nasal spray Place 2 sprays into both nostrils daily. (Patient taking differently: Place 2 sprays into both nostrils daily as needed.)   Fluticasone -Umeclidin-Vilant (TRELEGY ELLIPTA ) 100-62.5-25 MCG/ACT AEPB Inhale 1 puff into the lungs daily.   glipiZIDE  (GLUCOTROL  XL) 10 MG 24 hr tablet Take 10 mg by mouth.   ipratropium (ATROVENT ) 0.06 % nasal spray Place 2 sprays into both nostrils 4 (four) times daily.   ketoconazole  (NIZORAL ) 2 % cream Apply to both feet and between toes once daily for 6 weeks.   Melatonin 10 MG CAPS Take 10 mg by  mouth every evening.   MOUNJARO 10 MG/0.5ML Pen Inject 10 mg into the skin once a week.   Multiple Vitamin (MULTI-VITAMIN) tablet Take 1 tablet by mouth daily.   omeprazole  (PRILOSEC) 40 MG capsule Take 40 mg by mouth daily.   OVER THE COUNTER MEDICATION Take 600 mg by mouth daily. Goli Ashwagandha Gummies   pioglitazone  (ACTOS ) 30 MG tablet Take 30 mg by mouth daily.   Probiotic Product (PROBIOTIC DAILY PO) Take 1 capsule by mouth daily.   Semaglutide, 2 MG/DOSE, (OZEMPIC, 2 MG/DOSE,) 8 MG/3ML SOPN Inject 2 mg into the skin once a week.   SYNJARDY  XR 12.02-999 MG TB24 Take 2 tablets by mouth daily before breakfast.   traZODone  (DESYREL ) 100 MG tablet Take 100 mg by mouth at bedtime.   valsartan  (DIOVAN ) 320 MG tablet Take 1 tablet (320 mg total) by mouth daily.   [DISCONTINUED] atorvastatin  (LIPITOR) 80 MG tablet TAKE 1 TABLET BY MOUTH EVERY DAY   [DISCONTINUED] cyclobenzaprine  (FLEXERIL ) 5 MG tablet Take 1 tablet (5 mg total) by mouth 3 (three) times daily as needed for muscle  spasms.   [DISCONTINUED] ondansetron  (ZOFRAN -ODT) 4 MG disintegrating tablet Take 1 tablet (4 mg total) by mouth every 8 (eight) hours as needed for nausea or vomiting.   [DISCONTINUED] pantoprazole  (PROTONIX ) 40 MG tablet Take 1 tablet (40 mg total) by mouth daily.   [DISCONTINUED] valACYclovir (VALTREX) 500 MG tablet Take 1,000 mg by mouth as needed.   GAVILAX 17 GM/SCOOP powder Take 510 g by mouth daily.   No facility-administered medications prior to visit.    Review of Systems  Last CBC Lab Results  Component Value Date   WBC 5.0 03/08/2024   HGB 15.6 03/08/2024   HCT 49.4 03/08/2024   MCV 82.9 03/08/2024   MCH 26.2 03/08/2024   RDW 15.3 03/08/2024   PLT 276 03/08/2024   Last metabolic panel Lab Results  Component Value Date   GLUCOSE 193 (H) 03/08/2024   NA 137 03/08/2024   K 4.0 03/08/2024   CL 106 03/08/2024   CO2 21 (L) 03/08/2024   BUN 26 (H) 03/08/2024   CREATININE 1.01 03/08/2024   GFRNONAA >60 03/08/2024   CALCIUM  8.5 (L) 03/08/2024   PROT 6.8 03/08/2024   ALBUMIN  3.9 03/08/2024   BILITOT 0.9 03/08/2024   ALKPHOS 67 03/08/2024   AST 29 03/08/2024   ALT 29 03/08/2024   ANIONGAP 10 03/08/2024   Last lipids Lab Results  Component Value Date   CHOL 133 05/26/2024   HDL 50.10 05/26/2024   LDLCALC 71 05/26/2024   TRIG 57.0 05/26/2024   CHOLHDL 3 05/26/2024   Last hemoglobin A1c Lab Results  Component Value Date   HGBA1C 7.4 (A) 05/22/2024   Last thyroid functions No results found for: TSH, T3TOTAL, T4TOTAL, FREET4, THYROIDAB Last vitamin D Lab Results  Component Value Date   VD25OH 34.82 09/03/2020   Last vitamin B12 and Folate Lab Results  Component Value Date   VITAMINB12 782 09/03/2020   FOLATE >23.6 09/03/2020        Objective    BP 136/71 (BP Location: Left Arm, Patient Position: Sitting, Cuff Size: Normal)   Pulse 80   Temp 98.4 F (36.9 C) (Oral)   Ht 5' 10 (1.778 m)   Wt 242 lb 11.2 oz (110.1 kg)   SpO2 100%    BMI 34.82 kg/m   BP Readings from Last 3 Encounters:  08/01/24 136/71  07/18/24 138/89  05/22/24 136/74   Wt Readings from  Last 3 Encounters:  08/01/24 242 lb 11.2 oz (110.1 kg)  07/18/24 240 lb 9.6 oz (109.1 kg)  07/10/24 244 lb (110.7 kg)        Physical Exam  Physical Exam MEASUREMENTS: BMI- 34.8. HEENT: Normal oropharynx, moist mucous membranes NECK: Supple, no nuchal rigidity, thyroid non-tender, no thyromegaly CHEST: Clear to auscultation bilaterally, no wheezes, rhonchi, or crackles CARDIOVASCULAR: Peripheral pulses intact and symmetric ABDOMEN: Soft, non-tender, non-distended, without organomegaly EXTREMITIES: No edema, no tenderness    No results found for any visits on 08/01/24.  Assessment & Plan     Problem List Items Addressed This Visit     Allergic rhinitis   Chronic allergic rhinitis Chronic allergic rhinitis managed with Flonase  and Zyrtec. Symptoms are seasonal and exacerbated by dust. - Continue Flonase  50 mcg, two sprays each nostril daily - Continue Zyrtec 10 mg once daily, increase to twice daily if needed during high allergen seasons      Asthma   Chronic condition  Continue to follow up with pulm  Continue Trelegy 100-62.5-25mcg, 1 puff daily       Atherosclerosis of artery of extremity with rest pain (HCC)   Atherosclerotic heart disease and aortic atherosclerosis Chronic atherosclerotic heart disease and aortic atherosclerosis. Managed with Plavix  and atorvastatin  to reduce the risk of cardiovascular events. - Continue Plavix  75 mg daily - Continue atorvastatin  80 mg daily -f/u with vascular surgery as scheduled       Relevant Medications   atorvastatin  (LIPITOR) 80 MG tablet   Cervical radiculopathy    Chronic low back pain and cervical radiculopathy Chronic low back pain and cervical radiculopathy. No current use of muscle relaxants reported.      Chronic bilateral low back pain with right-sided sciatica   Chronic gouty  arthropathy 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   Coronary artery disease   Chronic  F/u with cardiology  Continue atorvastatin  80mg  daily      Relevant Medications   atorvastatin  (LIPITOR) 80 MG tablet   Depression   Gastroesophageal reflux disease   Gastroesophageal reflux disease (GERD) Chronic GERD managed with omeprazole  and Pepcid . Recent endoscopy performed, and current medication regimen adjusted post-procedure. - Continue omeprazole  40 mg before dinner - Continue Pepcid  40 mg at night      Relevant Medications   GAVILAX 17 GM/SCOOP powder   omeprazole  (PRILOSEC) 40 MG capsule   famotidine  (PEPCID ) 40 MG tablet   Generalized anxiety disorder    Generalized anxiety disorder Chronic generalized anxiety disorder managed with alprazolam  and trazodone . Experiences anxiety in restrictive environments and is seeking a new psychiatrist closer to home. - Continue alprazolam  as needed - Provide referral to Dr. Justino Drone for psychiatry      Gout   Chronic gout Chronic gout managed with allopurinol  and colchicine. No recent flares reported. - Continue allopurinol  200 mg daily - Use colchicine as needed for gout flares      Hyperlipidemia associated with type 2 diabetes mellitus (HCC)   Chronic  F/u with cardiology  Continue atorvastatin  80mg  daily       Relevant Medications   atorvastatin  (LIPITOR) 80 MG tablet   MOUNJARO 10 MG/0.5ML Pen   Canagliflozin-metFORMIN  HCl ER (INVOKAMET XR) [phone] MG TB24   Hypertension associated with diabetes (HCC)   Chronic  Well controlled  Continue valsartan  320mg  daily       Relevant Medications   atorvastatin  (LIPITOR) 80 MG tablet   MOUNJARO 10 MG/0.5ML Pen   Canagliflozin-metFORMIN  HCl ER (INVOKAMET XR) [phone] MG TB24  Insomnia    Insomnia Chronic condition  Insomnia managed with trazodone . - Continue trazodone  100 mg daily      OSA (obstructive sleep apnea)   Chronic  Managed by pulm Stable with CPAP use  Patient advised to  continue CPAP nightly  The patient has had significant benefit from the use of CPAP machine with considerable improvements in quality of life, work production and decrease medical complaints.  The patient will need continued maintenance and care CPAP supplies (including upgrades as deemed appropriate) in order to continue treatment for sleep apnea as this is medically necessary.         Type 2 diabetes mellitus with other specified complication (HCC)   Type 2 diabetes mellitus with associated hypertension and hyperlipidemia Chronic type 2 diabetes mellitus with associated hypertension and hyperlipidemia. Blood pressure is well controlled at 136/71 mmHg. Diabetes management includes multiple medications and the use of Freestyle Libre sensors. Transitioning from Ozempic to Mounjaro for better weight management and potential improvement in sleep apnea. - Continue canagliflozin and metformin  [phone] mg, two tablets daily - Continue glipizide  10 mg daily - Continue Actos  30 mg daily - Continue Synjardy  25 mg/2000 mg, two tablets daily - Continue valsartan  320 mg daily for hypertension - Continue atorvastatin  80 mg daily for hyperlipidemia - Continue Freestyle Libre sensors for glucose monitoring - Transition from Ozempic to Mounjaro after completing current Ozempic supply -Follow up with endocrinology       Relevant Medications   atorvastatin  (LIPITOR) 80 MG tablet   MOUNJARO 10 MG/0.5ML Pen   Canagliflozin-metFORMIN  HCl ER (INVOKAMET XR) [phone] MG TB24   Other Visit Diagnoses       Type 2 diabetes mellitus without complication, without long-term current use of insulin  (HCC)    -  Primary   Relevant Medications   atorvastatin  (LIPITOR) 80 MG tablet   MOUNJARO 10 MG/0.5ML Pen   Canagliflozin-metFORMIN  HCl ER (INVOKAMET XR) [phone] MG TB24   Other Relevant Orders   Pneumococcal conjugate vaccine 20-valent (Prevnar 20) (Completed)     Need for pneumococcal vaccine         H/O cold  sores       Relevant Medications   valACYclovir (VALTREX) 500 MG tablet     Immunization due       Relevant Orders   Heplisav-B (HepB-CPG) Vaccine (Completed)        Assessment & Plan   Obesity, class 1 Class 1 obesity with a BMI of 34.8. Weight management is part of the diabetes treatment plan, with a transition to Mounjaro expected to aid in weight loss. - Transition from Ozempic to Mounjaro after completing current Ozempic supply          Return in about 6 months (around 01/30/2025) for CPE.         Rockie Agent, MD  [facility] [phone] (phone) [phone] (fax)  [facility]

## 2024-08-02 DIAGNOSIS — K219 Gastro-esophageal reflux disease without esophagitis: Secondary | ICD-10-CM | POA: Insufficient documentation

## 2024-08-02 NOTE — Assessment & Plan Note (Signed)
  Insomnia Chronic condition  Insomnia managed with trazodone . - Continue trazodone  100 mg daily

## 2024-08-02 NOTE — Assessment & Plan Note (Signed)
 Chronic gout Chronic gout managed with allopurinol  and colchicine. No recent flares reported. - Continue allopurinol  200 mg daily - Use colchicine as needed for gout flares

## 2024-08-02 NOTE — Assessment & Plan Note (Signed)
 Type 2 diabetes mellitus with associated hypertension and hyperlipidemia Chronic type 2 diabetes mellitus with associated hypertension and hyperlipidemia. Blood pressure is well controlled at 136/71 mmHg. Diabetes management includes multiple medications and the use of Freestyle Libre sensors. Transitioning from Ozempic to Mounjaro for better weight management and potential improvement in sleep apnea. - Continue canagliflozin and metformin  [phone] mg, two tablets daily - Continue glipizide  10 mg daily - Continue Actos  30 mg daily - Continue Synjardy  25 mg/2000 mg, two tablets daily - Continue valsartan  320 mg daily for hypertension - Continue atorvastatin  80 mg daily for hyperlipidemia - Continue Freestyle Libre sensors for glucose monitoring - Transition from Ozempic to Mounjaro after completing current Ozempic supply -Follow up with endocrinology

## 2024-08-02 NOTE — Assessment & Plan Note (Signed)
 Chronic allergic rhinitis Chronic allergic rhinitis managed with Flonase  and Zyrtec. Symptoms are seasonal and exacerbated by dust. - Continue Flonase  50 mcg, two sprays each nostril daily - Continue Zyrtec 10 mg once daily, increase to twice daily if needed during high allergen seasons

## 2024-08-02 NOTE — Assessment & Plan Note (Signed)
 Chronic  Managed by pulm Stable with CPAP use  Patient advised to continue CPAP nightly  The patient has had significant benefit from the use of CPAP machine with considerable improvements in quality of life, work production and decrease medical complaints.  The patient will need continued maintenance and care CPAP supplies (including upgrades as deemed appropriate) in order to continue treatment for sleep apnea as this is medically necessary.

## 2024-08-02 NOTE — Assessment & Plan Note (Signed)
 Chronic condition  Continue to follow up with pulm  Continue Trelegy 100-62.5-25mcg, 1 puff daily

## 2024-08-02 NOTE — Assessment & Plan Note (Signed)
 Gastroesophageal reflux disease (GERD) Chronic GERD managed with omeprazole  and Pepcid . Recent endoscopy performed, and current medication regimen adjusted post-procedure. - Continue omeprazole  40 mg before dinner - Continue Pepcid  40 mg at night

## 2024-08-02 NOTE — Assessment & Plan Note (Signed)
Chronic.  Well controlled.  Continue valsartan 320 mg daily.

## 2024-08-02 NOTE — Assessment & Plan Note (Signed)
  Generalized anxiety disorder Chronic generalized anxiety disorder managed with alprazolam  and trazodone . Experiences anxiety in restrictive environments and is seeking a new psychiatrist closer to home. - Continue alprazolam  as needed - Provide referral to Dr. Justino Drone for psychiatry

## 2024-08-02 NOTE — Assessment & Plan Note (Signed)
 Chronic  F/u with cardiology  Continue atorvastatin  80mg  daily

## 2024-08-02 NOTE — Assessment & Plan Note (Signed)
 Atherosclerotic heart disease and aortic atherosclerosis Chronic atherosclerotic heart disease and aortic atherosclerosis. Managed with Plavix  and atorvastatin  to reduce the risk of cardiovascular events. - Continue Plavix  75 mg daily - Continue atorvastatin  80 mg daily -f/u with vascular surgery as scheduled

## 2024-08-02 NOTE — Assessment & Plan Note (Signed)
  Chronic low back pain and cervical radiculopathy Chronic low back pain and cervical radiculopathy. No current use of muscle relaxants reported.

## 2024-08-03 ENCOUNTER — Other Ambulatory Visit: Payer: Self-pay | Admitting: Podiatry

## 2024-08-03 DIAGNOSIS — B353 Tinea pedis: Secondary | ICD-10-CM

## 2024-09-14 ENCOUNTER — Encounter: Payer: Self-pay | Admitting: Podiatry

## 2024-09-14 ENCOUNTER — Ambulatory Visit: Admitting: Podiatry

## 2024-09-14 DIAGNOSIS — B351 Tinea unguium: Secondary | ICD-10-CM | POA: Diagnosis not present

## 2024-09-14 DIAGNOSIS — M79675 Pain in left toe(s): Secondary | ICD-10-CM | POA: Diagnosis not present

## 2024-09-14 DIAGNOSIS — L84 Corns and callosities: Secondary | ICD-10-CM

## 2024-09-14 DIAGNOSIS — E1151 Type 2 diabetes mellitus with diabetic peripheral angiopathy without gangrene: Secondary | ICD-10-CM | POA: Diagnosis not present

## 2024-09-14 DIAGNOSIS — M79674 Pain in right toe(s): Secondary | ICD-10-CM | POA: Diagnosis not present

## 2024-09-14 NOTE — Progress Notes (Signed)
 Subjective:  Patient ID: Kevin Huerta, male    DOB: December 16, 1965,  MRN: [id number]  Kevin Huerta presents to clinic today for at risk foot care. Pt has h/o NIDDM with PAD and callus(es) right foot and painful mycotic toenails that are difficult to trim. Painful toenails interfere with ambulation. Aggravating factors include wearing enclosed shoe gear. Pain is relieved with periodic professional debridement. Painful calluses are aggravated when weightbearing with and without shoegear. Pain is relieved with periodic professional debridement. He will be traveling to the beach on Sunday. Chief Complaint  Patient presents with   Nail Problem    Thick painful toenails, 3 month follow up    Diabetes    A1C 7.4   New problem(s): None.   PCP is Simmons-Robinson, Rockie, MD. ARNETTA 08/01/24.  Allergies[1]  Review of Systems: Negative except as noted in the HPI.  Objective: No changes noted in today's physical examination. There were no vitals filed for this visit. Kevin Huerta is a pleasant 58 y.o. male obese in NAD. AAO x 3.  Vascular Examination: Capillary refill time immediate b/l. Vascular status intact b/l with palpable pedal pulses. Pedal hair present b/l. No pain with calf compression b/l. Skin temperature gradient WNL b/l. No cyanosis or clubbing b/l. No ischemia or gangrene noted b/l. No edema noted b/l LE.  Neurological Examination: Sensation grossly intact b/l with 10 gram monofilament. Vibratory sensation intact b/l.   Dermatological Examination: Pedal skin with normal turgor, texture and tone b/l.  No open wounds. No interdigital macerations.   Residual scaling noted plantar and peripheral aspect of both feet.  Toenails 1-5 b/l thick, discolored, elongated with subungual debris and pain on dorsal palpation.   Hyperkeratotic lesion(s) medial IPJ of right great toe.  No erythema, no edema, no drainage, no fluctuance.  Musculoskeletal Examination: Muscle strength 5/5 to all  lower extremity muscle groups bilaterally. HAV with bunion deformity noted b/l LE. Pes planus deformity noted bilateral LE.  Radiographs: None  Lab Results  Component Value Date   HGBA1C 7.4 (A) 05/22/2024   HGBA1C 7.9 01/19/2024   HGBA1C 9.1 (H) 08/04/2023   Assessment/Plan: 1. Pain due to onychomycosis of toenails of both feet [B35.1, M79.675, M79.674]   2. Callus   3. Type II diabetes mellitus with peripheral circulatory disorder [facility])    Consent given for treatment. Patient examined.All patient's and/or POA's questions/concerns addressed on today's visit. Toenails 1-5 b/l debrided in length and girth without incident. Callus(es) medial IPJ of right great toe pared with sharp debridement without incident. Continue foot and shoe inspections daily. Monitor blood glucose per PCP/Endocrinologist's recommendations.Continue soft, supportive shoe gear daily. Report any pedal injuries to medical professional. Call office if there are any questions/concerns. -Patient/POA to call should there be question/concern in the interim.   Return in about 3 months (around 12/13/2024).  Kevin Huerta, DPM      [place] LOCATION: [address], [place] [address]                   Office [phone]   [facility] LOCATION: [address] [place], [place] [address] Office [phone]      [1]  Allergies Allergen Reactions   Aspirin  Tinitus

## 2024-09-25 ENCOUNTER — Other Ambulatory Visit: Payer: Self-pay

## 2024-09-25 ENCOUNTER — Encounter: Payer: Self-pay | Admitting: Family Medicine

## 2024-09-25 DIAGNOSIS — F411 Generalized anxiety disorder: Secondary | ICD-10-CM

## 2024-10-23 ENCOUNTER — Telehealth: Payer: Self-pay | Admitting: Family Medicine

## 2024-10-23 ENCOUNTER — Other Ambulatory Visit: Payer: Self-pay

## 2024-10-23 DIAGNOSIS — Z8619 Personal history of other infectious and parasitic diseases: Secondary | ICD-10-CM

## 2024-10-23 MED ORDER — VALACYCLOVIR HCL 500 MG PO TABS: 1000.0000 mg | ORAL_TABLET | ORAL | 2 refills | Status: DC | PRN

## 2024-10-23 NOTE — Telephone Encounter (Signed)
 [facility] faxed refill request for the following medications:  valACYclovir  (VALTREX ) 500 MG tablet     Please advise.

## 2024-10-26 ENCOUNTER — Telehealth: Payer: Self-pay | Admitting: Family Medicine

## 2024-10-26 DIAGNOSIS — Z8619 Personal history of other infectious and parasitic diseases: Secondary | ICD-10-CM

## 2024-10-26 MED ORDER — VALACYCLOVIR HCL 500 MG PO TABS: 1000.0000 mg | ORAL_TABLET | ORAL | 1 refills | Status: DC | PRN

## 2024-10-26 NOTE — Telephone Encounter (Signed)
 [facility] faxed refill request for the following medications:  valACYclovir  (VALTREX ) 500 MG tablet     Please advise.

## 2024-11-02 ENCOUNTER — Other Ambulatory Visit: Payer: Self-pay | Admitting: Family Medicine

## 2024-11-02 DIAGNOSIS — Z8619 Personal history of other infectious and parasitic diseases: Secondary | ICD-10-CM

## 2024-11-02 MED ORDER — VALACYCLOVIR HCL 500 MG PO TABS: 1000.0000 mg | ORAL_TABLET | ORAL | 1 refills | Status: AC | PRN

## 2024-11-09 LAB — OPHTHALMOLOGY REPORT-SCANNED

## 2024-12-05 ENCOUNTER — Ambulatory Visit: Admitting: Family Medicine

## 2024-12-15 ENCOUNTER — Ambulatory Visit: Admitting: Podiatry

## 2025-07-17 ENCOUNTER — Encounter (INDEPENDENT_AMBULATORY_CARE_PROVIDER_SITE_OTHER)

## 2025-07-17 ENCOUNTER — Ambulatory Visit (INDEPENDENT_AMBULATORY_CARE_PROVIDER_SITE_OTHER): Admitting: Vascular Surgery
